# Patient Record
Sex: Female | Born: 1965 | Race: White | Hispanic: No | Marital: Married | Smoking: Never smoker
Health system: Southern US, Community
[De-identification: ages and names within clinical notes are randomized; demographics above are authoritative.]

## PROBLEM LIST (undated history)

## (undated) DIAGNOSIS — R5382 Chronic fatigue, unspecified: Secondary | ICD-10-CM

## (undated) DIAGNOSIS — E663 Overweight: Secondary | ICD-10-CM

## (undated) DIAGNOSIS — M19041 Primary osteoarthritis, right hand: Secondary | ICD-10-CM

## (undated) DIAGNOSIS — M5431 Sciatica, right side: Secondary | ICD-10-CM

## (undated) DIAGNOSIS — M19042 Primary osteoarthritis, left hand: Principal | ICD-10-CM

## (undated) DIAGNOSIS — C801 Malignant (primary) neoplasm, unspecified: Secondary | ICD-10-CM

## (undated) DIAGNOSIS — E785 Hyperlipidemia, unspecified: Secondary | ICD-10-CM

## (undated) DIAGNOSIS — J45909 Unspecified asthma, uncomplicated: Secondary | ICD-10-CM

## (undated) DIAGNOSIS — E039 Hypothyroidism, unspecified: Secondary | ICD-10-CM

## (undated) DIAGNOSIS — E079 Disorder of thyroid, unspecified: Secondary | ICD-10-CM

## (undated) HISTORY — DX: Sciatica, right side: M54.31

## (undated) HISTORY — PX: LEEP: SHX91

## (undated) HISTORY — DX: Primary osteoarthritis, right hand: M19.041

## (undated) HISTORY — PX: FRACTURE SURGERY: SHX138

## (undated) HISTORY — DX: Disorder of thyroid, unspecified: E07.9

## (undated) HISTORY — DX: Hyperlipidemia, unspecified: E78.5

## (undated) HISTORY — DX: Overweight: E66.3

## (undated) HISTORY — DX: Chronic fatigue, unspecified: R53.82

## (undated) HISTORY — PX: JOINT REPLACEMENT: SHX530

## (undated) HISTORY — DX: Primary osteoarthritis, left hand: M19.042

---

## 1994-12-26 HISTORY — PX: MANDIBLE FRACTURE SURGERY: SHX706

## 1999-03-05 ENCOUNTER — Other Ambulatory Visit: Admission: RE | Admit: 1999-03-05 | Discharge: 1999-03-05 | Payer: Self-pay | Admitting: Obstetrics and Gynecology

## 2005-04-15 ENCOUNTER — Ambulatory Visit: Payer: Self-pay | Admitting: Neurology

## 2005-04-18 ENCOUNTER — Ambulatory Visit: Payer: Self-pay | Admitting: Neurology

## 2007-04-12 ENCOUNTER — Ambulatory Visit: Payer: Self-pay | Admitting: Family Medicine

## 2007-05-08 DIAGNOSIS — G9332 Myalgic encephalomyelitis/chronic fatigue syndrome: Secondary | ICD-10-CM

## 2007-05-08 DIAGNOSIS — R5382 Chronic fatigue, unspecified: Secondary | ICD-10-CM

## 2007-05-08 DIAGNOSIS — D8989 Other specified disorders involving the immune mechanism, not elsewhere classified: Secondary | ICD-10-CM

## 2007-05-08 HISTORY — DX: Myalgic encephalomyelitis/chronic fatigue syndrome: G93.32

## 2008-11-26 ENCOUNTER — Ambulatory Visit: Payer: Self-pay | Admitting: Family Medicine

## 2008-12-05 ENCOUNTER — Ambulatory Visit: Payer: Self-pay | Admitting: Family Medicine

## 2010-01-14 ENCOUNTER — Ambulatory Visit: Payer: Self-pay | Admitting: Family Medicine

## 2010-01-19 ENCOUNTER — Ambulatory Visit: Payer: Self-pay | Admitting: Family Medicine

## 2010-07-19 ENCOUNTER — Ambulatory Visit: Payer: Self-pay | Admitting: Family Medicine

## 2011-07-06 ENCOUNTER — Ambulatory Visit: Payer: Self-pay | Admitting: Family Medicine

## 2012-08-30 ENCOUNTER — Ambulatory Visit: Payer: Self-pay | Admitting: Family Medicine

## 2013-02-19 LAB — HM PAP SMEAR: HM Pap smear: NORMAL

## 2014-03-11 LAB — LIPID PANEL
Cholesterol: 237 mg/dL — AB (ref 0–200)
HDL: 48 mg/dL (ref 35–70)
LDL Cholesterol: 132 mg/dL
Triglycerides: 284 mg/dL — AB (ref 40–160)

## 2014-03-11 LAB — HEMOGLOBIN A1C: Hgb A1c MFr Bld: 5.7 % (ref 4.0–6.0)

## 2014-05-27 ENCOUNTER — Ambulatory Visit: Payer: Self-pay | Admitting: Family Medicine

## 2014-05-28 LAB — HM MAMMOGRAPHY: HM Mammogram: NORMAL

## 2014-11-17 ENCOUNTER — Encounter: Payer: Self-pay | Admitting: General Surgery

## 2014-11-26 ENCOUNTER — Ambulatory Visit: Payer: Self-pay | Admitting: General Surgery

## 2014-12-03 ENCOUNTER — Encounter: Payer: Self-pay | Admitting: General Surgery

## 2014-12-03 ENCOUNTER — Ambulatory Visit (INDEPENDENT_AMBULATORY_CARE_PROVIDER_SITE_OTHER): Payer: Managed Care, Other (non HMO) | Admitting: General Surgery

## 2014-12-03 DIAGNOSIS — D1720 Benign lipomatous neoplasm of skin and subcutaneous tissue of unspecified limb: Secondary | ICD-10-CM | POA: Insufficient documentation

## 2014-12-03 HISTORY — DX: Benign lipomatous neoplasm of skin and subcutaneous tissue of unspecified limb: D17.20

## 2014-12-03 NOTE — Patient Instructions (Signed)
The patient is aware to call back for any questions or concerns.  
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
## 2015-07-04 ENCOUNTER — Encounter: Payer: Self-pay | Admitting: Family Medicine

## 2015-07-04 DIAGNOSIS — E663 Overweight: Secondary | ICD-10-CM | POA: Insufficient documentation

## 2015-07-04 DIAGNOSIS — R209 Unspecified disturbances of skin sensation: Secondary | ICD-10-CM | POA: Insufficient documentation

## 2015-07-04 DIAGNOSIS — R232 Flushing: Secondary | ICD-10-CM | POA: Insufficient documentation

## 2015-07-04 DIAGNOSIS — E785 Hyperlipidemia, unspecified: Secondary | ICD-10-CM | POA: Insufficient documentation

## 2015-07-04 DIAGNOSIS — E039 Hypothyroidism, unspecified: Secondary | ICD-10-CM | POA: Insufficient documentation

## 2015-07-04 HISTORY — DX: Unspecified disturbances of skin sensation: R20.9

## 2015-07-07 ENCOUNTER — Ambulatory Visit (INDEPENDENT_AMBULATORY_CARE_PROVIDER_SITE_OTHER): Payer: Managed Care, Other (non HMO) | Admitting: Family Medicine

## 2015-07-07 ENCOUNTER — Encounter: Payer: Self-pay | Admitting: Family Medicine

## 2015-07-07 VITALS — BP 118/72 | HR 70 | Temp 98.4°F | Resp 18 | Ht 66.0 in | Wt 173.5 lb

## 2015-07-07 DIAGNOSIS — E785 Hyperlipidemia, unspecified: Secondary | ICD-10-CM

## 2015-07-07 DIAGNOSIS — R142 Eructation: Secondary | ICD-10-CM | POA: Diagnosis not present

## 2015-07-07 DIAGNOSIS — Z0000 Encounter for general adult medical examination without abnormal findings: Secondary | ICD-10-CM | POA: Diagnosis not present

## 2015-07-07 DIAGNOSIS — Z1239 Encounter for other screening for malignant neoplasm of breast: Secondary | ICD-10-CM

## 2015-07-07 DIAGNOSIS — Z124 Encounter for screening for malignant neoplasm of cervix: Secondary | ICD-10-CM

## 2015-07-07 DIAGNOSIS — R5383 Other fatigue: Secondary | ICD-10-CM

## 2015-07-07 DIAGNOSIS — R141 Gas pain: Secondary | ICD-10-CM

## 2015-07-07 DIAGNOSIS — Z1211 Encounter for screening for malignant neoplasm of colon: Secondary | ICD-10-CM | POA: Diagnosis not present

## 2015-07-07 DIAGNOSIS — R140 Abdominal distension (gaseous): Secondary | ICD-10-CM | POA: Diagnosis not present

## 2015-07-07 DIAGNOSIS — R143 Flatulence: Secondary | ICD-10-CM | POA: Diagnosis not present

## 2015-07-07 DIAGNOSIS — R1031 Right lower quadrant pain: Secondary | ICD-10-CM | POA: Diagnosis not present

## 2015-07-07 DIAGNOSIS — E038 Other specified hypothyroidism: Secondary | ICD-10-CM | POA: Diagnosis not present

## 2015-07-07 DIAGNOSIS — Z01419 Encounter for gynecological examination (general) (routine) without abnormal findings: Secondary | ICD-10-CM

## 2015-07-07 LAB — HM PAP SMEAR: HM PAP: NEGATIVE

## 2015-07-07 MED ORDER — LEVOTHYROXINE SODIUM 100 MCG PO TABS: 100.0000 ug | ORAL_TABLET | Freq: Every day | ORAL | Status: DC

## 2015-07-07 NOTE — Progress Notes (Signed)
Name: Monica Hardin   MRN: [id number]    DOB: February 24, 1966   Date:07/07/2015       Progress Note  Subjective  Chief Complaint  Chief Complaint  Patient presents with  . Annual Exam    HPI  Well Woman: she is still having monthly cycles, but not as heavy and lasts about three days. She has chronic fatigue, but is very concerned about abdominal bloating, and RLQ pain that she has noticed over the past three months. At times it radiates to LLQ and her lower back.  She denies blood in stools and states occasionally gets constipated but not out of ordinary for her. She denies nausea or vomiting. She has been afebrile. She went to Urgent Care about 3 weeks ago and had neg urine culture .  No dysuria, no vaginal discharge.    Patient Active Problem List   Diagnosis Date Noted  . Dyslipidemia 07/04/2015  . Adult hypothyroidism 07/04/2015  . Excess weight 07/04/2015  . Female climacteric state 07/04/2015  . Disturbance of skin sensation 07/04/2015  . Lipoma of arm 12/03/2014  . CFIDS (chronic fatigue and immune dysfunction syndrome) 05/08/2007    History reviewed. No pertinent past surgical history.  Family History  Problem Relation Age of Onset  . Cancer Mother   . Cancer Father   . Kidney Stones Daughter   . Asthma Son     History   Social History  . Marital Status: Married    Spouse Name: Monica Hardin  . Number of Children: Monica Hardin  . Years of Education: Monica Hardin   Occupational History  . Not on file.   Social History Main Topics  . Smoking status: Never Smoker   . Smokeless tobacco: Never Used  . Alcohol Use: No  . Drug Use: No  . Sexual Activity: Yes   Other Topics Concern  . Not on file   Social History Narrative     Current outpatient prescriptions:  .  levothyroxine (SYNTHROID, LEVOTHROID) 100 MCG tablet, Take 100 mcg by mouth daily before breakfast. , Disp: , Rfl:  .  MULTIPLE VITAMINS PO, Take 1 tablet by mouth daily., Disp: , Rfl:   No Known  Allergies   ROS  Constitutional: Negative for fever or weight change. Fatigue Respiratory: Negative for cough and shortness of breath.   Cardiovascular: Negative for chest pain or palpitations.  Gastrointestinal: Positive  for abdominal pain, no bowel changes.  Musculoskeletal: Negative for gait problem or joint swelling.  Skin: Negative for rash.  Neurological: Negative for dizziness or headache.  No other specific complaints in a complete review of systems (except as listed in HPI above).  Objective  Filed Vitals:   07/07/15 1204  BP: 118/72  Pulse: 70  Temp: 98.4 F (36.9 C)  TempSrc: Oral  Resp: 18  Height: 5\' 6"  (1.676 m)  Weight: 173 lb 8 oz (78.699 kg)  SpO2: 93%    Body mass index is 28.02 kg/(m^2).  Physical Exam  Constitutional: Patient appears well-developed and well-nourished. No distress.  HENT: Head: Normocephalic and atraumatic. Ears: B TMs ok, no erythema or effusion; Nose: Nose normal. Mouth/Throat: Oropharynx is clear and moist. No oropharyngeal exudate.  Eyes: Conjunctivae and EOM are normal. Pupils are equal, round, and reactive to light. No scleral icterus.  Neck: Normal range of motion. Neck supple. No JVD present. No thyromegaly present.  Cardiovascular: Normal rate, regular rhythm and normal heart sounds.  No murmur heard. No BLE edema. Pulmonary/Chest: Effort normal and breath sounds normal.  No respiratory distress. Abdominal: Soft. Bowel sounds are normal, no distension. There is no tenderness. no masses Breast: no lumps or masses, no nipple discharge or rashes FEMALE GENITALIA:  External genitalia normal External urethra normal Vaginal vault normal without discharge or lesions Cervix normal without discharge or lesions Bimanual exam pain during palpation of RLQ but no  masses RECTAL: not done Musculoskeletal: Normal range of motion, no joint effusions. No gross deformities Neurological: he is alert and oriented to person, place, and time. No  cranial nerve deficit. Coordination, balance, strength, speech and gait are normal.  Skin: Skin is warm and dry. No rash noted. No erythema.  Psychiatric: Patient has a normal mood and affect. behavior is normal. Judgment and thought content normal.    PHQ2/9: Depression screen PHQ 2/9 07/07/2015  Decreased Interest 0  Down, Depressed, Hopeless 0  PHQ - 2 Score 0   *  Fall Risk: Fall Risk  07/07/2015  Falls in the past year? No      Assessment & Plan   1. Well woman exam Discussed importance of 150 minutes of physical activity weekly, eat two servings of fish weekly, eat one serving of tree nuts ( cashews, pistachios, pecans, almonds.Marland Kitchen) every other day, eat 6 servings of fruit/vegetables daily and drink plenty of water and avoid sweet beverages.   2. Abdominal bloating Discussed possible diagnosis with patient if pelvic US negative we will order CT abdomen and pelvis, she will also need a colonoscopy  - US Pelvis Complete; Future  3. Right lower quadrant pain  - US Transvaginal Non-OB; Future  4. Cervical cancer screening  - Cytology - PAP  5. Colon cancer screening  - Ambulatory referral to Gastroenterology  6. Breast cancer screening -mammogram  7. Dyslipidemia  - Lipid Profile  8. Other specified hypothyroidism  - TSH  9. Other fatigue  - Comprehensive Metabolic Panel (CMET) - HgB A1c - CBC with Differential - Vitamin D (25 hydroxy)

## 2015-07-07 NOTE — Addendum Note (Signed)
Addended by: Steele Sizer F on: 07/07/2015 03:30 PM   Modules accepted: Miquel Dunn

## 2015-07-08 ENCOUNTER — Other Ambulatory Visit: Payer: Self-pay

## 2015-07-08 DIAGNOSIS — R102 Pelvic and perineal pain unspecified side: Secondary | ICD-10-CM

## 2015-07-10 ENCOUNTER — Ambulatory Visit
Admission: RE | Admit: 2015-07-10 | Discharge: 2015-07-10 | Disposition: A | Discharge status: Home / Self Care | Payer: Managed Care, Other (non HMO) | Source: Ambulatory Visit | Attending: Family Medicine | Admitting: Family Medicine

## 2015-07-10 DIAGNOSIS — D259 Leiomyoma of uterus, unspecified: Secondary | ICD-10-CM | POA: Diagnosis not present

## 2015-07-10 DIAGNOSIS — R102 Pelvic and perineal pain unspecified side: Secondary | ICD-10-CM

## 2015-07-10 DIAGNOSIS — N852 Hypertrophy of uterus: Secondary | ICD-10-CM | POA: Insufficient documentation

## 2015-07-10 DIAGNOSIS — R140 Abdominal distension (gaseous): Secondary | ICD-10-CM

## 2015-07-11 LAB — CBC WITH DIFFERENTIAL/PLATELET
Basophils Absolute: 0 10*3/uL (ref 0.0–0.2)
Basos: 0 %
EOS (ABSOLUTE): 0.2 10*3/uL (ref 0.0–0.4)
Eos: 3 %
Hematocrit: 41.7 % (ref 34.0–46.6)
Hemoglobin: 13.8 g/dL (ref 11.1–15.9)
IMMATURE GRANS (ABS): 0 10*3/uL (ref 0.0–0.1)
IMMATURE GRANULOCYTES: 0 %
Lymphocytes Absolute: 2.5 10*3/uL (ref 0.7–3.1)
Lymphs: 41 %
MCH: 30.1 pg (ref 26.6–33.0)
MCHC: 33.1 g/dL (ref 31.5–35.7)
MCV: 91 fL (ref 79–97)
MONOCYTES: 5 %
MONOS ABS: 0.3 10*3/uL (ref 0.1–0.9)
Neutrophils Absolute: 3.1 10*3/uL (ref 1.4–7.0)
Neutrophils: 51 %
PLATELETS: 370 10*3/uL (ref 150–379)
RBC: 4.58 x10E6/uL (ref 3.77–5.28)
RDW: 13.3 % (ref 12.3–15.4)
WBC: 6.1 10*3/uL (ref 3.4–10.8)

## 2015-07-11 LAB — COMPREHENSIVE METABOLIC PANEL
ALT: 22 IU/L (ref 0–32)
AST: 22 IU/L (ref 0–40)
Albumin/Globulin Ratio: 2 (ref 1.1–2.5)
Albumin: 4.7 g/dL (ref 3.5–5.5)
Alkaline Phosphatase: 64 IU/L (ref 39–117)
BILIRUBIN TOTAL: 0.3 mg/dL (ref 0.0–1.2)
BUN / CREAT RATIO: 12 (ref 9–23)
BUN: 10 mg/dL (ref 6–24)
CHLORIDE: 102 mmol/L (ref 97–108)
CO2: 23 mmol/L (ref 18–29)
Calcium: 9.8 mg/dL (ref 8.7–10.2)
Creatinine, Ser: 0.86 mg/dL (ref 0.57–1.00)
GFR calc Af Amer: 92 mL/min/{1.73_m2} (ref >59)
GFR calc non Af Amer: 80 mL/min/{1.73_m2} (ref >59)
Globulin, Total: 2.4 g/dL (ref 1.5–4.5)
Glucose: 84 mg/dL (ref 65–99)
POTASSIUM: 4.6 mmol/L (ref 3.5–5.2)
SODIUM: 142 mmol/L (ref 134–144)
Total Protein: 7.1 g/dL (ref 6.0–8.5)

## 2015-07-11 LAB — LIPID PANEL
Chol/HDL Ratio: 4.7 ratio units — ABNORMAL HIGH (ref 0.0–4.4)
Cholesterol, Total: 226 mg/dL — ABNORMAL HIGH (ref 100–199)
HDL: 48 mg/dL (ref >39)
LDL Calculated: 139 mg/dL — ABNORMAL HIGH (ref 0–99)
Triglycerides: 195 mg/dL — ABNORMAL HIGH (ref 0–149)
VLDL Cholesterol Cal: 39 mg/dL (ref 5–40)

## 2015-07-11 LAB — VITAMIN D 25 HYDROXY (VIT D DEFICIENCY, FRACTURES): VIT D 25 HYDROXY: 23.9 ng/mL — AB (ref 30.0–100.0)

## 2015-07-11 LAB — TSH: TSH: 3.37 u[IU]/mL (ref 0.450–4.500)

## 2015-07-11 LAB — HEMOGLOBIN A1C
Est. average glucose Bld gHb Est-mCnc: 111 mg/dL
Hgb A1c MFr Bld: 5.5 % (ref 4.8–5.6)

## 2015-07-12 ENCOUNTER — Other Ambulatory Visit: Payer: Self-pay | Admitting: Family Medicine

## 2015-07-12 DIAGNOSIS — D251 Intramural leiomyoma of uterus: Secondary | ICD-10-CM

## 2015-07-13 ENCOUNTER — Telehealth: Payer: Self-pay

## 2015-07-13 NOTE — Progress Notes (Signed)
Patient notified of labs and Pelvis US. Would like to see [facility] soon as possible due to the pain with the Uterine Fibroids.

## 2015-07-13 NOTE — Telephone Encounter (Signed)
-----   Message from Steele Sizer, MD sent at 07/12/2015  2:20 PM EDT ----- She has uterine fibroids but no ovarian masses, left side normal and on the left obscured - not seen - I will refer her to gyn for further evaluation

## 2015-07-13 NOTE — Telephone Encounter (Signed)
Left vm for patient to return call for labs and ultrasound.

## 2015-07-14 ENCOUNTER — Telehealth: Payer: Self-pay | Admitting: Family Medicine

## 2015-07-14 NOTE — Telephone Encounter (Signed)
Needing clarification. Would like to know if you are going to call [facility] to schedule her appointment or are they going to call you/her to make the appointment.

## 2015-07-14 NOTE — Telephone Encounter (Signed)
Patient notified that her referral was sent to [facility] and they usually give patients a call. But gave her their phone number to call and schedule a appointment.

## 2015-07-21 ENCOUNTER — Encounter (INDEPENDENT_AMBULATORY_CARE_PROVIDER_SITE_OTHER): Payer: Managed Care, Other (non HMO) | Admitting: Obstetrics and Gynecology

## 2015-07-22 ENCOUNTER — Ambulatory Visit (INDEPENDENT_AMBULATORY_CARE_PROVIDER_SITE_OTHER): Payer: Managed Care, Other (non HMO) | Admitting: Obstetrics and Gynecology

## 2015-07-22 ENCOUNTER — Encounter: Payer: Self-pay | Admitting: Obstetrics and Gynecology

## 2015-07-22 VITALS — BP 119/82 | HR 76 | Ht 66.0 in | Wt 170.1 lb

## 2015-07-22 DIAGNOSIS — R102 Pelvic and perineal pain: Secondary | ICD-10-CM

## 2015-07-22 DIAGNOSIS — D259 Leiomyoma of uterus, unspecified: Secondary | ICD-10-CM

## 2015-07-22 DIAGNOSIS — N939 Abnormal uterine and vaginal bleeding, unspecified: Secondary | ICD-10-CM | POA: Diagnosis not present

## 2015-07-22 MED ORDER — DOXYCYCLINE HYCLATE 50 MG PO CAPS
50.0000 mg | ORAL_CAPSULE | Freq: Two times a day (BID) | ORAL | Status: DC
Start: 2015-07-22 — End: 2015-07-22

## 2015-07-22 MED ORDER — OXYCODONE-ACETAMINOPHEN 5-325 MG PO TABS: 1.0000 | ORAL_TABLET | Freq: Four times a day (QID) | ORAL | Status: DC | PRN

## 2015-07-22 MED ORDER — NYSTATIN-TRIAMCINOLONE 100000-0.1 UNIT/GM-% EX OINT
1.0000 | TOPICAL_OINTMENT | Freq: Two times a day (BID) | CUTANEOUS | Status: DC
Start: 2015-07-22 — End: 2015-10-14

## 2015-07-22 MED ORDER — DOXYCYCLINE HYCLATE 100 MG PO TABS: 100.0000 mg | ORAL_TABLET | Freq: Two times a day (BID) | ORAL | Status: DC

## 2015-07-22 NOTE — Patient Instructions (Addendum)
1.  Tylenol and Motrin for pain. 2.  Percocet prescription, 30 tablets given for severe pain. 3.  Endometrial biopsy is completed and we will review results.  On follow-up appointment. 4.  Doxycycline 100 mg twice a day-antibiotic for possible infectious source/cause of pelvic pain. 5.  Apply Mycolog-II cream to vulva twice a day for 14 days for yeast infection 5.  Return in 3 weeks for follow-up

## 2015-07-22 NOTE — Progress Notes (Signed)
Patient ID: Monica Hardin, female   DOB: [birth year], 49 y.o.   MRN: [id number] Having pelvic and has multiple fibroids. Had ultrasound on 07/10/15. Had AUB 2-3x's past 6 months.  GYN ENCOUNTER NOTE - NEW PATIENT EVALUATION  Subjective:       Monica Hardin is a 49 y.o. [id number] female is here for gynecologic evaluation of the following issues:  1. Pelvic pain. 2.  Uterine fibroids. 3.  Anxiety.   Patient is having monthly cycles, but not as heavy and lasts about three days. She has chronic fatigue, but is very concerned about abdominal bloating, and RLQ pain that she has noticed over the past three months. At times it radiates to LLQ and her lower back. She denies blood in stools and states occasionally gets constipated but not out of ordinary for her. She denies nausea or vomiting. She has been afebrile. She went to Urgent Care about 3 weeks ago and had neg urine culture . No dysuria, no vaginal discharge.   Ultrasound on 07/10/2015 was notable for multiple fibroids measuring 2.5, 3.1, and 1.4 cm, respectively.The endometrium was measuring 9 mm and appeared vascular.  The adnexa were normal in appearance.  Gynecologic History Patient's last menstrual period was 07/07/2015.   Obstetric History OB History  Gravida Para Term Preterm AB SAB TAB Ectopic Multiple Living  3 2 2  1 1    2     # Outcome Date GA Lbr Len/2nd Weight Sex Delivery Anes PTL Lv  3 Term 12/1996    M Vag-Spont   Y  2 Term 08/1995    F Vag-Spont   Y  1 SAB             Obstetric Comments  1st Menstrual Cycle:   ?  1st Pregnancy:  55    Past Medical History  Diagnosis Date  . Thyroid disease   . Hyperthyroidism   . Overweight   . Hyperlipidemia   . Chronic fatigue     Past Surgical History  Procedure Laterality Date  . Leep  [id number]    [facility]    Current Outpatient Prescriptions on File Prior to Visit  Medication Sig Dispense Refill  . levothyroxine (SYNTHROID, LEVOTHROID) 100 MCG tablet Take 1 tablet  (100 mcg total) by mouth daily before breakfast. 90 tablet 1  . MULTIPLE VITAMINS PO Take 1 tablet by mouth daily.     No current facility-administered medications on file prior to visit.    No Known Allergies  History   Social History  . Marital Status: Married    Spouse Name: N/A  . Number of Children: N/A  . 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   Social History Main Topics  . Smoking status: Never Smoker   . Smokeless tobacco: Never Used  . Alcohol Use: No  . Drug Use: No  . Sexual Activity:    Partners: Male   Other Topics Concern  . Not on file   Social History Narrative    Family History  Problem Relation Age of Onset  . Lung cancer Mother   . Lung cancer Father   . Kidney Stones Daughter   . Asthma Son     The following portions of the patient's history were reviewed and updated as appropriate: allergies, current medications, past family history, past medical history, past social history, past surgical history and problem list.  Review of Systems Per HPI  Objective:   BP 119/82 mmHg  Pulse 76  Ht 5\' 6"  (1.676 m)  Wt 170 lb 1 oz (77.14 kg)  BMI 27.46 kg/m2  LMP 07/07/2015 CONSTITUTIONAL: Well-developed, well-nourished female in no acute distress.  HENT:  Normocephalic, atraumatic.  NECK: Normal range of motion, supple, no masses.  Normal thyroid.  SKIN: Skin is warm and dry. No rash noted. Not diaphoretic. No erythema. No pallor. [place]: Alert and oriented to person, place, and time. PSYCHIATRIC: Normal mood and affect. Normal behavior. Normal judgment and thought content. CARDIOVASCULAR:Not Examined RESPIRATORY: Not Examined BREASTS: Not Examined ABDOMEN: Soft, non distended; Non tender.  No Organomegaly. PELVIC:  External Genitalia: Symmetric vulvar and groin.  Monilial rash present  BUS: Normal  Vagina: Normal  Cervix: Normal; mild cervical motion tenderness  Uterus: enlarged working weeks size colon, mobile;  slightly tender.  Adnexa: Normal  RV: Normal External exam  Bladder: Nontender MUSCULOSKELETAL: Normal range of motion. No tenderness.  No cyanosis, clubbing, or edema.  Endometrial Biopsy Procedure Note  Pre-operative Diagnosis: Abnormal uterine bleeding  Post-operative Diagnosis: same  Indications: abnormal uterine bleeding  Procedure Details   Urine pregnancy test was not done.  The risks (including infection, bleeding, pain, and uterine perforation) and benefits of the procedure were explained to the patient and Verbal informed consent was obtained.  Antibiotic prophylaxis against endocarditis was not indicated.   The patient was placed in the dorsal lithotomy position.  Bimanual exam showed the uterus to be in the neutral position.  A Graves' speculum inserted in the vagina, and the cervix prepped with povidone iodine.  Endocervical curettage with a Kevorkian curette was not performed.   A single-toothed tenaculum was applied to the anterior lip of the cervix for stabilization.  A sterile pipette biopsy was performed with sounding of the uterine cavity to 9 cm.  Endometrial  Sample was sent for pathologic examination.  Condition: Stable  Complications: None  Plan:  The patient was advised to call for any fever or for prolonged or severe pain or bleeding. She was advised to use OTC analgesics as needed for mild to moderate pain. She was advised to avoid vaginal intercourse for 48 hours or until the bleeding has completely stopped.  Attending Physician Documentation: [name], MD      Assessment:   1. Pelvic pain in female  2. Uterine leiomyoma, unspecified location  3.  Abnormal uterine bleeding  4.  Monilial vulvitis     Plan:   1.Endometrial biopsy completed 2.  Doxycycline 100 mg twice a day for 14 days to rule out possible infectious etiology.  2.  Pain. 3.  Percocet and NSAID for pain relief. 4.  Return in 3 weeks for follow-up and further  management planning. 5.  Patient understands that I resolution because the amount of discomfort.  Patient is experiencing.  There is a possibility she could have adenomyosis/endometriosis contributing to the problem as well.  We will discuss further evaluation including possible laparoscopy, hysteroscopy/D&C, and hysterectomy for management of this condition. 6.  Mycolog-II cream twice a day for 14 days  [name], MD

## 2015-07-23 LAB — PATHOLOGY

## 2015-07-24 DIAGNOSIS — N939 Abnormal uterine and vaginal bleeding, unspecified: Secondary | ICD-10-CM | POA: Insufficient documentation

## 2015-08-13 ENCOUNTER — Encounter: Payer: Self-pay | Admitting: Obstetrics and Gynecology

## 2015-08-13 ENCOUNTER — Ambulatory Visit (INDEPENDENT_AMBULATORY_CARE_PROVIDER_SITE_OTHER): Payer: Managed Care, Other (non HMO) | Admitting: Obstetrics and Gynecology

## 2015-08-13 VITALS — BP 125/73 | HR 73 | Ht 66.0 in | Wt 172.1 lb

## 2015-08-13 DIAGNOSIS — N939 Abnormal uterine and vaginal bleeding, unspecified: Secondary | ICD-10-CM | POA: Diagnosis not present

## 2015-08-13 DIAGNOSIS — D259 Leiomyoma of uterus, unspecified: Secondary | ICD-10-CM

## 2015-08-13 DIAGNOSIS — R102 Pelvic and perineal pain unspecified side: Secondary | ICD-10-CM

## 2015-08-13 DIAGNOSIS — N809 Endometriosis, unspecified: Secondary | ICD-10-CM | POA: Diagnosis not present

## 2015-08-13 DIAGNOSIS — B3731 Acute candidiasis of vulva and vagina: Secondary | ICD-10-CM

## 2015-08-13 DIAGNOSIS — B373 Candidiasis of vulva and vagina: Secondary | ICD-10-CM | POA: Diagnosis not present

## 2015-08-13 NOTE — Patient Instructions (Addendum)
1.  Scheduled laparoscopy with biopsies. 2.  Use Mycolog twice a day 14 days for Vulvar rash

## 2015-08-13 NOTE — Progress Notes (Signed)
Patient ID: Monica Hardin, female   DOB: 11-Jun-1966, 49 y.o.   MRN: [id number] 3 week f/u on pelvic pain and aub Completed doxycyline Did not get rx for mycolog Taking percocet as needed emb neg  Chief complaint: 1.  Pelvic pain. 2.  Abnormal uterine bleeding/menorrhagia. 3.  Monilial vulvitis.  Previous note summary: Patient is having monthly cycles, but not as heavy and lasts about three days. She has chronic fatigue, but is very concerned about abdominal bloating, and RLQ pain that she has noticed over the past three months. At times it radiates to LLQ and her lower back. She denies blood in stools and states occasionally gets constipated but not out of ordinary for her. She denies nausea or vomiting. She has been afebrile. She went to Urgent Care about 3 weeks ago and had neg urine culture . No dysuria, no vaginal discharge.   Ultrasound on 07/10/2015 was notable for multiple fibroids measuring 2.5, 3.1, and 1.4 cm, respectively.The endometrium was measuring 9 mm and appeared vascular. The adnexa were normal in appearance.  UPDATE: Patient underwent endometrial biopsy; pathology: Benign. Patient completed a 14 day course of doxycycline without significant change in pelvic pain. Patient desires surgical workup at this time.  OBJECTIVE: BP 125/73 mmHg  Pulse 73  Ht 5\' 6"  (1.676 m)  Wt 172 lb 1.6 oz (78.064 kg)  BMI 27.79 kg/m2  LMP 08/03/2015 Patient is a pleasant, well-appearing white female in no acute distress. Back: No CVA tenderness. Abdomen: Soft, mild bilateral lower quadrant tenderness without peritoneal signs; fibroid uterus palpable above symphysis pubis, 2/4 tender. Pelvic exam:  External genitalia: Bilateral inguinal rash consistent with monilia persists (patient did not use Mycolog).  BUS: Normal.  Vagina: Normal  Cervix: No lesions; 2/4.  Cervical motion tenderness.  Uterus: Enlarged 12-14 weeks, mobile, 2/4 tender   Adnexa: Nonpalpable, mild bilateral  tenderness.  Rectovaginal: Normal.  External exam.    IMPRESSION: 1.  Chronic pelvic pain, refractory to medical therapy.  (Antibiotics). 2.  Ultrasound demonstrates multiple fibroids. 3.  Benign endometrial biopsy. 4.  Etiology likely related to endometriosis/adenomyosis. 5.  Patient desires surgical evaluation. 6.  Options of medical management were explained.  PLAN: 1.  Medical, surgical workup reviewed. 2.  Schedule laparoscopy with peritoneal biopsies to rule out endometriosis  A total of 25 minutes were spent face-to-face with the patient during this encounter and over half of that time involved counseling and coordination of care.  [name], MD]

## 2015-08-19 ENCOUNTER — Encounter: Payer: Self-pay | Admitting: Obstetrics and Gynecology

## 2015-08-19 ENCOUNTER — Ambulatory Visit (INDEPENDENT_AMBULATORY_CARE_PROVIDER_SITE_OTHER): Payer: Managed Care, Other (non HMO) | Admitting: Obstetrics and Gynecology

## 2015-08-19 ENCOUNTER — Encounter
Admission: RE | Admit: 2015-08-19 | Discharge: 2015-08-19 | Disposition: A | Discharge status: Home / Self Care | Payer: Managed Care, Other (non HMO) | Source: Ambulatory Visit | Attending: Obstetrics and Gynecology | Admitting: Obstetrics and Gynecology

## 2015-08-19 VITALS — BP 109/72 | HR 66 | Ht 66.0 in | Wt 174.7 lb

## 2015-08-19 DIAGNOSIS — G8929 Other chronic pain: Secondary | ICD-10-CM

## 2015-08-19 DIAGNOSIS — D259 Leiomyoma of uterus, unspecified: Secondary | ICD-10-CM

## 2015-08-19 DIAGNOSIS — Z01818 Encounter for other preprocedural examination: Secondary | ICD-10-CM

## 2015-08-19 DIAGNOSIS — Z01812 Encounter for preprocedural laboratory examination: Secondary | ICD-10-CM | POA: Insufficient documentation

## 2015-08-19 DIAGNOSIS — N949 Unspecified condition associated with female genital organs and menstrual cycle: Secondary | ICD-10-CM

## 2015-08-19 DIAGNOSIS — R102 Pelvic and perineal pain: Secondary | ICD-10-CM

## 2015-08-19 HISTORY — DX: Hypothyroidism, unspecified: E03.9

## 2015-08-19 LAB — CBC WITH DIFFERENTIAL/PLATELET
BASOS ABS: 0 10*3/uL (ref 0–0.1)
Basophils Relative: 0 %
Eosinophils Absolute: 0.1 10*3/uL (ref 0–0.7)
Eosinophils Relative: 1 %
HEMATOCRIT: 39.3 % (ref 35.0–47.0)
Hemoglobin: 13.1 g/dL (ref 12.0–16.0)
LYMPHS PCT: 29 %
Lymphs Abs: 2.4 10*3/uL (ref 1.0–3.6)
MCH: 30.5 pg (ref 26.0–34.0)
MCHC: 33.2 g/dL (ref 32.0–36.0)
MCV: 91.9 fL (ref 80.0–100.0)
MONOS PCT: 4 %
Monocytes Absolute: 0.3 10*3/uL (ref 0.2–0.9)
NEUTROS ABS: 5.2 10*3/uL (ref 1.4–6.5)
Neutrophils Relative %: 66 %
Platelets: 329 10*3/uL (ref 150–440)
RBC: 4.28 MIL/uL (ref 3.80–5.20)
RDW: 12.9 % (ref 11.5–14.5)
WBC: 8 10*3/uL (ref 3.6–11.0)

## 2015-08-19 LAB — SURGICAL PCR SCREEN
MRSA, PCR: NEGATIVE
Staphylococcus aureus: NEGATIVE

## 2015-08-19 LAB — RAPID HIV SCREEN (HIV 1/2 AB+AG)
HIV 1/2 Antibodies: NONREACTIVE
HIV-1 P24 Antigen - HIV24: NONREACTIVE

## 2015-08-19 NOTE — Progress Notes (Signed)
Patient ID: Monica Hardin, female   DOB: 09/02/1966, 49 y.o.   MRN: [id number]    Subjective:  PREOPERATIVE HISTORY AND PHYSICAL  Date of surgery: 08/24/2015 Chief complaint: 1. Pelvic pain. 2. Abnormal uterine bleeding/menorrhagia.  The patient is a 49 year old female who presents for surgical evaluation of chronic pelvic pain. Summary: Patient is having monthly cycles, but not as heavy and lasts about three days. She has chronic fatigue, but is very concerned about abdominal bloating, and RLQ pain that she has noticed over the past three months. At times it radiates to LLQ and her lower back. She denies blood in stools and states occasionally gets constipated but not out of ordinary for her. She denies nausea or vomiting. She has been afebrile. She went to Urgent Care about 3 weeks ago and had neg urine culture . No dysuria, no vaginal discharge.   Ultrasound on 07/10/2015 was notable for multiple fibroids measuring 2.5, 3.1, and 1.4 cm, respectively.The endometrium was measuring 9 mm and appeared vascular. The adnexa were normal in appearance.  UPDATE: Patient underwent endometrial biopsy; pathology: Benign. Patient completed a 14 day course of doxycycline without significant change in pelvic pain.  Patient desires surgical workup at this time.  OB History    Gravida Para Term Preterm AB TAB SAB Ectopic Multiple Living   3 2 2  1  1   2       Obstetric Comments   1st Menstrual Cycle: ? 1st Pregnancy: 5      Menarche age: NA Patient's last menstrual period was 08/03/2015.    Past Medical History  Diagnosis Date  . Thyroid disease   . Hyperthyroidism   . Overweight   . Hyperlipidemia   . Chronic fatigue     Past Surgical History  Procedure Laterality Date  . Leep  [id number]    [facility]    OB History  Gravida Para Term Preterm AB SAB TAB Ectopic Multiple Living  3 2 2  1 1     2     # Outcome Date GA Lbr Len/2nd Weight Sex Delivery Anes PTL Lv  3 Term 12/1996    M Vag-Spont   Y  2 Term 08/1995    F Vag-Spont   Y  1 SAB             Obstetric Comments  1st Menstrual Cycle: ?  1st Pregnancy: 73    Social History   Social History  . Marital Status: Married    Spouse Name: N/A  . Number of Children: N/A  . 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   Social History Main Topics  . Smoking status: Never Smoker   . Smokeless tobacco: Never Used  . Alcohol Use: No  . Drug Use: No  . Sexual Activity:    Partners: Male   Other Topics Concern  . None   Social History Narrative    Family History  Problem Relation Age of Onset  . Lung cancer Mother   . Lung cancer Father   . Kidney Stones Daughter   . Asthma Son      (Not in a hospital admission)  No Known Allergies  Review of Systems Constitutional: No recent fever/chills/sweats Respiratory: No recent cough/bronchitis Cardiovascular: No chest pain Gastrointestinal: No recent nausea/vomiting/diarrhea Genitourinary: No UTI symptoms Hematologic/lymphatic:No history of coagulopathy or recent blood thinner use   Objective:    BP 109/72 mmHg  Pulse 66  Ht 5\' 6"  (1.676  m)  Wt 174 lb 11.2 oz (79.243 kg)  BMI 28.21 kg/m2  LMP 08/03/2015  General:  Normal  Skin:  normal  HEENT: Normal  Neck: Supple without Adenopathy or Thyromegaly  Lungs:   Heart:    Breasts:   Abdomen:  Pelvis:  M/S   Extremeties:  Neuro:   clear to auscultation bilaterally  Normal without murmur  Not Examined  soft, non-tender; bowel sounds normal; no masses, no organomegaly  Exam deferred to OR  No CVAT  Warm/Dry   Normal          Assessment:     1. Chronic pelvic pain. 2. History of  uterine fibroids. 3. Cannot rule out endometriosis  Plan:  Laparoscopy with peritoneal biopsies    Counseling: Procedure, risks, reasons, benefits and complications (including injury to bowel, bladder, major blood vessel, ureter, bleeding, possibility of transfusion, infection, or fistula formation) reviewed in detail. Consent signed. Preop testing ordered. Instructions reviewed, including NPO after midnight.

## 2015-08-19 NOTE — H&P (Signed)
Subjective:  PREOPERATIVE HISTORY AND PHYSICAL  Date of surgery: 08/24/2015 Chief complaint: 1. Pelvic pain. 2. Abnormal uterine bleeding/menorrhagia.  The patient is a 49 year old female who presents for surgical evaluation of chronic pelvic pain. Summary: Patient is having monthly cycles, but not as heavy and lasts about three days. She has chronic fatigue, but is very concerned about abdominal bloating, and RLQ pain that she has noticed over the past three months. At times it radiates to LLQ and her lower back. She denies blood in stools and states occasionally gets constipated but not out of ordinary for her. She denies nausea or vomiting. She has been afebrile. She went to Urgent Care about 3 weeks ago and had neg urine culture . No dysuria, no vaginal discharge.   Ultrasound on 07/10/2015 was notable for multiple fibroids measuring 2.5, 3.1, and 1.4 cm, respectively.The endometrium was measuring 9 mm and appeared vascular. The adnexa were normal in appearance.  UPDATE: Patient underwent endometrial biopsy; pathology: Benign. Patient completed a 14 day course of doxycycline without significant change in pelvic pain.  Patient desires surgical workup at this time.  OB History    Gravida Para Term Preterm AB TAB SAB Ectopic Multiple Living   3 2 2  1  1   2       Obstetric Comments   1st Menstrual Cycle:   ? 1st Pregnancy:  40      Menarche age: NA Patient's last menstrual period was 08/03/2015.    Past Medical History  Diagnosis Date  . Thyroid disease   . Hyperthyroidism   . Overweight   . Hyperlipidemia   . Chronic fatigue     Past Surgical History  Procedure Laterality Date  . Leep  [id number]    [facility]    OB History  Gravida Para Term Preterm AB SAB TAB Ectopic Multiple Living  3 2 2  1 1    2     # Outcome Date GA Lbr Len/2nd Weight Sex Delivery Anes PTL Lv  3 Term 12/1996    M Vag-Spont   Y  2 Term 08/1995    F Vag-Spont   Y  1 SAB              Obstetric Comments  1st Menstrual Cycle:   ?  1st Pregnancy:  2    Social History   Social History  . Marital Status: Married    Spouse Name: N/A  . Number of Children: N/A  . 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   Social History Main Topics  . Smoking status: Never Smoker   . Smokeless tobacco: Never Used  . Alcohol Use: No  . Drug Use: No  . Sexual Activity:    Partners: Male   Other Topics Concern  . None   Social History Narrative    Family History  Problem Relation Age of Onset  . Lung cancer Mother   . Lung cancer Father   . Kidney Stones Daughter   . Asthma Son      (Not in a hospital admission)  No Known Allergies  Review of Systems Constitutional: No recent fever/chills/sweats Respiratory: No recent cough/bronchitis Cardiovascular: No chest pain Gastrointestinal: No recent nausea/vomiting/diarrhea Genitourinary: No UTI symptoms Hematologic/lymphatic:No history of coagulopathy or recent blood thinner use    Objective:    BP 109/72 mmHg  Pulse 66  Ht 5\' 6"  (1.676 m)  Wt 174 lb 11.2 oz (79.243 kg)  BMI  28.21 kg/m2  LMP 08/03/2015  General:   Normal  Skin:   normal  HEENT:  Normal  Neck:  Supple without Adenopathy or Thyromegaly  Lungs:   Heart:              Breasts:   Abdomen:  Pelvis:  M/S   Extremeties:  Neuro:    clear to auscultation bilaterally   Normal without murmur   Not Examined   soft, non-tender; bowel sounds normal; no masses,  no organomegaly   Exam deferred to OR  No CVAT  Warm/Dry   Normal          Assessment:      1.  Chronic pelvic pain. 2.  History of uterine fibroids. 3.  Cannot rule out endometriosis  Plan:  Laparoscopy with peritoneal biopsies    Counseling: Procedure, risks, reasons, benefits and complications (including injury to bowel, bladder, major blood vessel, ureter, bleeding, possibility of transfusion, infection, or fistula formation) reviewed in detail.  Consent signed. Preop testing ordered. Instructions reviewed, including NPO after midnight.

## 2015-08-19 NOTE — Patient Instructions (Signed)
  Your procedure is scheduled on: 08/24/15 Report to Day Surgery. [facility] To find out your arrival time please call [phone] between 1PM - 3PM on 08/21/15.  Remember: Instructions that are not followed completely may result in serious medical risk, up to and including death, or upon the discretion of your surgeon and anesthesiologist your surgery may need to be rescheduled.    _X___ 1. Do not eat food or drink liquids after midnight. No gum chewing or hard candies.     _X___ 2. No Alcohol for 24 hours before or after surgery.   ____ 3. Bring all medications with you on the day of surgery if instructed.    __X__ 4. Notify your doctor if there is any change in your medical condition     (cold, fever, infections).     Do not wear jewelry, make-up, hairpins, clips or nail polish.  Do not wear lotions, powders, or perfumes. You may wear deodorant.  Do not shave 48 hours prior to surgery. Men may shave face and neck.  Do not bring valuables to the hospital.    [facility] is not responsible for any belongings or valuables.               Contacts, dentures or bridgework may not be worn into surgery.  Leave your suitcase in the car. After surgery it may be brought to your room.  For patients admitted to the hospital, discharge time is determined by your                treatment team.   Patients discharged the day of surgery will not be allowed to drive home.   Please read over the following fact sheets that you were given:   Surgical Site Infection Prevention   ____ Take these medicines the morning of surgery with A SIP OF WATER:    1. LEVOTHYROXINE  2.   3.   4.  5.  6.  ____ Fleet Enema (as directed)   ____ Use CHG Soap as directed  ____ Use inhalers on the day of surgery  ____ Stop metformin 2 days prior to surgery    ____ Take 1/2 of usual insulin dose the night before surgery and none on the morning of surgery.   ____ Stop Coumadin/Plavix/aspirin  on  ____ Stop Anti-inflammatories on   ____ Stop supplements until after surgery.    ____ Bring C-Pap to the hospital.

## 2015-08-19 NOTE — Progress Notes (Signed)
Patient ID: Monica Hardin, female   DOB: [birth year], 49 y.o.   MRN: [id number] PRE-OP  LAP WITH BX -8/29 cpp

## 2015-08-20 LAB — RPR: RPR: NONREACTIVE

## 2015-08-24 ENCOUNTER — Encounter: Payer: Self-pay | Admitting: *Deleted

## 2015-08-24 ENCOUNTER — Encounter
Admission: RE | Disposition: A | Discharge status: Home / Self Care | Payer: Self-pay | Source: Ambulatory Visit | Attending: Obstetrics and Gynecology

## 2015-08-24 ENCOUNTER — Ambulatory Visit
Admission: RE | Admit: 2015-08-24 | Discharge: 2015-08-24 | Disposition: A | Discharge status: Home / Self Care | Payer: Managed Care, Other (non HMO) | Source: Ambulatory Visit | Attending: Obstetrics and Gynecology | Admitting: Obstetrics and Gynecology

## 2015-08-24 ENCOUNTER — Ambulatory Visit: Payer: Managed Care, Other (non HMO) | Admitting: Certified Registered"

## 2015-08-24 DIAGNOSIS — N736 Female pelvic peritoneal adhesions (postinfective): Secondary | ICD-10-CM | POA: Diagnosis not present

## 2015-08-24 DIAGNOSIS — N939 Abnormal uterine and vaginal bleeding, unspecified: Secondary | ICD-10-CM | POA: Insufficient documentation

## 2015-08-24 DIAGNOSIS — D270 Benign neoplasm of right ovary: Secondary | ICD-10-CM | POA: Diagnosis not present

## 2015-08-24 DIAGNOSIS — Z8489 Family history of other specified conditions: Secondary | ICD-10-CM | POA: Insufficient documentation

## 2015-08-24 DIAGNOSIS — N8320 Unspecified ovarian cysts: Secondary | ICD-10-CM | POA: Diagnosis not present

## 2015-08-24 DIAGNOSIS — N949 Unspecified condition associated with female genital organs and menstrual cycle: Secondary | ICD-10-CM | POA: Diagnosis not present

## 2015-08-24 DIAGNOSIS — Z79899 Other long term (current) drug therapy: Secondary | ICD-10-CM | POA: Insufficient documentation

## 2015-08-24 DIAGNOSIS — Z801 Family history of malignant neoplasm of trachea, bronchus and lung: Secondary | ICD-10-CM | POA: Diagnosis not present

## 2015-08-24 DIAGNOSIS — G8929 Other chronic pain: Secondary | ICD-10-CM | POA: Diagnosis present

## 2015-08-24 DIAGNOSIS — E079 Disorder of thyroid, unspecified: Secondary | ICD-10-CM | POA: Insufficient documentation

## 2015-08-24 DIAGNOSIS — D279 Benign neoplasm of unspecified ovary: Secondary | ICD-10-CM

## 2015-08-24 DIAGNOSIS — D259 Leiomyoma of uterus, unspecified: Secondary | ICD-10-CM | POA: Insufficient documentation

## 2015-08-24 DIAGNOSIS — E785 Hyperlipidemia, unspecified: Secondary | ICD-10-CM | POA: Diagnosis not present

## 2015-08-24 DIAGNOSIS — N839 Noninflammatory disorder of ovary, fallopian tube and broad ligament, unspecified: Secondary | ICD-10-CM | POA: Diagnosis not present

## 2015-08-24 DIAGNOSIS — E663 Overweight: Secondary | ICD-10-CM | POA: Diagnosis not present

## 2015-08-24 DIAGNOSIS — N920 Excessive and frequent menstruation with regular cycle: Secondary | ICD-10-CM | POA: Diagnosis not present

## 2015-08-24 DIAGNOSIS — Z825 Family history of asthma and other chronic lower respiratory diseases: Secondary | ICD-10-CM | POA: Insufficient documentation

## 2015-08-24 DIAGNOSIS — Z79891 Long term (current) use of opiate analgesic: Secondary | ICD-10-CM | POA: Insufficient documentation

## 2015-08-24 DIAGNOSIS — R102 Pelvic and perineal pain: Secondary | ICD-10-CM | POA: Insufficient documentation

## 2015-08-24 HISTORY — PX: LAPAROSCOPY: SHX197

## 2015-08-24 LAB — POCT PREGNANCY, URINE: PREG TEST UR: NEGATIVE

## 2015-08-24 SURGERY — LAPAROSCOPY, DIAGNOSTIC
Anesthesia: General

## 2015-08-24 MED ORDER — LACTATED RINGERS IV SOLN
INTRAVENOUS | Status: DC
  Administered 2015-08-24: 125 mL/h via INTRAVENOUS
  Administered 2015-08-24: 10:00:00 via INTRAVENOUS

## 2015-08-24 MED ORDER — SUGAMMADEX SODIUM 200 MG/2ML IV SOLN
INTRAVENOUS | Status: DC | PRN
  Administered 2015-08-24: 200 mg via INTRAVENOUS

## 2015-08-24 MED ORDER — ONDANSETRON HCL 4 MG/2ML IJ SOLN: 4.0000 mg | Freq: Once | INTRAMUSCULAR | Status: DC | PRN

## 2015-08-24 MED ORDER — ROCURONIUM BROMIDE 100 MG/10ML IV SOLN
INTRAVENOUS | Status: DC | PRN
  Administered 2015-08-24: 40 mg via INTRAVENOUS

## 2015-08-24 MED ORDER — FENTANYL CITRATE (PF) 100 MCG/2ML IJ SOLN
INTRAMUSCULAR | Status: DC | PRN
  Administered 2015-08-24 (×2): 50 ug via INTRAVENOUS

## 2015-08-24 MED ORDER — HYDROMORPHONE HCL 1 MG/ML IJ SOLN: 0.2500 mg | INTRAMUSCULAR | Status: DC | PRN

## 2015-08-24 MED ORDER — MIDAZOLAM HCL 2 MG/2ML IJ SOLN
INTRAMUSCULAR | Status: DC | PRN
  Administered 2015-08-24: 2 mg via INTRAVENOUS

## 2015-08-24 MED ORDER — PROPOFOL 10 MG/ML IV BOLUS
INTRAVENOUS | Status: DC | PRN
  Administered 2015-08-24: 170 mg via INTRAVENOUS

## 2015-08-24 MED ORDER — ONDANSETRON HCL 4 MG/2ML IJ SOLN
INTRAMUSCULAR | Status: DC | PRN
Start: 2015-08-24 — End: 2015-08-24
  Administered 2015-08-24: 4 mg via INTRAVENOUS

## 2015-08-24 MED ORDER — LIDOCAINE HCL (CARDIAC) 20 MG/ML IV SOLN
INTRAVENOUS | Status: DC | PRN
  Administered 2015-08-24: 100 mg via INTRAVENOUS

## 2015-08-24 MED ORDER — IBUPROFEN 800 MG PO TABS: 800.0000 mg | ORAL_TABLET | Freq: Three times a day (TID) | ORAL | Status: DC

## 2015-08-24 MED ORDER — OXYCODONE-ACETAMINOPHEN 5-325 MG PO TABS: 1.0000 | ORAL_TABLET | ORAL | Status: DC | PRN

## 2015-08-24 MED ORDER — DEXAMETHASONE SODIUM PHOSPHATE 4 MG/ML IJ SOLN
INTRAMUSCULAR | Status: DC | PRN
  Administered 2015-08-24: 10 mg via INTRAVENOUS

## 2015-08-24 SURGICAL SUPPLY — 30 items
BLADE SURG SZ11 CARB STEEL (BLADE) ×3 IMPLANT
BNDG ADH 2 X3.75 FABRIC TAN LF (GAUZE/BANDAGES/DRESSINGS) ×9 IMPLANT
BNDG ADH XL 3.75X2 STRCH LF (GAUZE/BANDAGES/DRESSINGS) ×3
CANISTER SUCT 1200ML W/VALVE (MISCELLANEOUS) ×3 IMPLANT
CATH ROBINSON RED A/P 16FR (CATHETERS) ×3 IMPLANT
CHLORAPREP W/TINT 26ML (MISCELLANEOUS) ×3 IMPLANT
GLOVE BIO SURGEON STRL SZ8 (GLOVE) ×3 IMPLANT
GLOVE INDICATOR 8.0 STRL GRN (GLOVE) ×3 IMPLANT
GOWN STRL REUS W/ TWL LRG LVL3 (GOWN DISPOSABLE) ×1 IMPLANT
GOWN STRL REUS W/ TWL XL LVL3 (GOWN DISPOSABLE) ×1 IMPLANT
GOWN STRL REUS W/TWL LRG LVL3 (GOWN DISPOSABLE) ×3
GOWN STRL REUS W/TWL XL LVL3 (GOWN DISPOSABLE) ×3
IRRIGATION STRYKERFLOW (MISCELLANEOUS) ×1 IMPLANT
IRRIGATOR STRYKERFLOW (MISCELLANEOUS) ×3
IV LACTATED RINGERS 1000ML (IV SOLUTION) ×3 IMPLANT
KIT RM TURNOVER CYSTO AR (KITS) ×3 IMPLANT
LABEL OR SOLS (LABEL) IMPLANT
NS IRRIG 1000ML POUR BTL (IV SOLUTION) ×3 IMPLANT
NS IRRIG 500ML POUR BTL (IV SOLUTION) ×3 IMPLANT
PACK GYN LAPAROSCOPIC (MISCELLANEOUS) ×3 IMPLANT
PAD OB MATERNITY 4.3X12.25 (PERSONAL CARE ITEMS) ×3 IMPLANT
PAD PREP 24X41 OB/GYN DISP (PERSONAL CARE ITEMS) ×3 IMPLANT
POUCH ENDO CATCH 10MM SPEC (MISCELLANEOUS) IMPLANT
SCISSORS METZENBAUM CVD 33 (INSTRUMENTS) IMPLANT
SLEEVE ENDOPATH XCEL 5M (ENDOMECHANICALS) ×3 IMPLANT
SUT PLAIN 4 0 FS 2 27 (SUTURE) ×5 IMPLANT
SUT VIC AB 0 CT2 27 (SUTURE) ×1 IMPLANT
SUT VIC AB 0 UR5 27 (SUTURE) ×1 IMPLANT
TROCAR XCEL NON-BLD 5MMX100MML (ENDOMECHANICALS) ×3 IMPLANT
TUBING INSUFFLATOR HI FLOW (MISCELLANEOUS) ×3 IMPLANT

## 2015-08-24 NOTE — Transfer of Care (Signed)
Immediate Anesthesia Transfer of Care Note  Patient: Monica Hardin  Procedure(s) Performed: Procedure(s): LAPAROSCOPY DIAGNOSTIC (N/A)  Patient Location: PACU  Anesthesia Type:General  Level of Consciousness: awake, alert , oriented and patient cooperative  Airway & Oxygen Therapy: Patient Spontanous Breathing and Patient connected to face mask oxygen  Post-op Assessment: Report given to RN, Post -op Vital signs reviewed and stable and Patient moving all extremities X 4  Post vital signs: Reviewed and stable  Last Vitals:  Filed Vitals:   08/24/15 1014  BP: 129/92  Pulse: 75  Temp: 36.3 C  Resp: 13    Complications: No apparent anesthesia complications

## 2015-08-24 NOTE — Anesthesia Preprocedure Evaluation (Signed)
Anesthesia Evaluation  Patient identified by MRN, date of birth, ID band Patient awake    Reviewed: Allergy & Precautions, NPO status , Patient's Chart, lab work & pertinent test results  Airway Mallampati: II  TM Distance: >3 FB Neck ROM: Full    Dental  (+) Teeth Intact   Pulmonary    Pulmonary exam normal       Cardiovascular Exercise Tolerance: Good Normal cardiovascular exam    Neuro/Psych    GI/Hepatic   Endo/Other  Hypothyroidism   Renal/GU      Musculoskeletal   Abdominal (+)  Abdomen: soft.    Peds  Hematology   Anesthesia Other Findings   Reproductive/Obstetrics                             Anesthesia Physical Anesthesia Plan  ASA: II  Anesthesia Plan: General   Post-op Pain Management:    Induction: Intravenous  Airway Management Planned: Oral ETT  Additional Equipment:   Intra-op Plan:   Post-operative Plan: Extubation in OR  Informed Consent: I have reviewed the patients History and Physical, chart, labs and discussed the procedure including the risks, benefits and alternatives for the proposed anesthesia with the patient or authorized representative who has indicated his/her understanding and acceptance.     Plan Discussed with: CRNA  Anesthesia Plan Comments:         Anesthesia Quick Evaluation

## 2015-08-24 NOTE — OR Nursing (Signed)
patient c/o having something in her throat taking sips  And drinks of water tolerated well

## 2015-08-24 NOTE — Discharge Instructions (Signed)

## 2015-08-24 NOTE — H&P (Addendum)
H&P   Monica Hardin (MR# [id number])      H&P Info    Author Note Status Last Update User Last Update Date/Time   [name], MD Signed [name], MD 08/19/2015 11:55 AM    H&P    Expand All Collapse All     Subjective:  PREOPERATIVE HISTORY AND PHYSICAL  Date of surgery: 08/24/2015 Chief complaint: 1. Pelvic pain. 2. Abnormal uterine bleeding/menorrhagia.  The patient is a 49 year old female who presents for surgical evaluation of chronic pelvic pain. Summary: Patient is having monthly cycles, but not as heavy and lasts about three days. She has chronic fatigue, but is very concerned about abdominal bloating, and RLQ pain that she has noticed over the past three months. At times it radiates to LLQ and her lower back. She denies blood in stools and states occasionally gets constipated but not out of ordinary for her. She denies nausea or vomiting. She has been afebrile. She went to Urgent Care about 3 weeks ago and had neg urine culture . No dysuria, no vaginal discharge.   Ultrasound on 07/10/2015 was notable for multiple fibroids measuring 2.5, 3.1, and 1.4 cm, respectively.The endometrium was measuring 9 mm and appeared vascular. The adnexa were normal in appearance.  UPDATE: Patient underwent endometrial biopsy; pathology: Benign. Patient completed a 14 day course of doxycycline without significant change in pelvic pain.  Patient desires surgical workup at this time.  OB History    Gravida Para Term Preterm AB TAB SAB Ectopic Multiple Living   3 2 2  1  1   2       Obstetric Comments   1st Menstrual Cycle: ? 1st Pregnancy: 35      Menarche age: NA Patient's last menstrual period was 08/03/2015.    Past Medical History  Diagnosis Date  . Thyroid disease   . Hyperthyroidism   . Overweight   . Hyperlipidemia   . Chronic fatigue     Past Surgical History  Procedure  Laterality Date  . Leep  [id number]    [facility]    OB History  Gravida Para Term Preterm AB SAB TAB Ectopic Multiple Living  3 2 2  1 1    2     # Outcome Date GA Lbr Len/2nd Weight Sex Delivery Anes PTL Lv  3 Term 12/1996    M Vag-Spont   Y  2 Term 08/1995    F Vag-Spont   Y  1 SAB             Obstetric Comments  1st Menstrual Cycle: ?  1st Pregnancy: 22    Social History   Social History  . Marital Status: Married    Spouse Name: N/A  . Number of Children: N/A  . 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   Social History Main Topics  . Smoking status: Never Smoker   . Smokeless tobacco: Never Used  . Alcohol Use: No  . Drug Use: No  . Sexual Activity:    Partners: Male   Other Topics Concern  . None   Social History Narrative    Family History  Problem Relation Age of Onset  . Lung cancer Mother   . Lung cancer Father   . Kidney Stones Daughter   . Asthma Son      (Not in a hospital admission)  No Known Allergies  Review of Systems Constitutional: No recent fever/chills/sweats Respiratory: No recent cough/bronchitis Cardiovascular:  No chest pain Gastrointestinal: No recent nausea/vomiting/diarrhea Genitourinary: No UTI symptoms Hematologic/lymphatic:No history of coagulopathy or recent blood thinner use   Objective:    BP 109/72 mmHg  Pulse 66  Ht 5\' 6"  (1.676 m)  Wt 174 lb 11.2 oz (79.243 kg)  BMI 28.21 kg/m2  LMP 08/03/2015  General:  Normal  Skin:  normal  HEENT: Normal  Neck: Supple without Adenopathy or Thyromegaly  Lungs:   Heart:    Breasts:   Abdomen:  Pelvis:  M/S   Extremeties:  Neuro:   clear to auscultation bilaterally  Normal without murmur  Not Examined  soft, non-tender; bowel sounds normal; no  masses, no organomegaly  Exam deferred to OR  No CVAT  Warm/Dry   Normal          Assessment:     1. Chronic pelvic pain. 2. History of uterine fibroids. 3. Cannot rule out endometriosis  Plan:  Laparoscopy with peritoneal biopsies    Counseling: Procedure, risks, reasons, benefits and complications (including injury to bowel, bladder, major blood vessel, ureter, bleeding, possibility of transfusion, infection, or fistula formation) reviewed in detail. Consent signed. Preop testing ordered. Instructions reviewed, including NPO after midnight.          Date of Initial H&P: 08/19/2015  History reviewed, patient examined, no change in status, stable for surgery.

## 2015-08-24 NOTE — Anesthesia Procedure Notes (Signed)
Procedure Name: Intubation Date/Time: 08/24/2015 9:22 AM Performed by: Silvana Newness Pre-anesthesia Checklist: Patient identified, Emergency Drugs available, Suction available, Patient being monitored and Timeout performed Patient Re-evaluated:Patient Re-evaluated prior to inductionOxygen Delivery Method: Circle system utilized Preoxygenation: Pre-oxygenation with 100% oxygen Intubation Type: IV induction Ventilation: Mask ventilation without difficulty Laryngoscope Size: Mac and 3 Grade View: Grade I Tube type: Oral Tube size: 7.0 mm Number of attempts: 1 Airway Equipment and Method: Rigid stylet Placement Confirmation: ETT inserted through vocal cords under direct vision,  positive ETCO2 and breath sounds checked- equal and bilateral Secured at: 19 cm Tube secured with: Tape Dental Injury: Teeth and Oropharynx as per pre-operative assessment

## 2015-08-24 NOTE — Anesthesia Postprocedure Evaluation (Signed)
  Anesthesia Post-op Note  Patient: Monica Hardin  Procedure(s) Performed: Procedure(s): LAPAROSCOPY DIAGNOSTIC (N/A)  Anesthesia type:General  Patient location: PACU  Post pain: Pain level controlled  Post assessment: Post-op Vital signs reviewed, Patient's Cardiovascular Status Stable, Respiratory Function Stable, Patent Airway and No signs of Nausea or vomiting  Post vital signs: Reviewed and stable  Last Vitals:  Filed Vitals:   08/24/15 1015  BP:   Pulse: 81  Temp:   Resp: 20    Level of consciousness: awake, alert  and patient cooperative  Complications: No apparent anesthesia complications

## 2015-08-24 NOTE — Op Note (Signed)
Monica Hardin PROCEDURE DATE: 08/24/2015 10:01 AM  PREOPERATIVE DIAGNOSIS: chronic pelvic pain, abnormal uterine bleeding  POSTOPERATIVE DIAGNOSIS: pelvic adhesive disease, fibroid uterus, right ovarian teratoma, left ovarian cyst PROCEDURE: Procedure(s): LAPAROSCOPY DIAGNOSTIC (N/A) SURGEON:  Dr. Hassell Done A Defrancesco ASSISTANT: none ANESTHESIA: General Endotracheal  INDICATIONS: 49 y.o. [id number] with history of chronic pelvic pain concerning for endometriosis desiring surgical evaluation.   Please see preoperative notes for further details.   FINDINGS:   1. 12 week size fibroid uterus 2. Right ovarian teratoma 5 cm 3. Bilobed 3 cm left ovarian cyst. 4. Omental/uterine/right adnexal adhesions, was not lysed 5. No obvious stigmata of endometriosis 6. Normal upper abdomen including liver and gallbladder   I/O's: Total I/O In: 600 [I.V.:600] Out: 100 [Urine:100] SPECIMENS: None COMPLICATIONS: None immediate COUNTS:  YES  PROCEDURE IN DETAIL: The patient was brought to the operating room where she was placed in the supine position.  General endotracheal anesthesia was induced without difficulty.  She was placed in the dorsal lithotomy position using bumblebee stirrups.  A ChloraPrep and Betadine, abdominal, perineal, intravaginal prep and drape was performed in standard fashion.  The timeout was completed.  The red Robison catheter was used to drain the bladder of clear urine.  A weighted speculum was placed into the vagina and a Hulka tenaculum was placed onto the cervix to facilitate uterine manipulation. The pelvis is gynecoid. Uterus is approximately 12 weeks' size and mobile. Laparoscopy was performed in standard fashion.  The Optiview 5 mm trocar and sleeve were placed through a 5 mm subumbilical incision directly into the abdominal pelvic cavity, without evidence of bowel or vascular injury. One Additional 5 mm port was placed in the right lower quadrant, under direct visualization.   The above noted findings were photo documented. No biopsies were taken. Upon completion of the procedures and inspection for hemostasis, the surgery was complete with all instrumentation being removed from the abdominal pelvic cavity.  The pneumoperitoneum was released.  The incisions were closed with 4 0 plain suture. Telfa and Tegaderm dressings were placed. The patient was awakened, extubated, and taken to the recovery room in satisfactory condition.  [name], MD [facility]

## 2015-09-02 ENCOUNTER — Encounter: Payer: Self-pay | Admitting: Obstetrics and Gynecology

## 2015-09-02 ENCOUNTER — Ambulatory Visit (INDEPENDENT_AMBULATORY_CARE_PROVIDER_SITE_OTHER): Payer: Managed Care, Other (non HMO) | Admitting: Obstetrics and Gynecology

## 2015-09-02 VITALS — BP 140/77 | HR 88 | Ht 66.0 in | Wt 173.3 lb

## 2015-09-02 DIAGNOSIS — D259 Leiomyoma of uterus, unspecified: Secondary | ICD-10-CM

## 2015-09-02 DIAGNOSIS — D369 Benign neoplasm, unspecified site: Secondary | ICD-10-CM

## 2015-09-02 DIAGNOSIS — Z09 Encounter for follow-up examination after completed treatment for conditions other than malignant neoplasm: Secondary | ICD-10-CM

## 2015-09-02 NOTE — Patient Instructions (Addendum)
1.  Resume activities as tolerated. 2.  TAH/BSO to be scheduled.

## 2015-09-02 NOTE — Progress Notes (Signed)
Patient ID: Monica Hardin, female   DOB: [birth year], 49 y.o.   MRN: [id number] 1 week post op- s/p dx lap Using only ibup as needed Period is heavier than normal No issues with incision  Chief complaint: 1.  One week postop check 2.  Status post diagnostic laparoscopy.  Patient presents for follow-up after surgery for evaluation of pelvic pain to rule out endometriosis.  Findings from surgery were reviewed, which include the following: 1.  12 week fibroid uterus. 2.  Pelvic adhesions. 3.  Right dermoid cyst. 4.  Left ovary bilobed cyst, unclear etiology, possible dermoid versus corpus luteum  IMPRESSION: 1.  Fibroid uterus. 2.  Dermoid cyst. 3.  Pelvic adhesive disease.  PLAN: 1.  Findings reviewed and discussed. 2.  Schedule TAH/BSO through a Pfannenstiel incision  [name], MD

## 2015-10-01 ENCOUNTER — Encounter
Admission: RE | Admit: 2015-10-01 | Discharge: 2015-10-01 | Disposition: A | Discharge status: Home / Self Care | Payer: Managed Care, Other (non HMO) | Source: Ambulatory Visit | Attending: Obstetrics and Gynecology | Admitting: Obstetrics and Gynecology

## 2015-10-01 ENCOUNTER — Ambulatory Visit (INDEPENDENT_AMBULATORY_CARE_PROVIDER_SITE_OTHER): Payer: Managed Care, Other (non HMO) | Admitting: Obstetrics and Gynecology

## 2015-10-01 ENCOUNTER — Encounter: Payer: Self-pay | Admitting: Obstetrics and Gynecology

## 2015-10-01 VITALS — BP 119/77 | HR 62 | Ht 66.0 in | Wt 174.4 lb

## 2015-10-01 DIAGNOSIS — R102 Pelvic and perineal pain: Secondary | ICD-10-CM

## 2015-10-01 DIAGNOSIS — D259 Leiomyoma of uterus, unspecified: Secondary | ICD-10-CM | POA: Insufficient documentation

## 2015-10-01 DIAGNOSIS — D369 Benign neoplasm, unspecified site: Secondary | ICD-10-CM

## 2015-10-01 DIAGNOSIS — Z01818 Encounter for other preprocedural examination: Secondary | ICD-10-CM | POA: Insufficient documentation

## 2015-10-01 LAB — CBC WITH DIFFERENTIAL/PLATELET
BASOS PCT: 1 %
Basophils Absolute: 0 10*3/uL (ref 0–0.1)
EOS ABS: 0.1 10*3/uL (ref 0–0.7)
Eosinophils Relative: 2 %
HEMATOCRIT: 40.5 % (ref 35.0–47.0)
Hemoglobin: 13.4 g/dL (ref 12.0–16.0)
LYMPHS ABS: 2.3 10*3/uL (ref 1.0–3.6)
Lymphocytes Relative: 32 %
MCH: 30.5 pg (ref 26.0–34.0)
MCHC: 33.2 g/dL (ref 32.0–36.0)
MCV: 91.9 fL (ref 80.0–100.0)
MONO ABS: 0.3 10*3/uL (ref 0.2–0.9)
MONOS PCT: 4 %
Neutro Abs: 4.5 10*3/uL (ref 1.4–6.5)
Neutrophils Relative %: 61 %
Platelets: 334 10*3/uL (ref 150–440)
RBC: 4.4 MIL/uL (ref 3.80–5.20)
RDW: 13.2 % (ref 11.5–14.5)
WBC: 7.3 10*3/uL (ref 3.6–11.0)

## 2015-10-01 LAB — RAPID HIV SCREEN (HIV 1/2 AB+AG)
HIV 1/2 ANTIBODIES: NONREACTIVE
HIV-1 P24 ANTIGEN - HIV24: NONREACTIVE

## 2015-10-01 LAB — TYPE AND SCREEN
ABO/RH(D): A POS
ANTIBODY SCREEN: NEGATIVE

## 2015-10-01 LAB — ABO/RH: ABO/RH(D): A POS

## 2015-10-01 NOTE — Patient Instructions (Signed)
  Your procedure is scheduled on: October 05, 2015(Monday) Report to Day Surgery.[facility]) To find out your arrival time please call [phone] between 1PM - 3PM on October 02, 2015(Friday).  Remember: Instructions that are not followed completely may result in serious medical risk, up to and including death, or upon the discretion of your surgeon and anesthesiologist your surgery may need to be rescheduled.    __x__ 1. Do not eat food or drink liquids after midnight. No gum chewing or hard candies.     __x__ 2. No Alcohol for 24 hours before or after surgery.   ____ 3. Bring all medications with you on the day of surgery if instructed.    __x__ 4. Notify your doctor if there is any change in your medical condition     (cold, fever, infections).     Do not wear jewelry, make-up, hairpins, clips or nail polish.  Do not wear lotions, powders, or perfumes. You may wear deodorant.  Do not shave 48 hours prior to surgery. Men may shave face and neck.  Do not bring valuables to the hospital.    [facility] is not responsible for any belongings or valuables.               Contacts, dentures or bridgework may not be worn into surgery.  Leave your suitcase in the car. After surgery it may be brought to your room.  For patients admitted to the hospital, discharge time is determined by your                treatment team.   Patients discharged the day of surgery will not be allowed to drive home.   Please read over the following fact sheets that you were given:   Surgical Site Infection Prevention   ____ Take these medicines the morning of surgery with A SIP OF WATER:    1. Levothyroxine  2.   3.   4.  5.  6.  ____ Fleet Enema (as directed)   _x___ Use CHG Soap as directed  ____ Use inhalers on the day of surgery  ____ Stop metformin 2 days prior to surgery    ____ Take 1/2 of usual insulin dose the night before surgery and none on the morning of surgery.   ____ Stop  Coumadin/Plavix/aspirin on   ____ Stop Anti-inflammatories on (Tylenol ok to take for pain if needed)   ____ Stop supplements until after surgery.    ____ Bring C-Pap to the hospital.

## 2015-10-01 NOTE — Progress Notes (Signed)
Preoperative H&P completed. Counseling is done.

## 2015-10-01 NOTE — H&P (Signed)
Subjective:  PREOPERATIVE HISTORY AND PHYSICAL   Date of surgery: 10/05/2015 Chief complaint: 1.  Chronic pelvic pain. 2.  Right ovarian dermoid cyst   Patient is a 49 y.o. G3P207female scheduled for Total abdominal hysterectomy, bilateral salpingo-oophorectomy. Indications for procedure are Chronic pelvic pain and right dermoid cyst. Summary: Patient is having monthly cycles, but not as heavy and lasts about three days. She has chronic fatigue, but is very concerned about abdominal bloating, and RLQ pain that she has noticed over the past three months. At times it radiates to LLQ and her lower back. She denies blood in stools and states occasionally gets constipated but not out of ordinary for her. She denies nausea or vomiting. She has been afebrile.  Patient underwent laparoscopy for evaluation of chronic pelvic pain  In August 2016.   Findings were notabe for pelvic adhesive disease in the upper abdomen, a 5 cm right dermoid cyst, and a bilobed left ovarian cyst of unclear etiology.  No stigmata of endometriosis was noted. Because of the patient's chronic pelvic pain syndrome and the pathology identified at the time of laparoscopy, she was counseled to undergo exploratory laparotomy with TAH/BSO.   Discussed Blood/Blood Products: yes   Menstrual History: OB History    Gravida Para Term Preterm AB TAB SAB Ectopic Multiple Living   3 2 2  1  1   2       Obstetric Comments   1st Menstrual Cycle:   ? 1st Pregnancy:  69      Menarche age: na Patient's last menstrual period was 10/01/2015.    Past Medical History  Diagnosis Date  . Thyroid disease   . Overweight   . Hyperlipidemia   . Chronic fatigue   . Hypothyroidism     Past Surgical History  Procedure Laterality Date  . Leep  [phone]    [facility]  . Mandible fracture surgery  1996  . Laparoscopy N/A 08/24/2015    Procedure: LAPAROSCOPY DIAGNOSTIC;  Surgeon: [name], MD;  Location: [facility] ORS;  Service:  Gynecology;  Laterality: N/A;    OB History  Gravida Para Term Preterm AB SAB TAB Ectopic Multiple Living  3 2 2  1 1    2     # Outcome Date GA Lbr Len/2nd Weight Sex Delivery Anes PTL Lv  3 Term 12/1996    M Vag-Spont   Y  2 Term 08/1995    F Vag-Spont   Y  1 SAB             Obstetric Comments  1st Menstrual Cycle:   ?  1st Pregnancy:  55    Social History   Social History  . Marital Status: Married    Spouse Name: N/A  . Number of Children: N/A  . 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   Social History Main Topics  . Smoking status: Never Smoker   . Smokeless tobacco: Never Used  . Alcohol Use: No  . Drug Use: No  . Sexual Activity:    Partners: Male   Other Topics Concern  . None   Social History Narrative    Family History  Problem Relation Age of Onset  . Lung cancer Mother   . Lung cancer Father   . Kidney Stones Daughter   . Asthma Son      (Not in a hospital admission)  No Known Allergies  Review of Systems Constitutional: No recent fever/chills/sweats Respiratory: No recent  cough/bronchitis Cardiovascular: No chest pain Gastrointestinal: No recent nausea/vomiting/diarrhea Genitourinary: No UTI symptoms Hematologic/lymphatic:No history of coagulopathy or recent blood thinner use    Objective:    BP 119/77 mmHg  Pulse 62  Ht 5\' 6"  (1.676 m)  Wt 174 lb 7 oz (79.124 kg)  BMI 28.17 kg/m2  LMP 10/01/2015  General:   Normal  Skin:   normal  HEENT:  Normal  Neck:  Supple without Adenopathy or Thyromegaly  Lungs:   Heart:              Breasts:   Abdomen:  Pelvis:  M/S   Extremeties:  Neuro:    clear to auscultation bilaterally   Normal without murmur   Not Examined   soft, non-tender; bowel sounds normal; no masses,  no organomegaly   Exam deferred to OR  No CVAT  Warm/Dry   Normal          Assessment:    1.  Chronic pelvic pain. 2.  Right dermoid cyst,, 5 cm 3.  Bilobed left ovarian cyst,  unclear etiology. 4.  Upper abdominal pevic adhesive disease  Plan:   1.  TAH/BSO through a Pfannenstiel incision.  Preoperative counseling: The patient is to undergo TAH/BSO through a Pfannenstiel incision for management of chronic pelvic pain and a recently identified right  ovarian dermoid cyst.  The patient is understanding of the planned procedure and is aware of and icepting of all surgical risks which include but are not limited to bleeding, infection, pelvic organ injury with need for repair, blood clot disorders, anesthesia risks, Etc.  All questions have been answered.  Informed consent is given.  Patient is ready and willing to proceed with surgery as scheduled.    [name], MD

## 2015-10-02 LAB — RPR: RPR: NONREACTIVE

## 2015-10-05 ENCOUNTER — Encounter: Payer: Self-pay | Admitting: *Deleted

## 2015-10-05 ENCOUNTER — Encounter
Admission: RE | Disposition: A | Discharge status: Home / Self Care | Payer: Self-pay | Source: Ambulatory Visit | Attending: Obstetrics and Gynecology

## 2015-10-05 ENCOUNTER — Inpatient Hospital Stay: Payer: Managed Care, Other (non HMO) | Admitting: Anesthesiology

## 2015-10-05 ENCOUNTER — Inpatient Hospital Stay
Admission: RE | Admit: 2015-10-05 | Discharge: 2015-10-07 | DRG: 743 | Disposition: A | Discharge status: Home / Self Care | Payer: Managed Care, Other (non HMO) | Source: Ambulatory Visit | Attending: Obstetrics and Gynecology | Admitting: Obstetrics and Gynecology

## 2015-10-05 DIAGNOSIS — Z801 Family history of malignant neoplasm of trachea, bronchus and lung: Secondary | ICD-10-CM | POA: Diagnosis not present

## 2015-10-05 DIAGNOSIS — G8929 Other chronic pain: Secondary | ICD-10-CM | POA: Diagnosis present

## 2015-10-05 DIAGNOSIS — Z6828 Body mass index (BMI) 28.0-28.9, adult: Secondary | ICD-10-CM | POA: Diagnosis not present

## 2015-10-05 DIAGNOSIS — E663 Overweight: Secondary | ICD-10-CM | POA: Diagnosis present

## 2015-10-05 DIAGNOSIS — D259 Leiomyoma of uterus, unspecified: Secondary | ICD-10-CM | POA: Diagnosis present

## 2015-10-05 DIAGNOSIS — Z9889 Other specified postprocedural states: Secondary | ICD-10-CM

## 2015-10-05 DIAGNOSIS — R102 Pelvic and perineal pain: Secondary | ICD-10-CM

## 2015-10-05 DIAGNOSIS — Z825 Family history of asthma and other chronic lower respiratory diseases: Secondary | ICD-10-CM

## 2015-10-05 DIAGNOSIS — N736 Female pelvic peritoneal adhesions (postinfective): Secondary | ICD-10-CM | POA: Diagnosis present

## 2015-10-05 DIAGNOSIS — E039 Hypothyroidism, unspecified: Secondary | ICD-10-CM | POA: Diagnosis present

## 2015-10-05 DIAGNOSIS — D270 Benign neoplasm of right ovary: Principal | ICD-10-CM | POA: Diagnosis present

## 2015-10-05 HISTORY — PX: SALPINGOOPHORECTOMY: SHX82

## 2015-10-05 HISTORY — PX: ABDOMINAL HYSTERECTOMY: SHX81

## 2015-10-05 SURGERY — HYSTERECTOMY, ABDOMINAL
Anesthesia: General

## 2015-10-05 MED ORDER — LIDOCAINE 5 % EX PTCH
1.0000 | MEDICATED_PATCH | CUTANEOUS | Status: DC
  Administered 2015-10-06: 1 via TRANSDERMAL
  Filled 2015-10-05: qty 1

## 2015-10-05 MED ORDER — CEFAZOLIN SODIUM-DEXTROSE 2-3 GM-% IV SOLR
INTRAVENOUS | Status: AC
  Administered 2015-10-05: 2 g via INTRAVENOUS
  Filled 2015-10-05: qty 50

## 2015-10-05 MED ORDER — ONDANSETRON HCL 4 MG/2ML IJ SOLN: 4.0000 mg | Freq: Four times a day (QID) | INTRAMUSCULAR | Status: DC | PRN

## 2015-10-05 MED ORDER — HYDROMORPHONE 0.3 MG/ML IV SOLN
INTRAVENOUS | Status: DC
  Administered 2015-10-05: 14:00:00 via INTRAVENOUS
  Filled 2015-10-05: qty 25

## 2015-10-05 MED ORDER — DEXAMETHASONE SODIUM PHOSPHATE 4 MG/ML IJ SOLN
INTRAMUSCULAR | Status: DC | PRN
  Administered 2015-10-05: 5 mg via INTRAVENOUS

## 2015-10-05 MED ORDER — KETOROLAC TROMETHAMINE 30 MG/ML IJ SOLN
30.0000 mg | Freq: Four times a day (QID) | INTRAMUSCULAR | Status: DC
  Administered 2015-10-05: 30 mg via INTRAVENOUS
  Filled 2015-10-05: qty 1

## 2015-10-05 MED ORDER — SODIUM CHLORIDE 0.9 % IJ SOLN: 9.0000 mL | INTRAMUSCULAR | Status: DC | PRN

## 2015-10-05 MED ORDER — MORPHINE SULFATE (PF) 2 MG/ML IV SOLN
1.0000 mg | INTRAVENOUS | Status: DC | PRN
  Administered 2015-10-05: 2 mg via INTRAVENOUS
  Filled 2015-10-05: qty 1

## 2015-10-05 MED ORDER — FENTANYL CITRATE (PF) 100 MCG/2ML IJ SOLN
25.0000 ug | INTRAMUSCULAR | Status: AC | PRN
  Administered 2015-10-05 (×6): 25 ug via INTRAVENOUS

## 2015-10-05 MED ORDER — PROPOFOL 10 MG/ML IV BOLUS
INTRAVENOUS | Status: DC | PRN
  Administered 2015-10-05: 150 mg via INTRAVENOUS

## 2015-10-05 MED ORDER — ONDANSETRON HCL 4 MG PO TABS: 4.0000 mg | ORAL_TABLET | Freq: Four times a day (QID) | ORAL | Status: DC | PRN

## 2015-10-05 MED ORDER — DOCUSATE SODIUM 100 MG PO CAPS
100.0000 mg | ORAL_CAPSULE | Freq: Two times a day (BID) | ORAL | Status: DC
  Administered 2015-10-05 – 2015-10-07 (×4): 100 mg via ORAL
  Filled 2015-10-05 (×4): qty 1

## 2015-10-05 MED ORDER — ONDANSETRON HCL 4 MG/2ML IJ SOLN
4.0000 mg | Freq: Four times a day (QID) | INTRAMUSCULAR | Status: DC | PRN
  Administered 2015-10-06: 4 mg via INTRAVENOUS
  Filled 2015-10-05: qty 2

## 2015-10-05 MED ORDER — NEOSTIGMINE METHYLSULFATE 10 MG/10ML IV SOLN
INTRAVENOUS | Status: DC | PRN
  Administered 2015-10-05: 4 mg via INTRAVENOUS

## 2015-10-05 MED ORDER — ROCURONIUM BROMIDE 100 MG/10ML IV SOLN
INTRAVENOUS | Status: DC | PRN
  Administered 2015-10-05: 10 mg via INTRAVENOUS
  Administered 2015-10-05: 40 mg via INTRAVENOUS
  Administered 2015-10-05: 10 mg via INTRAVENOUS

## 2015-10-05 MED ORDER — NALOXONE HCL 0.4 MG/ML IJ SOLN: 0.4000 mg | INTRAMUSCULAR | Status: DC | PRN

## 2015-10-05 MED ORDER — FENTANYL CITRATE (PF) 100 MCG/2ML IJ SOLN
INTRAMUSCULAR | Status: DC | PRN
  Administered 2015-10-05 (×3): 50 ug via INTRAVENOUS

## 2015-10-05 MED ORDER — CEFAZOLIN SODIUM-DEXTROSE 2-3 GM-% IV SOLR
2.0000 g | INTRAVENOUS | Status: AC
  Administered 2015-10-05: 2 g via INTRAVENOUS

## 2015-10-05 MED ORDER — LIDOCAINE 5 % EX PTCH
MEDICATED_PATCH | CUTANEOUS | Status: DC | PRN
  Administered 2015-10-05: 1 via TRANSDERMAL

## 2015-10-05 MED ORDER — FENTANYL CITRATE (PF) 100 MCG/2ML IJ SOLN
INTRAMUSCULAR | Status: AC
  Administered 2015-10-05: 25 ug via INTRAVENOUS
  Filled 2015-10-05: qty 2

## 2015-10-05 MED ORDER — PANTOPRAZOLE SODIUM 40 MG IV SOLR
40.0000 mg | Freq: Every day | INTRAVENOUS | Status: DC
  Administered 2015-10-05: 40 mg via INTRAVENOUS
  Filled 2015-10-05 (×3): qty 40

## 2015-10-05 MED ORDER — GLYCOPYRROLATE 0.2 MG/ML IJ SOLN
INTRAMUSCULAR | Status: DC | PRN
  Administered 2015-10-05: .8 mg via INTRAVENOUS

## 2015-10-05 MED ORDER — FAMOTIDINE 20 MG PO TABS
20.0000 mg | ORAL_TABLET | Freq: Once | ORAL | Status: AC
Start: 2015-10-05 — End: 2015-10-05
  Administered 2015-10-05: 20 mg via ORAL

## 2015-10-05 MED ORDER — DIPHENHYDRAMINE HCL 50 MG/ML IJ SOLN: 12.5000 mg | Freq: Four times a day (QID) | INTRAMUSCULAR | Status: DC | PRN

## 2015-10-05 MED ORDER — FAMOTIDINE 20 MG PO TABS
ORAL_TABLET | ORAL | Status: AC
  Filled 2015-10-05: qty 1

## 2015-10-05 MED ORDER — LIDOCAINE 5 % EX PTCH
MEDICATED_PATCH | CUTANEOUS | Status: AC
  Filled 2015-10-05: qty 1

## 2015-10-05 MED ORDER — LIDOCAINE HCL (CARDIAC) 20 MG/ML IV SOLN
INTRAVENOUS | Status: DC | PRN
  Administered 2015-10-05: 80 mg via INTRAVENOUS

## 2015-10-05 MED ORDER — FENTANYL CITRATE (PF) 100 MCG/2ML IJ SOLN
25.0000 ug | INTRAMUSCULAR | Status: DC | PRN
  Administered 2015-10-05 (×4): 25 ug via INTRAVENOUS

## 2015-10-05 MED ORDER — HYDROMORPHONE HCL 1 MG/ML IJ SOLN
INTRAMUSCULAR | Status: DC | PRN
  Administered 2015-10-05 (×2): .6 mg via INTRAVENOUS

## 2015-10-05 MED ORDER — LACTATED RINGERS IV SOLN
INTRAVENOUS | Status: DC
  Administered 2015-10-05 (×2): via INTRAVENOUS

## 2015-10-05 MED ORDER — ONDANSETRON HCL 4 MG/2ML IJ SOLN: 4.0000 mg | Freq: Once | INTRAMUSCULAR | Status: DC | PRN

## 2015-10-05 MED ORDER — KETOROLAC TROMETHAMINE 30 MG/ML IJ SOLN
30.0000 mg | Freq: Four times a day (QID) | INTRAMUSCULAR | Status: DC
  Filled 2015-10-05: qty 1

## 2015-10-05 MED ORDER — SIMETHICONE 80 MG PO CHEW: 80.0000 mg | CHEWABLE_TABLET | Freq: Four times a day (QID) | ORAL | Status: DC | PRN

## 2015-10-05 MED ORDER — KETOROLAC TROMETHAMINE 30 MG/ML IJ SOLN
30.0000 mg | Freq: Four times a day (QID) | INTRAMUSCULAR | Status: DC
  Administered 2015-10-05 – 2015-10-06 (×2): 30 mg via INTRAVENOUS
  Filled 2015-10-05: qty 9

## 2015-10-05 MED ORDER — OXYCODONE-ACETAMINOPHEN 5-325 MG PO TABS: 1.0000 | ORAL_TABLET | ORAL | Status: DC | PRN

## 2015-10-05 MED ORDER — ONDANSETRON HCL 4 MG/2ML IJ SOLN
INTRAMUSCULAR | Status: DC | PRN
  Administered 2015-10-05: 4 mg via INTRAVENOUS

## 2015-10-05 MED ORDER — MIDAZOLAM HCL 2 MG/2ML IJ SOLN
INTRAMUSCULAR | Status: DC | PRN
  Administered 2015-10-05: 2 mg via INTRAVENOUS

## 2015-10-05 MED ORDER — LACTATED RINGERS IV SOLN
INTRAVENOUS | Status: DC
  Administered 2015-10-05 – 2015-10-06 (×4): via INTRAVENOUS

## 2015-10-05 MED ORDER — KETOROLAC TROMETHAMINE 30 MG/ML IJ SOLN: 30.0000 mg | Freq: Four times a day (QID) | INTRAMUSCULAR | Status: DC

## 2015-10-05 MED ORDER — DIPHENHYDRAMINE HCL 12.5 MG/5ML PO ELIX: 12.5000 mg | ORAL_SOLUTION | Freq: Four times a day (QID) | ORAL | Status: DC | PRN

## 2015-10-05 SURGICAL SUPPLY — 37 items
BAG BILE T-TUBES STRL (MISCELLANEOUS) IMPLANT
BAG DRN 9.5 2 ADJ BELT ADPR (MISCELLANEOUS)
CANISTER SUCT 1200ML W/VALVE (MISCELLANEOUS) ×4 IMPLANT
CATH TRAY 16F METER LATEX (MISCELLANEOUS) ×4 IMPLANT
DRAPE LAPAROTOMY 100X77 ABD (DRAPES) IMPLANT
DRAPE LAPAROTOMY TRNSV 106X77 (MISCELLANEOUS) ×2 IMPLANT
DRSG OPSITE POSTOP 4X8 (GAUZE/BANDAGES/DRESSINGS) ×2 IMPLANT
DRSG TELFA 3X8 NADH (GAUZE/BANDAGES/DRESSINGS) ×4 IMPLANT
ELECT BLADE 6 FLAT ULTRCLN (ELECTRODE) ×4 IMPLANT
ELECT BLADE 6.5 EXT (BLADE) ×2 IMPLANT
ELECT CAUTERY BLADE 6.4 (BLADE) ×4 IMPLANT
GAUZE SPONGE 4X4 12PLY STRL (GAUZE/BANDAGES/DRESSINGS) ×4 IMPLANT
GLOVE BIO SURGEON STRL SZ 6 (GLOVE) ×6 IMPLANT
GLOVE INDICATOR 8.0 STRL GRN (GLOVE) ×6 IMPLANT
GOWN STRL REUS W/ TWL LRG LVL3 (GOWN DISPOSABLE) ×4 IMPLANT
GOWN STRL REUS W/ TWL XL LVL3 (GOWN DISPOSABLE) ×2 IMPLANT
GOWN STRL REUS W/TWL LRG LVL3 (GOWN DISPOSABLE) ×8
GOWN STRL REUS W/TWL XL LVL3 (GOWN DISPOSABLE) ×4
KIT RM TURNOVER STRD PROC AR (KITS) ×4 IMPLANT
LABEL OR SOLS (LABEL) ×4 IMPLANT
LIGASURE IMPACT 36 18CM CVD LR (INSTRUMENTS) ×2 IMPLANT
PACK BASIN MAJOR ARMC (MISCELLANEOUS) ×4 IMPLANT
PAD DRESSING TELFA 3X8 NADH (GAUZE/BANDAGES/DRESSINGS) ×2 IMPLANT
PAD GROUND ADULT SPLIT (MISCELLANEOUS) ×4 IMPLANT
STAPLER SKIN PROX 35W (STAPLE) ×4 IMPLANT
SUT CHROMIC 0 CT 1 (SUTURE) ×4 IMPLANT
SUT MAXON ABS #0 GS21 30IN (SUTURE) ×8 IMPLANT
SUT SILK 0 (SUTURE)
SUT SILK 0 30XBRD TIE 6 (SUTURE) ×4 IMPLANT
SUT VIC AB 0 CT1 27 (SUTURE) ×8
SUT VIC AB 0 CT1 27XCR 8 STRN (SUTURE) ×6 IMPLANT
SUT VIC AB 2-0 SH 27 (SUTURE) ×4
SUT VIC AB 2-0 SH 27XBRD (SUTURE) IMPLANT
SUT VIC AB 4-0 KS 27 (SUTURE) ×2 IMPLANT
TRAY PREP VAG/GEN (MISCELLANEOUS) ×4 IMPLANT
TUBING ART PRESS 48 MALE/FEM (TUBING) IMPLANT
WATER STERILE IRR 1000ML POUR (IV SOLUTION) ×4 IMPLANT

## 2015-10-05 NOTE — Anesthesia Procedure Notes (Signed)
Procedure Name: Intubation Date/Time: 10/05/2015 8:56 AM Performed by: [facility] Pre-anesthesia Checklist: Patient identified, Emergency Drugs available, Suction available, Patient being monitored and Timeout performed Patient Re-evaluated:Patient Re-evaluated prior to inductionOxygen Delivery Method: Circle system utilized Preoxygenation: Pre-oxygenation with 100% oxygen Intubation Type: IV induction Ventilation: Mask ventilation without difficulty Laryngoscope Size: Mac and 3 Grade View: Grade I Tube type: Oral Tube size: 7.0 mm Number of attempts: 1 Airway Equipment and Method: Stylet Placement Confirmation: ETT inserted through vocal cords under direct vision,  positive ETCO2,  CO2 detector and breath sounds checked- equal and bilateral Secured at: 21 cm Tube secured with: Tape Dental Injury: Teeth and Oropharynx as per pre-operative assessment

## 2015-10-05 NOTE — Interval H&P Note (Signed)
History and Physical Interval Note:  10/05/2015 8:23 AM  Monica Hardin  has presented today for surgery, with the diagnosis of uterine fibroids and right ovarian dermoid cyst  The various methods of treatment have been discussed with the patient and family. After consideration of risks, benefits and other options for treatment, the patient has consented to TAH/BSO   as a surgical intervention .  The patient's history has been reviewed, patient examined, no change in status, stable for surgery.  I have reviewed the patient's chart and labs.  Questions were answered to the patient's satisfaction.     Hassell Done A Rashawn Rolon

## 2015-10-05 NOTE — Op Note (Signed)
OPERATIVE NOTE:  Monica Hardin PROCEDURE DATE: 10/05/2015   PREOPERATIVE DIAGNOSIS:  1. Chronic pelvic pain 2. Rght ovarian dermoid cyst 3. Uterine fibroids POSTOPERATIVE DIAGNOSIS:  Same as above and pelvic adhesions PROCEDURE:  TAH/BSO  SURGEON:  [name], MD ASSISTANTS: Dr. Marcelline Mates ANESTHESIA: General INDICATIONS: 49 y.o. G3P20 with history of chronic pelvic pain. Recent laparoscopy demonstrated right ovarian dermoid as well as uterine fibroids and pelvic adhesions. Patient desires definitive surgery.  FINDINGS:   1. Fibroid uterus 2. 8 centimeters right ovarian dermoid cyst containing sebum and hair on gross examination   I/O's: Total I/O In: 1500 [I.V.:1500] Out: 675 [Urine:500; Blood:175] COUNTS:  YES SPECIMENS:  uterus with cervix, bilateral fallopian tubes and bilateral ovaries ANTIBIOTIC PROPHYLAXIS:Ancef 2 grams COMPLICATIONS: None immediate  PROCEDURE IN DETAIL:  The patient is brought into the operating room and was placed in supine position. General endotracheal anesthesia is induced without difficulty. Foley catheter is placed. A core prep abdominal perineal prep and drape was performed in standard fashion. Betadine cleansing of the vagina likewise was completed. After appropriate timeout procedure was then performed in standard fashion. Pfannenstiel incision was made in the abdomen. The fascia was incised transversely and extended bilaterally with Mayo scissors. The rectus fascia was dissected from the muscle through sharp and blunt. The midline raphae was identified and separated and peritoneum was entered. The abdomen and pelvis was explored manually with the above-noted findings being noted. Lysis of adhesions was performed between the bowel and the right dermoid cyst. This was performed using Bovie cautery. O'Connor-O'Sullivan retractor was then placed. The bowel was packed off with wet laps. The uterine cornua was grasped with Kelly clamps  bilaterally. The LigaSure instrument was then utilized to clamped, coagulate, and cut the round ligaments. Posterior leaf of the broad ligaments were opened. Ureters were identified bilaterally. The right infundibulopelvic ligament was crossclamped with curved Heaney clamps and cut. The infundibulopelvic ligament was doubly ligated using 0 Vicryl suture, with the first being a free tie and a second being a stick tie. The LigaSure instrument was then used on the left infundibular pelvic ligament as well as the cardinal broad ligament complexes. Clamping, coagulating, and cutting technique. A similar  Procedure was carried out on the right cardinal broad ligament complexes down to the cervicovaginal junction. At this point curved Heaney clamps were used to cross clamp the angles of the vagina. Specimen was then removed from the operative field. Angles of vagina were ligated using a 0 Vicryl suture in a Richardson technique. The intervening vaginal cuff mucosa was then reapproximated using figure-of-eight 0 Vicryl sutures. Good hemostasis was noted. Several bleeders in the adnexal pedicles were taken care of with Bovie cautery and one figure-of-eight 3-0 Vicryl suture. Once satisfied with hemostasis the pelvis was copiously irrigated and irrigant fluid was aspirated. The abdomen was then closed in layers using 0 Maxon suture on the fascia in a simple running manner. The skin was closed with subcuticular stitch of 4-0 Vicryl. A honeycomb dressing was placed over the incision Lidoderm patch was placed for analgesia. Patient was then awakened extubated and taken to recovery room in satisfactory condition  Karol Liendo A. Zipporah Plants, MD, ACOG [facility]

## 2015-10-05 NOTE — H&P (View-Only) (Signed)
Subjective:  PREOPERATIVE HISTORY AND PHYSICAL   Date of surgery: 10/05/2015 Chief complaint: 1.  Chronic pelvic pain. 2.  Right ovarian dermoid cyst   Patient is a 49 y.o. G3P2037female scheduled for Total abdominal hysterectomy, bilateral salpingo-oophorectomy. Indications for procedure are Chronic pelvic pain and right dermoid cyst. Summary: Patient is having monthly cycles, but not as heavy and lasts about three days. She has chronic fatigue, but is very concerned about abdominal bloating, and RLQ pain that she has noticed over the past three months. At times it radiates to LLQ and her lower back. She denies blood in stools and states occasionally gets constipated but not out of ordinary for her. She denies nausea or vomiting. She has been afebrile.  Patient underwent laparoscopy for evaluation of chronic pelvic pain  In August 2016.   Findings were notabe for pelvic adhesive disease in the upper abdomen, a 5 cm right dermoid cyst, and a bilobed left ovarian cyst of unclear etiology.  No stigmata of endometriosis was noted. Because of the patient's chronic pelvic pain syndrome and the pathology identified at the time of laparoscopy, she was counseled to undergo exploratory laparotomy with TAH/BSO.   Discussed Blood/Blood Products: yes   Menstrual History: OB History    Gravida Para Term Preterm AB TAB SAB Ectopic Multiple Living   3 2 2  1  1   2       Obstetric Comments   1st Menstrual Cycle:   ? 1st Pregnancy:  62      Menarche age: na Patient's last menstrual period was 10/01/2015.    Past Medical History  Diagnosis Date  . Thyroid disease   . Overweight   . Hyperlipidemia   . Chronic fatigue   . Hypothyroidism     Past Surgical History  Procedure Laterality Date  . Leep  [phone]    [facility]  . Mandible fracture surgery  1996  . Laparoscopy N/A 08/24/2015    Procedure: LAPAROSCOPY DIAGNOSTIC;  Surgeon: [name], MD;  Location: [facility] ORS;  Service:  Gynecology;  Laterality: N/A;    OB History  Gravida Para Term Preterm AB SAB TAB Ectopic Multiple Living  3 2 2  1 1    2     # Outcome Date GA Lbr Len/2nd Weight Sex Delivery Anes PTL Lv  3 Term 12/1996    M Vag-Spont   Y  2 Term 08/1995    F Vag-Spont   Y  1 SAB             Obstetric Comments  1st Menstrual Cycle:   ?  1st Pregnancy:  64    Social History   Social History  . Marital Status: Married    Spouse Name: N/A  . Number of Children: N/A  . 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   Social History Main Topics  . Smoking status: Never Smoker   . Smokeless tobacco: Never Used  . Alcohol Use: No  . Drug Use: No  . Sexual Activity:    Partners: Male   Other Topics Concern  . None   Social History Narrative    Family History  Problem Relation Age of Onset  . Lung cancer Mother   . Lung cancer Father   . Kidney Stones Daughter   . Asthma Son      (Not in a hospital admission)  No Known Allergies  Review of Systems Constitutional: No recent fever/chills/sweats Respiratory: No recent  cough/bronchitis Cardiovascular: No chest pain Gastrointestinal: No recent nausea/vomiting/diarrhea Genitourinary: No UTI symptoms Hematologic/lymphatic:No history of coagulopathy or recent blood thinner use    Objective:    BP 119/77 mmHg  Pulse 62  Ht 5\' 6"  (1.676 m)  Wt 174 lb 7 oz (79.124 kg)  BMI 28.17 kg/m2  LMP 10/01/2015  General:   Normal  Skin:   normal  HEENT:  Normal  Neck:  Supple without Adenopathy or Thyromegaly  Lungs:   Heart:              Breasts:   Abdomen:  Pelvis:  M/S   Extremeties:  Neuro:    clear to auscultation bilaterally   Normal without murmur   Not Examined   soft, non-tender; bowel sounds normal; no masses,  no organomegaly   Exam deferred to OR  No CVAT  Warm/Dry   Normal          Assessment:    1.  Chronic pelvic pain. 2.  Right dermoid cyst,, 5 cm 3.  Bilobed left ovarian cyst,  unclear etiology. 4.  Upper abdominal pevic adhesive disease  Plan:   1.  TAH/BSO through a Pfannenstiel incision.  Preoperative counseling: The patient is to undergo TAH/BSO through a Pfannenstiel incision for management of chronic pelvic pain and a recently identified right  ovarian dermoid cyst.  The patient is understanding of the planned procedure and is aware of and icepting of all surgical risks which include but are not limited to bleeding, infection, pelvic organ injury with need for repair, blood clot disorders, anesthesia risks, Etc.  All questions have been answered.  Informed consent is given.  Patient is ready and willing to proceed with surgery as scheduled.    [name], MD

## 2015-10-05 NOTE — Anesthesia Preprocedure Evaluation (Addendum)
Anesthesia Evaluation  Patient identified by MRN, date of birth, ID band Patient awake    Reviewed: Allergy & Precautions, NPO status , Patient's Chart, lab work & pertinent test results  Airway Mallampati: II  TM Distance: <3 FB Neck ROM: Full    Dental no notable dental hx. (+) Caps   Pulmonary neg pulmonary ROS,    Pulmonary exam normal breath sounds clear to auscultation       Cardiovascular negative cardio ROS Normal cardiovascular exam     Neuro/Psych negative neurological ROS     GI/Hepatic negative GI ROS, Neg liver ROS,   Endo/Other  Hypothyroidism   Renal/GU negative Renal ROS  negative genitourinary   Musculoskeletal negative musculoskeletal ROS (+)   Abdominal Normal abdominal exam  (+)   Peds negative pediatric ROS (+)  Hematology negative hematology ROS (+)   Anesthesia Other Findings Chronic fatigue syndrome  Reproductive/Obstetrics                            Anesthesia Physical Anesthesia Plan  ASA: II  Anesthesia Plan: General   Post-op Pain Management:    Induction: Intravenous  Airway Management Planned: Oral ETT  Additional Equipment:   Intra-op Plan:   Post-operative Plan: Extubation in OR  Informed Consent: I have reviewed the patients History and Physical, chart, labs and discussed the procedure including the risks, benefits and alternatives for the proposed anesthesia with the patient or authorized representative who has indicated his/her understanding and acceptance.   Dental advisory given  Plan Discussed with: CRNA and Surgeon  Anesthesia Plan Comments:         Anesthesia Quick Evaluation

## 2015-10-05 NOTE — Transfer of Care (Signed)
Immediate Anesthesia Transfer of Care Note  Patient: Monica Hardin  Procedure(s) Performed: Procedure(s): HYSTERECTOMY ABDOMINAL (N/A) SALPINGO OOPHORECTOMY (Bilateral)  Patient Location: PACU  Anesthesia Type:General  Level of Consciousness: awake, alert  and oriented  Airway & Oxygen Therapy: Patient Spontanous Breathing and Patient connected to nasal cannula oxygen  Post-op Assessment: Report given to RN and Post -op Vital signs reviewed and stable  Post vital signs: Reviewed and stable  Last Vitals:  Filed Vitals:   10/05/15 0832  BP:   Pulse:   Temp: 36.2 C  Resp:     Complications: No apparent anesthesia complications

## 2015-10-06 LAB — POCT PREGNANCY, URINE: Preg Test, Ur: NEGATIVE

## 2015-10-06 LAB — HEMOGLOBIN: HEMOGLOBIN: 11.9 g/dL — AB (ref 12.0–16.0)

## 2015-10-06 MED ORDER — HYDROMORPHONE HCL 2 MG PO TABS
2.0000 mg | ORAL_TABLET | ORAL | Status: DC | PRN
  Administered 2015-10-06 – 2015-10-07 (×5): 2 mg via ORAL
  Filled 2015-10-06 (×5): qty 1

## 2015-10-06 NOTE — Progress Notes (Signed)
1 Day Post-Op Procedure(s) (LRB): HYSTERECTOMY ABDOMINAL (N/A) SALPINGO OOPHORECTOMY (Bilateral)  Subjective: Patient reports nausea and + flatus.    Objective: I have reviewed patient's vital signs, intake and output, medications and labs.  General: alert and cooperative Resp: unlabored breathing Cardio: regular rate and rhythm GI: soft, non-tender; bowel sounds normal; no masses,  no organomegaly and incision: dry, intact and bloody drainage present Extremities: extremities normal, atraumatic, no cyanosis or edema and Homans sign is negative, no sign of DVT  Assessment: s/p Procedure(s): HYSTERECTOMY ABDOMINAL (N/A) SALPINGO OOPHORECTOMY (Bilateral): stable  Plan: Advance diet Encourage ambulation Advance to PO medication D/C foley  LOS: 1 day    Alanda Slim Defrancesco 10/06/2015, 9:41 AM

## 2015-10-06 NOTE — Anesthesia Postprocedure Evaluation (Signed)
  Anesthesia Post-op Note  Patient: Monica Hardin  Procedure(s) Performed: Procedure(s): HYSTERECTOMY ABDOMINAL (N/A) SALPINGO OOPHORECTOMY (Bilateral)  Anesthesia type:General  Patient location: PACU  Post pain: Pain level controlled  Post assessment: Post-op Vital signs reviewed, Patient's Cardiovascular Status Stable, Respiratory Function Stable, Patent Airway and No signs of Nausea or vomiting  Post vital signs: Reviewed and stable  Last Vitals:  Filed Vitals:   10/06/15 1608  BP: 106/52  Pulse: 77  Temp: 36.8 C  Resp: 18    Level of consciousness: awake, alert  and patient cooperative  Complications: No apparent anesthesia complications

## 2015-10-07 DIAGNOSIS — D259 Leiomyoma of uterus, unspecified: Secondary | ICD-10-CM

## 2015-10-07 LAB — SURGICAL PATHOLOGY

## 2015-10-07 MED ORDER — HYDROMORPHONE HCL 2 MG PO TABS: 2.0000 mg | ORAL_TABLET | ORAL | Status: DC | PRN

## 2015-10-07 MED ORDER — PANTOPRAZOLE SODIUM 40 MG PO TBEC: 40.0000 mg | DELAYED_RELEASE_TABLET | Freq: Every day | ORAL | Status: DC

## 2015-10-07 MED ORDER — ACETAMINOPHEN 500 MG PO TABS
1000.0000 mg | ORAL_TABLET | Freq: Four times a day (QID) | ORAL | Status: DC | PRN
  Administered 2015-10-07: 1000 mg via ORAL
  Filled 2015-10-07: qty 2

## 2015-10-07 MED ORDER — DOCUSATE SODIUM 100 MG PO CAPS: 100.0000 mg | ORAL_CAPSULE | Freq: Two times a day (BID) | ORAL | Status: DC

## 2015-10-07 MED ORDER — IBUPROFEN 800 MG PO TABS: 800.0000 mg | ORAL_TABLET | Freq: Three times a day (TID) | ORAL | Status: DC

## 2015-10-07 MED ORDER — IBUPROFEN 800 MG PO TABS
800.0000 mg | ORAL_TABLET | Freq: Three times a day (TID) | ORAL | Status: DC
  Administered 2015-10-07: 800 mg via ORAL
  Filled 2015-10-07: qty 1

## 2015-10-07 NOTE — Discharge Instructions (Signed)
Abdominal Hysterectomy, Care After Refer to this sheet in the next few weeks. These instructions provide you with information on caring for yourself after your procedure. Your health care provider may also give you more specific instructions. Your treatment has been planned according to current medical practices, but problems sometimes occur. Call your health care provider if you have any problems or questions after your procedure.  WHAT TO EXPECT AFTER THE PROCEDURE After your procedure, it is typical to have the following:  Pain.  Feeling tired.  Poor appetite.  Less interest in sex. It takes 4-6 weeks to recover from this surgery.  HOME CARE INSTRUCTIONS   Take pain medicines only as directed by your health care provider. Do not take over-the-counter pain medicines without checking with your health care provider first.  Change your bandage as directed by your health care provider.  Return to your health care provider in one week to have your incision checked.   Take showers instead of baths for 2-3 weeks. Ask your health care provider when it is safe to start showering.  Do not douche, use tampons, or have sexual intercourse for at least 6 weeks or until your health care provider says you can.   Follow your health care provider's advice about exercise, lifting, driving, and general activities.  Get plenty of rest and sleep.   Do not lift anything heavier than a gallon of milk (about 10 lb [4.5 kg]) for the first month after surgery.  You can resume your normal diet if your health care provider says it is okay.   Do not drink alcohol until your health care provider says you can.   If you are constipated, ask your health care provider if you can take a mild laxative.  Eating foods high in fiber may also help with constipation. Eat plenty of raw fruits and vegetables, whole grains, and beans.  Drink enough fluids to keep your urine clear or pale yellow.   Try to have  someone at home with you for the first 1-2 weeks to help around the house.  Keep all follow-up appointments. SEEK MEDICAL CARE IF:   You have chills or fever.  You have swelling, redness, or pain in the area of your incision that is getting worse.   You have pus coming from the incision.   You notice a bad smell coming from the incision or bandage.   Your incision breaks open.   You feel dizzy or light-headed.   You have pain or bleeding when you urinate.   You have persistent diarrhea.   You have persistent nausea and vomiting.   You have abnormal vaginal discharge.   You have a rash.   You have any type of abnormal reaction or develop an allergy to your medicine.   Your pain medicine is not helping.  SEEK IMMEDIATE MEDICAL CARE IF:   You have a fever and your symptoms suddenly get worse.  You have severe abdominal pain.  You have chest pain.  You have shortness of breath.  You faint.  You have pain, swelling, or redness of your leg.  You have heavy vaginal bleeding with blood clots. MAKE SURE YOU:  Understand these instructions.  Will watch your condition.  Will get help right away if you are not doing well or get worse.   This information is not intended to replace advice given to you by your health care provider. Make sure you discuss any questions you have with your health care provider.  Document Released: 07/01/2005 Document Revised: 01/02/2015 Document Reviewed: 10/04/2013 Elsevier Interactive Patient Education [employer].

## 2015-10-07 NOTE — Progress Notes (Signed)
Patient discharged home. Vital signs stable, inc WDL, pain well controlled on oral medications. Discharge instructions, prescriptions, and follow up appointment given to and reviewed with patient. Patient verbalized understanding, all questions answered. Escorted in wheelchair by auxiliary.

## 2015-10-07 NOTE — Discharge Summary (Signed)
Physician Discharge Summary  Patient ID: Monica Hardin MRN: [id number] DOB/AGE: January 04, 1966 49 y.o.  Admit date: 10/05/2015 Discharge date: 10/07/2015  Admission Diagnoses: Dermoiod Cyst, Chronic Pelvic Pain and Fibroids  Discharge Diagnoses:  SAA  Operative Procedures: Procedure(s): HYSTERECTOMY ABDOMINAL (N/A) SALPINGO OOPHORECTOMY (Bilateral)  Hospital Course: Uncomplicated.   Significant Diagnostic Studies:  Lab Results  Component Value Date   HGB 11.9* 10/06/2015   HGB 13.4 10/01/2015   HGB 13.1 08/19/2015   Lab Results  Component Value Date   HCT 40.5 10/01/2015   HCT 39.3 08/19/2015   HCT 41.7 07/10/2015   CBC Latest Ref Rng 10/06/2015 10/01/2015 08/19/2015  WBC 3.6 - 11.0 K/uL - 7.3 8.0  Hemoglobin 12.0 - 16.0 g/dL 11.9(L) 13.4 13.1  Hematocrit 35.0 - 47.0 % - 40.5 39.3  Platelets 150 - 440 K/uL - 334 329     Discharged Condition: good  Discharge Exam: Blood pressure 124/60, pulse 73, temperature 98.1 F (36.7 C), temperature source Oral, resp. rate 18, height 5\' 6"  (1.676 m), weight 174 lb (78.926 kg), last menstrual period 09/29/2015, SpO2 97 %. Incision/Wound: clean, dry and no drainage  Disposition: 01-Home or Self Care     Medication List    ASK your doctor about these medications        ibuprofen 800 MG tablet  Commonly known as:  ADVIL,MOTRIN  Take 1 tablet (800 mg total) by mouth 3 (three) times daily.     levothyroxine 100 MCG tablet  Commonly known as:  SYNTHROID, LEVOTHROID  Take 1 tablet (100 mcg total) by mouth daily before breakfast.     nystatin-triamcinolone ointment  Commonly known as:  MYCOLOG  Apply 1 application topically 2 (two) times daily.           Follow-up Information    Follow up with [name], MD In 1 week.   Specialties:  Obstetrics and Gynecology, Radiology   Why:  Post Op Check   Contact information:   [address] [place] [address] [phone]       Signed: Alanda Slim Dayn Barich 10/07/2015, 1:29 PM

## 2015-10-14 ENCOUNTER — Encounter: Payer: Self-pay | Admitting: Obstetrics and Gynecology

## 2015-10-14 ENCOUNTER — Ambulatory Visit (INDEPENDENT_AMBULATORY_CARE_PROVIDER_SITE_OTHER): Payer: Managed Care, Other (non HMO) | Admitting: Obstetrics and Gynecology

## 2015-10-14 VITALS — BP 134/79 | HR 80 | Ht 66.0 in | Wt 168.8 lb

## 2015-10-14 DIAGNOSIS — Z9079 Acquired absence of other genital organ(s): Secondary | ICD-10-CM

## 2015-10-14 DIAGNOSIS — Z90710 Acquired absence of both cervix and uterus: Secondary | ICD-10-CM

## 2015-10-14 DIAGNOSIS — D259 Leiomyoma of uterus, unspecified: Secondary | ICD-10-CM

## 2015-10-14 DIAGNOSIS — D369 Benign neoplasm, unspecified site: Secondary | ICD-10-CM

## 2015-10-14 DIAGNOSIS — Z90722 Acquired absence of ovaries, bilateral: Secondary | ICD-10-CM

## 2015-10-14 DIAGNOSIS — Z09 Encounter for follow-up examination after completed treatment for conditions other than malignant neoplasm: Secondary | ICD-10-CM

## 2015-10-14 NOTE — Progress Notes (Signed)
Patient ID: BENEDETTA SUNDSTROM, female   DOB: 05/13/1966, 49 y.o.   MRN: [id number]  Chief complaint: 1.  One week postop check.    1 week post op Tah/bso Bladder/bowels- good Pos eating/drinking Pain meds at nite only ibup during the day Incision- slight drainage- no redenss nofevers   Past medical history, past surgical history, medications, allergies, reviewed.  OBJECTIVE: BP 134/79 mmHg  Pulse 80  Ht 5\' 6"  (1.676 m)  Wt 168 lb 12.8 oz (76.567 kg)  BMI 27.26 kg/m2  LMP 10/01/2015 (Exact Date) Well-appearing white female in no acute distress. Abdomen-soft, nontender; Pfannenstiel incision is well approximated with minimal crusting; Steri-Strips are removed.  No hernia.  Pathology: Bilateral dermoid cysts and fibroids.  IMPRESSION: 1.  One week postop check status post TAH/BSO for dermoid cysts and fibroids. 2.  No current vasomotor symptoms following BSO.  PLAN: 1.  Local wound care is reviewed. 2.  Postoperative precautions are continued. 3.  Return in 5 weeks for final postop check.  [name], MD  Note: This dictation was prepared with Dragon dictation along with smaller phrase technology. Any transcriptional errors that result from this process are unintentional.

## 2015-10-28 ENCOUNTER — Telehealth: Payer: Self-pay | Admitting: Obstetrics and Gynecology

## 2015-10-28 MED ORDER — IBUPROFEN 800 MG PO TABS: 800.0000 mg | ORAL_TABLET | Freq: Three times a day (TID) | ORAL | Status: DC

## 2015-10-28 NOTE — Telephone Encounter (Signed)
Pt had hysterectomy 3 weeks ago. She still has some clear stuff coming out of her incision. Not red but really sore. And she wanted to get her ibuprophen refilled

## 2015-10-28 NOTE — Telephone Encounter (Signed)
Clear drainage from incision- more this week - pt is more active. Still sore. NO redness. No yellow or brown d/c or odor. No fevers. Want ibup refill. Pt aware will erx. If fever, pain, or drainage pt will need to be seen.

## 2015-11-12 ENCOUNTER — Encounter: Payer: Self-pay | Admitting: Obstetrics and Gynecology

## 2015-11-12 ENCOUNTER — Ambulatory Visit (INDEPENDENT_AMBULATORY_CARE_PROVIDER_SITE_OTHER): Payer: Managed Care, Other (non HMO) | Admitting: Obstetrics and Gynecology

## 2015-11-12 VITALS — BP 116/79 | HR 87 | Ht 66.0 in | Wt 169.5 lb

## 2015-11-12 DIAGNOSIS — Z9079 Acquired absence of other genital organ(s): Secondary | ICD-10-CM

## 2015-11-12 DIAGNOSIS — Z8742 Personal history of other diseases of the female genital tract: Secondary | ICD-10-CM | POA: Insufficient documentation

## 2015-11-12 DIAGNOSIS — Z90722 Acquired absence of ovaries, bilateral: Secondary | ICD-10-CM

## 2015-11-12 DIAGNOSIS — D270 Benign neoplasm of right ovary: Secondary | ICD-10-CM

## 2015-11-12 DIAGNOSIS — Z90710 Acquired absence of both cervix and uterus: Secondary | ICD-10-CM

## 2015-11-12 DIAGNOSIS — D271 Benign neoplasm of left ovary: Secondary | ICD-10-CM

## 2015-11-12 NOTE — Progress Notes (Signed)
Patient ID: Monica Hardin, female   DOB: 09/12/1966, 49 y.o.   MRN: [id number] 5 week post op- tah bso  Chief complaint: 1. Status post TAH/BSO 2. Final postop check  Patient is now 5 weeks status post TAH/BSO for bilateral dermoid cysts and pelvic pain. She is doing well with normal bowel and bladder function. She is experiencing minimal discomfort. Incision is well approximated except for a 5 mm defect with residual suture coming through the epithelial mucosa in the midline. She is not experiencing any vaginal spotting or discharge. She is not having any vasomotor symptoms. Currently she is not on any ERT therapy.  OBJECTIVE: BP 116/79 mmHg  Pulse 87  Ht 5\' 6"  (1.676 m)  Wt 169 lb 8 oz (76.885 kg)  BMI 27.37 kg/m2  LMP 10/01/2015 (Exact Date) Pleasant well-appearing white female in no acute distress. Abdomen: Soft, nontender, without organomegaly. No hernias. The Pfannenstiel incision is well approximated except for a 5 mm defect in the central aspect of the incision with residual suture present. Pelvic: External genitalia-normal BUS-normal Vagina-good estrogen effect; vaginal cuff with minimal granulation tissue present Bimanual-mild cuff induration consistent with post surgical stage; minimal tenderness. Good vault support. RV-normal external exam  IMPRESSION: 1. 5 weeks status post TAH/BSO for bilateral dermoid cysts and chronic pelvic pain 2. Pelvic pain is resolved. 3. No vasomotor symptoms; not on ERT. 4. Minimal vaginal cuff induration/granulation persists  PLAN: 1. Resume activities as tolerated 2. Monitor for vasomotor symptoms 3. Return in 2 months for follow-up on pelvic exam and surgical menopause.  [name], MD  Note: This dictation was prepared with Dragon dictation along with smaller phrase technology. Any transcriptional errors that result from this process are unintentional.

## 2015-11-12 NOTE — Patient Instructions (Signed)
1. Resume all activities without restriction 2. Watch for development of vasomotor symptoms 3. Vaginal cuff is still healing. May resume relations as tolerated 4. Return in 2 months for follow-up

## 2016-01-12 ENCOUNTER — Ambulatory Visit: Payer: Managed Care, Other (non HMO) | Admitting: Obstetrics and Gynecology

## 2016-03-24 ENCOUNTER — Other Ambulatory Visit: Payer: Self-pay | Admitting: Family Medicine

## 2016-03-24 ENCOUNTER — Encounter: Payer: Self-pay | Admitting: Family Medicine

## 2016-03-24 MED ORDER — LEVOTHYROXINE SODIUM 100 MCG PO TABS: 100.0000 ug | ORAL_TABLET | Freq: Every day | ORAL | Status: DC

## 2016-04-19 ENCOUNTER — Encounter: Payer: Self-pay | Admitting: Family Medicine

## 2016-04-19 ENCOUNTER — Ambulatory Visit (INDEPENDENT_AMBULATORY_CARE_PROVIDER_SITE_OTHER): Payer: Managed Care, Other (non HMO) | Admitting: Family Medicine

## 2016-04-19 VITALS — BP 116/74 | HR 89 | Temp 98.1°F | Resp 16 | Ht 66.0 in | Wt 170.0 lb

## 2016-04-19 DIAGNOSIS — M5431 Sciatica, right side: Secondary | ICD-10-CM | POA: Diagnosis not present

## 2016-04-19 DIAGNOSIS — Z90710 Acquired absence of both cervix and uterus: Secondary | ICD-10-CM

## 2016-04-19 DIAGNOSIS — Z90722 Acquired absence of ovaries, bilateral: Secondary | ICD-10-CM

## 2016-04-19 DIAGNOSIS — N958 Other specified menopausal and perimenopausal disorders: Secondary | ICD-10-CM | POA: Diagnosis not present

## 2016-04-19 DIAGNOSIS — Z8742 Personal history of other diseases of the female genital tract: Secondary | ICD-10-CM | POA: Diagnosis not present

## 2016-04-19 DIAGNOSIS — D8989 Other specified disorders involving the immune mechanism, not elsewhere classified: Secondary | ICD-10-CM

## 2016-04-19 DIAGNOSIS — Z86018 Personal history of other benign neoplasm: Secondary | ICD-10-CM | POA: Insufficient documentation

## 2016-04-19 DIAGNOSIS — R5382 Chronic fatigue, unspecified: Secondary | ICD-10-CM | POA: Diagnosis not present

## 2016-04-19 DIAGNOSIS — E038 Other specified hypothyroidism: Secondary | ICD-10-CM

## 2016-04-19 DIAGNOSIS — E8940 Asymptomatic postprocedural ovarian failure: Secondary | ICD-10-CM

## 2016-04-19 DIAGNOSIS — G9332 Myalgic encephalomyelitis/chronic fatigue syndrome: Secondary | ICD-10-CM

## 2016-04-19 DIAGNOSIS — Z9079 Acquired absence of other genital organ(s): Secondary | ICD-10-CM | POA: Diagnosis not present

## 2016-04-19 MED ORDER — CYCLOBENZAPRINE HCL 10 MG PO TABS: 5.0000 mg | ORAL_TABLET | Freq: Every day | ORAL | Status: DC

## 2016-04-19 NOTE — Progress Notes (Signed)
Name: Monica Hardin   MRN: [id number]    DOB: 04-20-1966   Date:04/19/2016       Progress Note  Subjective  Chief Complaint  Chief Complaint  Patient presents with  . Medication Refill    6 month F/U  . Hypothyroidism    Patient states she has been extremely fatigue, since having her hysterectomy. Would like blood work checked.   . Chronic Fatigue    Stable  . Back Pain    Onset-Since January, intermittently pain in her lower right back region and radiating down her right leg. States it is getting better.     HPI  Hypothyroidism: she has been feeling more tired over the past few months, had a hysterectomy back in October with bilateral oophorectomy, but is concerned about worsening of fatigue. No constipation, no weight gain  Chronic Fatigue: continues to feel really tired at the end of the day, but since hysterectomy feeling worse, tired all day and night. Not on medication  Menopausal symptoms: had hysterectomy and initially did not want to start estrogen therapy. She has noticed bloating, fatigue, night sweats, occasional hot flashes, mood changes, no energy, more fatigued than usual. She will discuss medication with Dr. Enzo Bi  Back Pain: symptoms started in January, no injury, she developed right low back pain that occasionally radiates to right outer thigh, no bowel or bladder incontinence, or saddle anesthesia. Thinking about seeing chiropractor.    Patient Active Problem List   Diagnosis Date Noted  . History of uterine leiomyoma 04/19/2016  . Sciatica, right side 04/19/2016  . Status post TAH-BSO 11/12/2015  . History of ovarian cyst 11/12/2015  . Dyslipidemia 07/04/2015  . Adult hypothyroidism 07/04/2015  . Excess weight 07/04/2015  . Hot flashes 07/04/2015  . Disturbance of skin sensation 07/04/2015  . Lipoma of arm 12/03/2014  . CFIDS (chronic fatigue and immune dysfunction syndrome) 05/08/2007    Past Surgical History  Procedure Laterality Date  .  [place]  [phone]    [facility]  . Mandible fracture surgery  1996  . Laparoscopy N/A 08/24/2015    Procedure: LAPAROSCOPY DIAGNOSTIC;  Surgeon: [name], MD;  Location: [facility];  Service: Gynecology;  Laterality: N/A;  . Fracture surgery    . Abdominal hysterectomy N/A 10/05/2015    Procedure: HYSTERECTOMY ABDOMINAL;  Surgeon: [name], MD;  Location: [facility] OR[facility];  Service: Gynecology;  Laterality: N/A;  . Salpingoophorectomy Bilateral 10/05/2015    Procedure: SALPINGO OOPHORECTOMY;  Surgeon: [name], MD;  Location: [facility];  Service: Gynecology;  Laterality: Bilateral;    Family History  Problem Relation Age of Onset  . Lung cancer Mother   . Lung cancer Father   . Kidney Stones Daughter   . Asthma Son     Social History   Social History  . Marital Status: Married    Spouse Name: N/A  . Number of Children: N/A  . 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   Social History Main Topics  . Smoking status: Never Smoker   . Smokeless tobacco: Never Used  . Alcohol Use: No  . Drug Use: No  . Sexual Activity:    Partners: Male    Birth Control/ Protection: None     Comment: Hysterectomy   Other Topics Concern  . Not on file   Social History Narrative     Current outpatient prescriptions:  .  levothyroxine (SYNTHROID, LEVOTHROID) 100 MCG tablet, Take 1 tablet (100  mcg total) by mouth daily before breakfast., Disp: 90 tablet, Rfl: 0  No Known Allergies   ROS  Constitutional: Negative for fever or weight change.  Respiratory: Negative for cough and shortness of breath.   Cardiovascular: Negative for chest pain or palpitations.  Gastrointestinal: Negative for abdominal pain, no bowel changes.  Musculoskeletal: Negative for gait problem or joint swelling.  Skin: Negative for rash.  Neurological: Negative for dizziness or headache.  No other specific complaints in a complete review of systems (except as listed in HPI  above).  Objective  Filed Vitals:   04/19/16 1017  BP: 116/74  Pulse: 89  Temp: 98.1 F (36.7 C)  TempSrc: Oral  Resp: 16  Height: 5\' 6"  (1.676 m)  Weight: 170 lb (77.111 kg)  SpO2: 95%    Body mass index is 27.45 kg/(m^2).  Physical Exam  Constitutional: Patient appears well-developed and well-nourished. Obese  No distress.  HEENT: head atraumatic, normocephalic, pupils equal and reactive to light, , neck supple, throat within normal limits Cardiovascular: Normal rate, regular rhythm and normal heart sounds.  No murmur heard. No BLE edema. Pulmonary/Chest: Effort normal and breath sounds normal. No respiratory distress. Abdominal: Soft.  There is no tenderness. Psychiatric: Patient has a normal mood and affect. behavior is normal. Judgment and thought content normal. Muscular Skeletal: pain during palpation of right lumbar spine, negative straight leg raise, normal rom of lumbar spine Neurological : normal pattellar reflexes   PHQ2/9: Depression screen [facility] 2/9 04/19/2016 07/07/2015  Decreased Interest 0 0  Down, Depressed, Hopeless 0 0  PHQ - 2 Score 0 0     Fall Risk: Fall Risk  04/19/2016 07/07/2015  Falls in the past year? No No     Functional Status Survey: Is the patient deaf or have difficulty hearing?: No Does the patient have difficulty seeing, even when wearing glasses/contacts?: No Does the patient have difficulty concentrating, remembering, or making decisions?: No Does the patient have difficulty walking or climbing stairs?: No Does the patient have difficulty dressing or bathing?: No Does the patient have difficulty doing errands alone such as visiting a doctor's office or shopping?: No    Assessment & Plan  1. Other specified hypothyroidism  - TSH  2. History of uterine leiomyoma  removed  3. Sciatica, right side  She is willing to try  Muscle relaxer  4. CFIDS (chronic fatigue and immune dysfunction syndrome)  - Vitamin B12 - VITAMIN  D 25 Hydroxy (Vit-D Deficiency, Fractures) - Hemoglobin A1c - Comprehensive metabolic panel - CBC with Differential/Platelet  5. Status post TAH-BSO  Fatigue likely from menopausal symptoms  6. Postsurgical menopause

## 2016-04-19 NOTE — Patient Instructions (Signed)

## 2016-04-20 ENCOUNTER — Other Ambulatory Visit: Payer: Self-pay | Admitting: Family Medicine

## 2016-04-20 DIAGNOSIS — E559 Vitamin D deficiency, unspecified: Secondary | ICD-10-CM | POA: Insufficient documentation

## 2016-04-20 DIAGNOSIS — R7989 Other specified abnormal findings of blood chemistry: Secondary | ICD-10-CM

## 2016-04-20 DIAGNOSIS — E538 Deficiency of other specified B group vitamins: Secondary | ICD-10-CM | POA: Insufficient documentation

## 2016-04-20 HISTORY — DX: Other specified abnormal findings of blood chemistry: R79.89

## 2016-04-20 HISTORY — DX: Vitamin D deficiency, unspecified: E55.9

## 2016-04-20 LAB — CBC WITH DIFFERENTIAL/PLATELET
BASOS ABS: 0 10*3/uL (ref 0.0–0.2)
Basos: 0 %
EOS (ABSOLUTE): 0.2 10*3/uL (ref 0.0–0.4)
Eos: 2 %
HEMATOCRIT: 43.7 % (ref 34.0–46.6)
Hemoglobin: 14.5 g/dL (ref 11.1–15.9)
Immature Grans (Abs): 0 10*3/uL (ref 0.0–0.1)
Immature Granulocytes: 0 %
LYMPHS ABS: 2.8 10*3/uL (ref 0.7–3.1)
Lymphs: 43 %
MCH: 30.3 pg (ref 26.6–33.0)
MCHC: 33.2 g/dL (ref 31.5–35.7)
MCV: 91 fL (ref 79–97)
MONOS ABS: 0.3 10*3/uL (ref 0.1–0.9)
Monocytes: 5 %
Neutrophils Absolute: 3.3 10*3/uL (ref 1.4–7.0)
Neutrophils: 50 %
Platelets: 404 10*3/uL — ABNORMAL HIGH (ref 150–379)
RBC: 4.78 x10E6/uL (ref 3.77–5.28)
RDW: 13.8 % (ref 12.3–15.4)
WBC: 6.5 10*3/uL (ref 3.4–10.8)

## 2016-04-20 LAB — COMPREHENSIVE METABOLIC PANEL
ALBUMIN: 5.2 g/dL (ref 3.5–5.5)
ALK PHOS: 96 IU/L (ref 39–117)
ALT: 29 IU/L (ref 0–32)
AST: 21 IU/L (ref 0–40)
Albumin/Globulin Ratio: 1.7 (ref 1.2–2.2)
BILIRUBIN TOTAL: 0.3 mg/dL (ref 0.0–1.2)
BUN / CREAT RATIO: 15 (ref 9–23)
BUN: 13 mg/dL (ref 6–24)
CHLORIDE: 99 mmol/L (ref 96–106)
CO2: 22 mmol/L (ref 18–29)
Calcium: 9.9 mg/dL (ref 8.7–10.2)
Creatinine, Ser: 0.85 mg/dL (ref 0.57–1.00)
GFR calc Af Amer: 92 mL/min/{1.73_m2} (ref >59)
GFR calc non Af Amer: 80 mL/min/{1.73_m2} (ref >59)
GLOBULIN, TOTAL: 3 g/dL (ref 1.5–4.5)
Glucose: 85 mg/dL (ref 65–99)
Potassium: 4.3 mmol/L (ref 3.5–5.2)
SODIUM: 142 mmol/L (ref 134–144)
Total Protein: 8.2 g/dL (ref 6.0–8.5)

## 2016-04-20 LAB — TSH: TSH: 4.54 u[IU]/mL — ABNORMAL HIGH (ref 0.450–4.500)

## 2016-04-20 LAB — HEMOGLOBIN A1C
Est. average glucose Bld gHb Est-mCnc: 117 mg/dL
Hgb A1c MFr Bld: 5.7 % — ABNORMAL HIGH (ref 4.8–5.6)

## 2016-04-20 LAB — VITAMIN D 25 HYDROXY (VIT D DEFICIENCY, FRACTURES): VIT D 25 HYDROXY: 18.6 ng/mL — AB (ref 30.0–100.0)

## 2016-04-20 LAB — VITAMIN B12: VITAMIN B 12: 393 pg/mL (ref 211–946)

## 2016-04-20 MED ORDER — VITAMIN D (ERGOCALCIFEROL) 1.25 MG (50000 UNIT) PO CAPS: 50000.0000 [IU] | ORAL_CAPSULE | ORAL | Status: DC

## 2016-04-20 MED ORDER — VITAMIN B-12 1000 MCG SL SUBL: 1.0000 | SUBLINGUAL_TABLET | SUBLINGUAL | Status: DC

## 2016-04-21 ENCOUNTER — Encounter: Payer: Self-pay | Admitting: Family Medicine

## 2016-04-21 MED ORDER — LEVOTHYROXINE SODIUM 100 MCG PO TABS: 100.0000 ug | ORAL_TABLET | Freq: Every day | ORAL | Status: DC

## 2016-05-05 ENCOUNTER — Encounter: Payer: Self-pay | Admitting: Obstetrics and Gynecology

## 2016-05-05 ENCOUNTER — Ambulatory Visit: Payer: Managed Care, Other (non HMO) | Admitting: Obstetrics and Gynecology

## 2016-05-05 ENCOUNTER — Telehealth: Payer: Self-pay

## 2016-05-05 MED ORDER — ESTRADIOL 1 MG PO TABS
1.0000 mg | ORAL_TABLET | Freq: Every day | ORAL | Status: DC
Start: 2016-05-05 — End: 2016-06-06

## 2016-05-05 NOTE — Telephone Encounter (Signed)
Pt aware per my chart message. Ok per mad to erx estradiol 1mg  and f/u in 6 weeks.

## 2016-05-21 ENCOUNTER — Encounter: Payer: Self-pay | Admitting: Obstetrics and Gynecology

## 2016-06-06 ENCOUNTER — Other Ambulatory Visit: Payer: Self-pay

## 2016-06-06 ENCOUNTER — Other Ambulatory Visit: Payer: Self-pay | Admitting: Family Medicine

## 2016-06-06 MED ORDER — ESTRADIOL 1 MG PO TABS: 1.0000 mg | ORAL_TABLET | Freq: Every day | ORAL | Status: DC

## 2016-06-14 ENCOUNTER — Ambulatory Visit: Payer: Managed Care, Other (non HMO) | Admitting: Obstetrics and Gynecology

## 2016-06-16 ENCOUNTER — Ambulatory Visit: Payer: Managed Care, Other (non HMO) | Admitting: Obstetrics and Gynecology

## 2016-06-16 ENCOUNTER — Telehealth: Payer: Self-pay | Admitting: Family Medicine

## 2016-06-16 DIAGNOSIS — E038 Other specified hypothyroidism: Secondary | ICD-10-CM

## 2016-06-16 DIAGNOSIS — D473 Essential (hemorrhagic) thrombocythemia: Secondary | ICD-10-CM

## 2016-06-16 DIAGNOSIS — D75839 Thrombocytosis, unspecified: Secondary | ICD-10-CM | POA: Insufficient documentation

## 2016-06-16 DIAGNOSIS — E559 Vitamin D deficiency, unspecified: Secondary | ICD-10-CM

## 2016-06-16 DIAGNOSIS — E538 Deficiency of other specified B group vitamins: Secondary | ICD-10-CM

## 2016-06-16 NOTE — Telephone Encounter (Signed)
Please order labs.

## 2016-06-16 NOTE — Telephone Encounter (Signed)
Pt said that the dr had told her that she is to have labs rechecked again. Said that she received this message in her paper work and it is in my chart. Michela Pitcher it is a bunch of labs. Could you please get these orders ready for her to pick up. 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

## 2016-06-23 ENCOUNTER — Other Ambulatory Visit: Payer: Self-pay | Admitting: Family Medicine

## 2016-06-23 NOTE — Telephone Encounter (Signed)
Patient requesting refill. 

## 2016-07-06 ENCOUNTER — Other Ambulatory Visit: Payer: Self-pay

## 2016-07-06 MED ORDER — ESTRADIOL 1 MG PO TABS: 1.0000 mg | ORAL_TABLET | Freq: Every day | ORAL | Status: DC

## 2016-07-10 ENCOUNTER — Encounter: Payer: Self-pay | Admitting: Obstetrics and Gynecology

## 2016-07-11 ENCOUNTER — Other Ambulatory Visit: Payer: Self-pay

## 2016-07-11 DIAGNOSIS — R232 Flushing: Secondary | ICD-10-CM

## 2016-07-11 MED ORDER — ESTRADIOL 1 MG PO TABS: 1.0000 mg | ORAL_TABLET | Freq: Every day | ORAL | Status: DC

## 2016-07-12 ENCOUNTER — Ambulatory Visit: Payer: Managed Care, Other (non HMO) | Admitting: Obstetrics and Gynecology

## 2016-07-13 ENCOUNTER — Other Ambulatory Visit: Payer: Self-pay | Admitting: Family Medicine

## 2016-07-14 ENCOUNTER — Encounter: Payer: Self-pay | Admitting: Family Medicine

## 2016-07-14 LAB — VITAMIN B12: VITAMIN B 12: 563 pg/mL (ref 211–946)

## 2016-07-14 LAB — CBC
HEMATOCRIT: 41.1 % (ref 34.0–46.6)
HEMOGLOBIN: 13.7 g/dL (ref 11.1–15.9)
MCH: 30.7 pg (ref 26.6–33.0)
MCHC: 33.3 g/dL (ref 31.5–35.7)
MCV: 92 fL (ref 79–97)
Platelets: 345 10*3/uL (ref 150–379)
RBC: 4.46 x10E6/uL (ref 3.77–5.28)
RDW: 13.3 % (ref 12.3–15.4)
WBC: 6.1 10*3/uL (ref 3.4–10.8)

## 2016-07-14 LAB — TSH: TSH: 2.86 u[IU]/mL (ref 0.450–4.500)

## 2016-07-14 LAB — VITAMIN D 25 HYDROXY (VIT D DEFICIENCY, FRACTURES): VIT D 25 HYDROXY: 50.2 ng/mL (ref 30.0–100.0)

## 2016-08-05 ENCOUNTER — Other Ambulatory Visit: Payer: Self-pay | Admitting: Family Medicine

## 2016-08-05 NOTE — Telephone Encounter (Signed)
Patient requesting refill of her Levothyroxine.

## 2016-08-15 ENCOUNTER — Encounter: Payer: Self-pay | Admitting: Obstetrics and Gynecology

## 2016-08-16 ENCOUNTER — Ambulatory Visit: Payer: Managed Care, Other (non HMO) | Admitting: Obstetrics and Gynecology

## 2016-09-01 ENCOUNTER — Ambulatory Visit (INDEPENDENT_AMBULATORY_CARE_PROVIDER_SITE_OTHER): Payer: Managed Care, Other (non HMO) | Admitting: Obstetrics and Gynecology

## 2016-09-01 ENCOUNTER — Encounter: Payer: Self-pay | Admitting: Obstetrics and Gynecology

## 2016-09-01 VITALS — BP 107/62 | HR 67 | Ht 66.0 in | Wt 167.2 lb

## 2016-09-01 DIAGNOSIS — Z9079 Acquired absence of other genital organ(s): Secondary | ICD-10-CM | POA: Diagnosis not present

## 2016-09-01 DIAGNOSIS — E8940 Asymptomatic postprocedural ovarian failure: Secondary | ICD-10-CM | POA: Diagnosis not present

## 2016-09-01 DIAGNOSIS — Z90722 Acquired absence of ovaries, bilateral: Secondary | ICD-10-CM

## 2016-09-01 DIAGNOSIS — Z90710 Acquired absence of both cervix and uterus: Secondary | ICD-10-CM | POA: Diagnosis not present

## 2016-09-01 DIAGNOSIS — Z79890 Hormone replacement therapy (postmenopausal): Principal | ICD-10-CM

## 2016-09-01 NOTE — Progress Notes (Signed)
Chief complaint: 1. Follow-up on ERT  Status post TAH/BSO in October 2016. Currently on estradiol 1 mg a day for vasomotor symptoms. Patient has had resolution of vasomotor symptoms. She is not experiencing any side effects.  OBJECTIVE: BP 107/62   Pulse 67   Ht 5\' 6"  (1.676 m)   Wt 167 lb 3.2 oz (75.8 kg)   LMP 09/29/2015 Comment: upreg neg  BMI 26.99 kg/m  Physical exam-deferred  ASSESSMENT: 1. Surgical menopause, with excellent response to ERT  PLAN: 1. Continue with estradiol 1 mg daily 2. Recommend calcium 600 mg twice a day and vitamin D 400 international units twice a day 3. Recommend weightbearing exercise 4. Return as needed for any gynecologic issues 5. Continue with routine wellness exams with Dr. Ancil Boozer  A total of 15 minutes were spent face-to-face with the patient during this encounter and over half of that time dealt with counseling and coordination of care.  [name], MD  Note: This dictation was prepared with Dragon dictation along with smaller phrase technology. Any transcriptional errors that result from this process are unintentional.

## 2016-09-01 NOTE — Progress Notes (Signed)
   Subjective:    Patient ID: DEDRA Hardin, female    DOB: [birth year], 50 y.o.   MRN: [id number]  CC: 6 week follow up ERT   HPI Mrs. Monica Hardin is a 50 y.o. female s/p TVH-BSO presenting for follow up for ERT. Patient states that all vasomotor symptoms have resolve while on Estrace. Denies any adverse reactions such as headaches, nausea, or breast tenderness. Patient is satisfied with medication.    Review of Systems See HPI     Objective:   Physical Exam BP 107/62   Pulse 67   Ht 5\' 6"  (1.676 m)   Wt 167 lb 3.2 oz (75.8 kg)   LMP 09/29/2015 Comment: upreg neg  BMI 26.99 kg/m   Constitutional: Well developed, well appearing female in no acute distress.  Physical exam deferred.     Assessment & Plan:   ERT seem to have resolved patient's symptoms and is tolerating the medication well.  Educated her on maintaining yearly physicals. Educated her on Vitamin D and Calcium intake. Educated her on weight bearing exercise. May follow up as needed for any concerns  Grayland Ormond PA-S Alanda Slim Madelyne Millikan, MD  A total of 15 minutes were spent face-to-face with the patient during this encounter and over half of that time dealt with counseling and coordination of care.   I have seen, interviewed, and examined the patient in conjunction with the [facility].A. student and affirm the diagnosis and management plan. Tanaja Ganger A. Adley Mazurowski, MD, FACOG   Note: This dictation was prepared with Dragon dictation along with smaller phrase technology. Any transcriptional errors that result from this process are unintentional.

## 2016-09-01 NOTE — Patient Instructions (Signed)
1. Continue estradiol 1 mg a day 2. Maintain routine physicals with Dr. Ancil Boozer yearly 3. Return for any GYN issues as needed

## 2016-09-19 ENCOUNTER — Other Ambulatory Visit: Payer: Self-pay

## 2016-09-19 DIAGNOSIS — R232 Flushing: Secondary | ICD-10-CM

## 2016-09-19 MED ORDER — ESTRADIOL 1 MG PO TABS
1.0000 mg | ORAL_TABLET | Freq: Every day | ORAL | 1 refills | Status: DC
Start: 2016-09-19 — End: 2017-05-01

## 2016-10-19 ENCOUNTER — Ambulatory Visit (INDEPENDENT_AMBULATORY_CARE_PROVIDER_SITE_OTHER): Payer: Managed Care, Other (non HMO) | Admitting: Family Medicine

## 2016-10-19 ENCOUNTER — Encounter: Payer: Self-pay | Admitting: Family Medicine

## 2016-10-19 VITALS — BP 118/78 | HR 106 | Temp 98.8°F | Resp 16 | Ht 66.0 in | Wt 166.2 lb

## 2016-10-19 DIAGNOSIS — R7303 Prediabetes: Secondary | ICD-10-CM | POA: Diagnosis not present

## 2016-10-19 DIAGNOSIS — Z1211 Encounter for screening for malignant neoplasm of colon: Secondary | ICD-10-CM | POA: Diagnosis not present

## 2016-10-19 DIAGNOSIS — E785 Hyperlipidemia, unspecified: Secondary | ICD-10-CM | POA: Diagnosis not present

## 2016-10-19 DIAGNOSIS — E038 Other specified hypothyroidism: Secondary | ICD-10-CM | POA: Diagnosis not present

## 2016-10-19 DIAGNOSIS — Z01419 Encounter for gynecological examination (general) (routine) without abnormal findings: Secondary | ICD-10-CM

## 2016-10-19 DIAGNOSIS — Z1231 Encounter for screening mammogram for malignant neoplasm of breast: Secondary | ICD-10-CM | POA: Diagnosis not present

## 2016-10-19 DIAGNOSIS — Z1239 Encounter for other screening for malignant neoplasm of breast: Secondary | ICD-10-CM

## 2016-10-19 MED ORDER — LEVOTHYROXINE SODIUM 100 MCG PO TABS: ORAL_TABLET | ORAL | 1 refills | Status: DC

## 2016-10-19 NOTE — Progress Notes (Signed)
Name: Monica Hardin   MRN: [id number]    DOB: 1966/06/21   Date:10/19/2016       Progress Note  Subjective  Chief Complaint  Chief Complaint  Patient presents with  . Annual Exam  . Medication Refill    levothyroxine refill needed    HPI  Well Woman: she had a hysterectomy in 2016 for treatment of abdominal pain and fibroids. She denies any abdominal pain, bladder problems or change in bowel movements. No breast problems. She needs labs done for insurance purposes. She needs refill of estrogen  Hypothyroidism: she has been taking synthroid daily , no worsening of fatigue, dry skin or constipation  Pre-diabetes: last hgbA1C was 5.7%, no polyphagia, polydipsia or polyuria   Dyslipidemia; not on medication, discussed eating fish twice a week and tree nuts every other day   Patient Active Problem List   Diagnosis Date Noted  . Surgical menopause on hormone replacement therapy 09/01/2016  . Vitamin D deficiency 04/20/2016  . Low vitamin B12 level 04/20/2016  . History of uterine leiomyoma 04/19/2016  . Sciatica, right side 04/19/2016  . Status post TAH-BSO 11/12/2015  . History of ovarian cyst 11/12/2015  . Dyslipidemia 07/04/2015  . Adult hypothyroidism 07/04/2015  . Excess weight 07/04/2015  . Disturbance of skin sensation 07/04/2015  . Lipoma of arm 12/03/2014  . CFIDS (chronic fatigue and immune dysfunction syndrome) ([place]) 05/08/2007    Past Surgical History:  Procedure Laterality Date  . ABDOMINAL HYSTERECTOMY N/A 10/05/2015   Procedure: HYSTERECTOMY ABDOMINAL;  Surgeon: [name], MD;  Location: [facility];  Service: Gynecology;  Laterality: N/A;  . FRACTURE SURGERY    . LAPAROSCOPY N/A 08/24/2015   Procedure: LAPAROSCOPY DIAGNOSTIC;  Surgeon: [name], MD;  Location: [facility];  Service: Gynecology;  Laterality: N/A;  . LEEP  [id number]   [facility]  . [place]  . SALPINGOOPHORECTOMY Bilateral 10/05/2015   Procedure: SALPINGO  OOPHORECTOMY;  Surgeon: [name], MD;  Location: [facility];  Service: Gynecology;  Laterality: Bilateral;    Family History  Problem Relation Age of Onset  . Lung cancer Mother   . Lung cancer Father   . Kidney Stones Daughter   . Asthma Son     Social History   Social History  . Marital status: Married    Spouse name: N/A  . Number of children: N/A  . 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   Social History Main Topics  . Smoking status: Never Smoker  . Smokeless tobacco: Never Used  . Alcohol use No  . Drug use: No  . Sexual activity: Yes    Partners: Male    Birth control/ protection: None     Comment: Hysterectomy   Other Topics Concern  . Not on file   Social History Narrative  . No narrative on file     Current Outpatient Prescriptions:  .  Cyanocobalamin (VITAMIN B-12) 1000 MCG SUBL, Place 1 tablet (1,000 mcg total) under the tongue 3 (three) times a week., Disp: 30 tablet, Rfl: 0 .  cyclobenzaprine (FLEXERIL) 10 MG tablet, TAKE 1/2 TO 1 TABLET BY MOUTH AT BEDTIME, Disp: 30 tablet, Rfl: 2 .  estradiol (ESTRACE) 1 MG tablet, Take 1 tablet (1 mg total) by mouth daily., Disp: 90 tablet, Rfl: 1 .  levothyroxine (SYNTHROID, LEVOTHROID) 100 MCG tablet, TAKE 1 TABLET BY MOUTH DAILY BEFORE BREAKFAST. TAKE 1 AND 1/2 TABLETSON SUNDAYS., Disp: 100 tablet,  Rfl: 0 .  Vitamin D, Ergocalciferol, (DRISDOL) 50000 units CAPS capsule, TAKE 1 CAPSULE BY MOUTH EVERY 7 DAYS., Disp: 12 capsule, Rfl: 1  No Known Allergies   ROS  Constitutional: Negative for fever or weight change.  Respiratory: Negative for cough and shortness of breath.   Cardiovascular: Negative for chest pain or palpitations.  Gastrointestinal: Negative for abdominal pain, no bowel changes.  Musculoskeletal: Negative for gait problem or joint swelling.  Skin: Negative for rash.  Neurological: Negative for dizziness or headache.  No other specific complaints in a  complete review of systems (except as listed in HPI above).  Objective  Vitals:   10/19/16 0824  BP: 118/78  Pulse: (!) 106  Resp: 16  Temp: 98.8 F (37.1 C)  TempSrc: Oral  SpO2: 97%  Weight: 166 lb 3 oz (75.4 kg)  Height: 5\' 6"  (1.676 m)    Body mass index is 26.82 kg/m.  Physical Exam  Constitutional: Patient appears well-developed and obese No distress.  HENT: Head: Normocephalic and atraumatic. Ears: B TMs ok, no erythema or effusion; Nose: Nose normal. Mouth/Throat: Oropharynx is clear and moist. No oropharyngeal exudate.  Eyes: Conjunctivae and EOM are normal. Pupils are equal, round, and reactive to light. No scleral icterus.  Neck: Normal range of motion. Neck supple. No JVD present. No thyromegaly present.  Cardiovascular: Normal rate, regular rhythm and normal heart sounds.  No murmur heard. No BLE edema. Pulmonary/Chest: Effort normal and breath sounds normal. No respiratory distress. Abdominal: Soft. Bowel sounds are normal, no distension. There is no tenderness. no masses Breast: no lumps or masses, no nipple discharge or rashes FEMALE GENITALIA:  Not done RECTAL: not done Musculoskeletal: Normal range of motion, no joint effusions. No gross deformities Neurological: he is alert and oriented to person, place, and time. No cranial nerve deficit. Coordination, balance, strength, speech and gait are normal.  Skin: Skin is warm and dry. No rash noted. No erythema.  Psychiatric: Patient has a normal mood and affect. behavior is normal. Judgment and thought content normal.  PHQ2/9: Depression screen [facility] 2/9 10/19/2016 04/19/2016 07/07/2015  Decreased Interest 0 0 0  Down, Depressed, Hopeless 0 0 0  PHQ - 2 Score 0 0 0    Fall Risk: Fall Risk  10/19/2016 04/19/2016 07/07/2015  Falls in the past year? No No No    Functional Status Survey: Is the patient deaf or have difficulty hearing?: No Does the patient have difficulty seeing, even when wearing  glasses/contacts?: No Does the patient have difficulty concentrating, remembering, or making decisions?: No Does the patient have difficulty walking or climbing stairs?: No Does the patient have difficulty dressing or bathing?: No Does the patient have difficulty doing errands alone such as visiting a doctor's office or shopping?: No    Assessment & Plan  1. Well woman exam  Discussed importance of 150 minutes of physical activity weekly, eat two servings of fish weekly, eat one serving of tree nuts ( cashews, pistachios, pecans, almonds.Marland Kitchen) every other day, eat 6 servings of fruit/vegetables daily and drink plenty of water and avoid sweet beverages.  - Hemoglobin A1c - Comprehensive metabolic panel  2. Other specified hypothyroidism  - TSH  3. Colon cancer screening  - Ambulatory referral to Gastroenterology  4. Breast cancer screening  - MM Digital Screening; Future  5. Dyslipidemia  - Lipid panel

## 2016-10-20 LAB — COMPREHENSIVE METABOLIC PANEL WITH GFR
ALT: 15 IU/L (ref 0–32)
AST: 15 IU/L (ref 0–40)
Albumin/Globulin Ratio: 1.6 (ref 1.2–2.2)
Albumin: 4.5 g/dL (ref 3.5–5.5)
Alkaline Phosphatase: 90 IU/L (ref 39–117)
BUN/Creatinine Ratio: 15 (ref 9–23)
BUN: 14 mg/dL (ref 6–24)
Bilirubin Total: 0.3 mg/dL (ref 0.0–1.2)
CO2: 25 mmol/L (ref 18–29)
Calcium: 9.8 mg/dL (ref 8.7–10.2)
Chloride: 100 mmol/L (ref 96–106)
Creatinine, Ser: 0.92 mg/dL (ref 0.57–1.00)
GFR calc Af Amer: 83 mL/min/1.73
GFR calc non Af Amer: 72 mL/min/1.73
Globulin, Total: 2.9 g/dL (ref 1.5–4.5)
Glucose: 85 mg/dL (ref 65–99)
Potassium: 4.8 mmol/L (ref 3.5–5.2)
Sodium: 141 mmol/L (ref 134–144)
Total Protein: 7.4 g/dL (ref 6.0–8.5)

## 2016-10-20 LAB — TSH: TSH: 1.5 u[IU]/mL (ref 0.450–4.500)

## 2016-10-20 LAB — LIPID PANEL
CHOL/HDL RATIO: 4.2 ratio (ref 0.0–4.4)
Cholesterol, Total: 245 mg/dL — ABNORMAL HIGH (ref 100–199)
HDL: 58 mg/dL (ref >39)
LDL CALC: 151 mg/dL — AB (ref 0–99)
Triglycerides: 182 mg/dL — ABNORMAL HIGH (ref 0–149)
VLDL CHOLESTEROL CAL: 36 mg/dL (ref 5–40)

## 2016-10-20 LAB — HEMOGLOBIN A1C
Est. average glucose Bld gHb Est-mCnc: 105 mg/dL
Hgb A1c MFr Bld: 5.3 % (ref 4.8–5.6)

## 2016-10-21 ENCOUNTER — Encounter: Payer: Self-pay | Admitting: Family Medicine

## 2016-10-24 ENCOUNTER — Encounter: Payer: Self-pay | Admitting: Family Medicine

## 2016-11-02 ENCOUNTER — Ambulatory Visit: Admission: RE | Admit: 2016-11-02 | Payer: Managed Care, Other (non HMO) | Source: Ambulatory Visit

## 2016-11-07 ENCOUNTER — Encounter: Payer: Self-pay | Admitting: General Surgery

## 2016-11-07 ENCOUNTER — Ambulatory Visit (INDEPENDENT_AMBULATORY_CARE_PROVIDER_SITE_OTHER): Payer: Managed Care, Other (non HMO) | Admitting: General Surgery

## 2016-11-07 VITALS — BP 130/78 | HR 67 | Resp 13 | Ht 66.0 in | Wt 168.0 lb

## 2016-11-07 DIAGNOSIS — Z1211 Encounter for screening for malignant neoplasm of colon: Secondary | ICD-10-CM | POA: Insufficient documentation

## 2016-11-07 MED ORDER — POLYETHYLENE GLYCOL 3350 17 GM/SCOOP PO POWD
ORAL | 0 refills | Status: DC
Start: 2016-11-07 — End: 2016-11-22

## 2016-11-07 NOTE — Progress Notes (Signed)
Patient ID: Monica Hardin, female   DOB: Aug 08, 1966, 50 y.o.   MRN: [id number]  Chief Complaint  Patient presents with  . Colonoscopy    HPI Monica Hardin is a 50 y.o. female Here today for a evaluation of a screening colonoscopy. She has not had one previously. She has no GI complaints.   I person her reviewed the patient's history. HPI  Past Medical History:  Diagnosis Date  . Chronic fatigue   . Hyperlipidemia   . Hypothyroidism   . Overweight   . Thyroid disease     Past Surgical History:  Procedure Laterality Date  . ABDOMINAL HYSTERECTOMY N/A 10/05/2015   Procedure: HYSTERECTOMY ABDOMINAL;  Surgeon: [name], MD;  Location: [facility] ORS;  Service: Gynecology;  Laterality: N/A;  . FRACTURE SURGERY    . LAPAROSCOPY N/A 08/24/2015   Procedure: LAPAROSCOPY DIAGNOSTIC;  Surgeon: [name], MD;  Location: [facility] ORS;  Service: Gynecology;  Laterality: N/A;  . LEEP  [id number]   [facility]  . [place]  . SALPINGOOPHORECTOMY Bilateral 10/05/2015   Procedure: SALPINGO OOPHORECTOMY;  Surgeon: [name], MD;  Location: [facility] ORS;  Service: Gynecology;  Laterality: Bilateral;    Family History  Problem Relation Age of Onset  . Lung cancer Mother   . Lung cancer Father   . Kidney Stones Daughter   . Asthma Son     Social History Social History  Substance Use Topics  . Smoking status: Never Smoker  . Smokeless tobacco: Never Used  . Alcohol use No    No Known Allergies  Current Outpatient Prescriptions  Medication Sig Dispense Refill  . Cyanocobalamin (VITAMIN B-12) 1000 MCG SUBL Place 1 tablet (1,000 mcg total) under the tongue 3 (three) times a week. 30 tablet 0  . cyclobenzaprine (FLEXERIL) 10 MG tablet TAKE 1/2 TO 1 TABLET BY MOUTH AT BEDTIME 30 tablet 2  . estradiol (ESTRACE) 1 MG tablet Take 1 tablet (1 mg total) by mouth daily. 90 tablet 1  . levothyroxine (SYNTHROID, LEVOTHROID) 100 MCG tablet TAKE 1 TABLET BY  MOUTH DAILY BEFORE BREAKFAST. TAKE 1 AND 1/2 TABLETSON SUNDAYS. 100 tablet 1  . Vitamin D, Ergocalciferol, (DRISDOL) 50000 units CAPS capsule TAKE 1 CAPSULE BY MOUTH EVERY 7 DAYS. 12 capsule 1  . polyethylene glycol powder (GLYCOLAX/MIRALAX) powder 255 grams one bottle for colonoscopy prep 255 g 0   No current facility-administered medications for this visit.     Review of Systems Review of Systems  Constitutional: Negative.   Respiratory: Negative.   Cardiovascular: Negative.   Gastrointestinal: Negative.     Blood pressure 130/78, pulse 67, resp. rate 13, height 5\' 6"  (1.676 m), weight 168 lb (76.2 kg), last menstrual period 09/29/2015.  Physical Exam Physical Exam  Constitutional: She is oriented to person, place, and time. She appears well-developed and well-nourished.  Cardiovascular: Normal rate, regular rhythm and normal heart sounds.   Pulmonary/Chest: Effort normal and breath sounds normal.  Neurological: She is alert and oriented to person, place, and time.  Skin: Skin is warm.    Data Reviewed Laboratory studies dated 07/13/2016 showed a CBC with a hemoglobin of 13.7 with an MCV of 92. Platelet count of 345,000, white blood cell count of 6100.  Comprehensive metabolic panel dated 0000000 showed normal electrolytes. Creatinine of 0.9 with an estimated GFR of 72. Normal liver function studies.  Assessment    Candidate for screening colonoscopy.     Plan  Colonoscopy with possible biopsy/polypectomy prn: Information regarding the procedure, including its potential risks and complications (including but not limited to perforation of the bowel, which may require emergency surgery to repair, and bleeding) was verbally given to the patient. Educational information regarding lower intestinal endoscopy was given to the patient. Written instructions for how to complete the bowel prep using Miralax were provided. The importance of drinking ample fluids to avoid  dehydration as a result of the prep emphasized.  Patient has been scheduled for a colonoscopy on 11-22-16 at [facility].   Robert Bellow 11/07/2016, 9:38 PM

## 2016-11-07 NOTE — Patient Instructions (Signed)
Colonoscopy A colonoscopy is an exam to look at the entire large intestine (colon). This exam can help find problems such as tumors, polyps, inflammation, and areas of bleeding. The exam takes about 1 hour.  LET YOUR HEALTH CARE PROVIDER KNOW ABOUT:   Any allergies you have.  All medicines you are taking, including vitamins, herbs, eye drops, creams, and over-the-counter medicines.  Previous problems you or members of your family have had with the use of anesthetics.  Any blood disorders you have.  Previous surgeries you have had.  Medical conditions you have. RISKS AND COMPLICATIONS  Generally, this is a safe procedure. However, as with any procedure, complications can occur. Possible complications include:  Bleeding.  Tearing or rupture of the colon wall.  Reaction to medicines given during the exam.  Infection (rare). BEFORE THE PROCEDURE   Ask your health care provider about changing or stopping your regular medicines.  You may be prescribed an oral bowel prep. This involves drinking a large amount of medicated liquid, starting the day before your procedure. The liquid will cause you to have multiple loose stools until your stool is almost clear or light green. This cleans out your colon in preparation for the procedure.  Do not eat or drink anything else once you have started the bowel prep, unless your health care provider tells you it is safe to do so.  Arrange for someone to drive you home after the procedure. PROCEDURE   You will be given medicine to help you relax (sedative).  You will lie on your side with your knees bent.  A long, flexible tube with a light and camera on the end (colonoscope) will be inserted through the rectum and into the colon. The camera sends video back to a computer screen as it moves through the colon. The colonoscope also releases carbon dioxide gas to inflate the colon. This helps your health care provider see the area better.  During  the exam, your health care provider may take a small tissue sample (biopsy) to be examined under a microscope if any abnormalities are found.  The exam is finished when the entire colon has been viewed. AFTER THE PROCEDURE   Do not drive for 24 hours after the exam.  You may have a small amount of blood in your stool.  You may pass moderate amounts of gas and have mild abdominal cramping or bloating. This is caused by the gas used to inflate your colon during the exam.  Ask when your test results will be ready and how you will get your results. Make sure you get your test results.   This information is not intended to replace advice given to you by your health care provider. Make sure you discuss any questions you have with your health care provider.   Document Released: 12/09/2000 Document Revised: 10/02/2013 Document Reviewed: 08/19/2013 Elsevier Interactive Patient Education 2016 Elsevier Inc.  

## 2016-11-21 ENCOUNTER — Ambulatory Visit: Payer: Managed Care, Other (non HMO)

## 2016-11-22 ENCOUNTER — Ambulatory Visit: Payer: Managed Care, Other (non HMO) | Admitting: Anesthesiology

## 2016-11-22 ENCOUNTER — Ambulatory Visit
Admission: RE | Admit: 2016-11-22 | Discharge: 2016-11-22 | Disposition: A | Discharge status: Home / Self Care | Payer: Managed Care, Other (non HMO) | Source: Ambulatory Visit | Attending: General Surgery | Admitting: General Surgery

## 2016-11-22 ENCOUNTER — Encounter
Admission: RE | Disposition: A | Discharge status: Home / Self Care | Payer: Self-pay | Source: Ambulatory Visit | Attending: General Surgery

## 2016-11-22 ENCOUNTER — Encounter: Payer: Self-pay | Admitting: *Deleted

## 2016-11-22 DIAGNOSIS — K621 Rectal polyp: Secondary | ICD-10-CM | POA: Diagnosis not present

## 2016-11-22 DIAGNOSIS — E785 Hyperlipidemia, unspecified: Secondary | ICD-10-CM | POA: Diagnosis not present

## 2016-11-22 DIAGNOSIS — E039 Hypothyroidism, unspecified: Secondary | ICD-10-CM | POA: Diagnosis not present

## 2016-11-22 DIAGNOSIS — K624 Stenosis of anus and rectum: Secondary | ICD-10-CM | POA: Diagnosis not present

## 2016-11-22 DIAGNOSIS — Z90710 Acquired absence of both cervix and uterus: Secondary | ICD-10-CM | POA: Insufficient documentation

## 2016-11-22 DIAGNOSIS — Z1211 Encounter for screening for malignant neoplasm of colon: Secondary | ICD-10-CM

## 2016-11-22 HISTORY — PX: COLONOSCOPY WITH PROPOFOL: SHX5780

## 2016-11-22 SURGERY — COLONOSCOPY WITH PROPOFOL
Anesthesia: General

## 2016-11-22 MED ORDER — MIDAZOLAM HCL 5 MG/5ML IJ SOLN
INTRAMUSCULAR | Status: DC | PRN
  Administered 2016-11-22: 1 mg via INTRAVENOUS

## 2016-11-22 MED ORDER — PROPOFOL 500 MG/50ML IV EMUL
INTRAVENOUS | Status: DC | PRN
  Administered 2016-11-22: 120 ug/kg/min via INTRAVENOUS

## 2016-11-22 MED ORDER — SODIUM CHLORIDE 0.9 % IV SOLN
INTRAVENOUS | Status: DC
  Administered 2016-11-22: 1000 mL via INTRAVENOUS

## 2016-11-22 MED ORDER — LIDOCAINE 2% (20 MG/ML) 5 ML SYRINGE
INTRAMUSCULAR | Status: DC | PRN
  Administered 2016-11-22: 25 mg via INTRAVENOUS

## 2016-11-22 MED ORDER — PROPOFOL 10 MG/ML IV BOLUS
INTRAVENOUS | Status: DC | PRN
  Administered 2016-11-22: 70 mg via INTRAVENOUS

## 2016-11-22 NOTE — Op Note (Signed)
[facility] Patient Name: Monica Hardin Procedure Date: 11/22/2016 1:50 PM MRN: [id number] Account #: [id number] Date of Birth: 25-Jun-1966 Admit Type: Outpatient Age: 50 Room: [facility] ENDO ROOM 4 Gender: Female Note Status: Finalized Procedure:            Colonoscopy Indications:          Screening for colorectal malignant neoplasm Providers:            Robert Bellow, MD Referring MD:         Bethena Roys. Sowles, MD (Referring MD) Medicines:            Monitored Anesthesia Care Complications:        No immediate complications. Procedure:            Pre-Anesthesia Assessment:                       - Prior to the procedure, a History and Physical was                        performed, and patient medications, allergies and                        sensitivities were reviewed. The patient's tolerance of                        previous anesthesia was reviewed.                       - The risks and benefits of the procedure and the                        sedation options and risks were discussed with the                        patient. All questions were answered and informed                        consent was obtained.                       After obtaining informed consent, the colonoscope was                        passed under direct vision. Throughout the procedure,                        the patient's blood pressure, pulse, and oxygen                        saturations were monitored continuously. The                        Colonoscope was introduced through the anus and                        advanced to the the cecum, identified by appendiceal                        orifice and ileocecal valve. The colonoscopy was  performed without difficulty. The patient tolerated the                        procedure well. The quality of the bowel preparation                        was excellent. Findings:      A 5 mm polyp was found in  the rectum. The polyp was sessile. Biopsies       were taken with a cold forceps for histology.      The retroflexed view of the distal rectum and anal verge was normal and       showed no anal or rectal abnormalities. Impression:           - One 5 mm polyp in the rectum. Biopsied.                       - The distal rectum and anal verge are normal on                        retroflexion view. Recommendation:       - Telephone endoscopist for pathology results in 1 week. Procedure Code(s):    --- Professional ---                       [phone], Colonoscopy, flexible; with biopsy, single or                        multiple Diagnosis Code(s):    --- Professional ---                       Z12.11, Encounter for screening for malignant neoplasm                        of colon                       K62.1, Rectal polyp CPT copyright 2016 American Medical Association. All rights reserved. The codes documented in this report are preliminary and upon coder review may  be revised to meet current compliance requirements. Robert Bellow, MD 11/22/2016 2:14:03 PM This report has been signed electronically. Number of Addenda: 0 Note Initiated On: 11/22/2016 1:50 PM Scope Withdrawal Time: 0 hours 10 minutes 23 seconds  Total Procedure Duration: 0 hours 17 minutes 3 seconds       [facility]

## 2016-11-22 NOTE — Transfer of Care (Signed)
Immediate Anesthesia Transfer of Care Note  Patient: Monica Hardin  Procedure(s) Performed: Procedure(s): COLONOSCOPY WITH PROPOFOL (N/A)  Patient Location: Endoscopy Unit  Anesthesia Type:General  Level of Consciousness: awake  Airway & Oxygen Therapy: Patient Spontanous Breathing and Patient connected to nasal cannula oxygen  Post-op Assessment: Report given to RN and Post -op Vital signs reviewed and stable  Post vital signs: Reviewed  Last Vitals:  Vitals:   11/22/16 1316 11/22/16 1416  BP: 127/71 (!) 93/55  Pulse: 78 76  Resp: 18 18  Temp: 37 C (!) 35.8 C    Last Pain:  Vitals:   11/22/16 1316  TempSrc: Tympanic      Patients Stated Pain Goal: 0 (0000000 0000000)  Complications: No apparent anesthesia complications

## 2016-11-22 NOTE — Anesthesia Postprocedure Evaluation (Signed)
Anesthesia Post Note  Patient: Monica Hardin  Procedure(s) Performed: Procedure(s) (LRB): COLONOSCOPY WITH PROPOFOL (N/A)  Patient location during evaluation: PACU Anesthesia Type: General Level of consciousness: awake and alert and oriented Pain management: pain level controlled Vital Signs Assessment: post-procedure vital signs reviewed and stable Respiratory status: spontaneous breathing, nonlabored ventilation and respiratory function stable Cardiovascular status: blood pressure returned to baseline and stable Postop Assessment: no signs of nausea or vomiting Anesthetic complications: no    Last Vitals:  Vitals:   11/22/16 1436 11/22/16 1446  BP: 106/61 (!) 109/57  Pulse: 63 65  Resp: 14 14  Temp:      Last Pain:  Vitals:   11/22/16 1414  TempSrc: Tympanic                 [name]

## 2016-11-22 NOTE — Anesthesia Preprocedure Evaluation (Signed)
Anesthesia Evaluation  Patient identified by MRN, date of birth, ID band Patient awake    Reviewed: Allergy & Precautions, NPO status , Patient's Chart, lab work & pertinent test results  History of Anesthesia Complications Negative for: history of anesthetic complications  Airway Mallampati: II  TM Distance: >3 FB Neck ROM: Full    Dental no notable dental hx.    Pulmonary neg pulmonary ROS, neg sleep apnea, neg COPD,    breath sounds clear to auscultation- rhonchi (-) wheezing      Cardiovascular Exercise Tolerance: Good (-) hypertension(-) CAD and (-) Past MI  Rhythm:Regular Rate:Normal - Systolic murmurs and - Diastolic murmurs    Neuro/Psych negative psych ROS   GI/Hepatic negative GI ROS, Neg liver ROS,   Endo/Other  neg diabetesHypothyroidism   Renal/GU negative Renal ROS     Musculoskeletal negative musculoskeletal ROS (+)   Abdominal (+) - obese,   Peds  Hematology negative hematology ROS (+)   Anesthesia Other Findings Past Medical History: No date: Chronic fatigue No date: Hyperlipidemia No date: Hypothyroidism No date: Overweight No date: Thyroid disease   Reproductive/Obstetrics                             Anesthesia Physical Anesthesia Plan  ASA: II  Anesthesia Plan: General   Post-op Pain Management:    Induction: Intravenous  Airway Management Planned: Natural Airway  Additional Equipment:   Intra-op Plan:   Post-operative Plan:   Informed Consent: I have reviewed the patients History and Physical, chart, labs and discussed the procedure including the risks, benefits and alternatives for the proposed anesthesia with the patient or authorized representative who has indicated his/her understanding and acceptance.   Dental advisory given  Plan Discussed with: CRNA and Anesthesiologist  Anesthesia Plan Comments:         Anesthesia Quick  Evaluation

## 2016-11-22 NOTE — H&P (Signed)
No change in clinical exam or history. For screening colonoscopy. Tolerated prep well.

## 2016-11-23 ENCOUNTER — Encounter: Payer: Self-pay | Admitting: General Surgery

## 2016-11-24 LAB — SURGICAL PATHOLOGY

## 2016-11-25 ENCOUNTER — Encounter: Payer: Self-pay | Admitting: General Surgery

## 2016-11-25 ENCOUNTER — Telehealth: Payer: Self-pay

## 2016-11-25 NOTE — Telephone Encounter (Signed)
Notified patient as instructed, patient pleased. Discussed follow-up appointments, patient agrees  

## 2016-11-25 NOTE — Telephone Encounter (Signed)
-----   Message from Robert Bellow, MD sent at 11/25/2016 10:28 AM EST -----  Please notify the patient the polyp was fine. No need for repeat exam until 10 years.  ----- Message ----- From: Interface, Lab In Three Zero One Sent: 11/24/2016  11:44 AM To: Robert Bellow, MD

## 2016-12-13 ENCOUNTER — Ambulatory Visit
Admission: RE | Admit: 2016-12-13 | Discharge: 2016-12-13 | Disposition: A | Discharge status: Home / Self Care | Payer: Managed Care, Other (non HMO) | Source: Ambulatory Visit | Attending: Family Medicine | Admitting: Family Medicine

## 2016-12-13 DIAGNOSIS — Z1231 Encounter for screening mammogram for malignant neoplasm of breast: Secondary | ICD-10-CM | POA: Diagnosis not present

## 2016-12-13 DIAGNOSIS — Z1239 Encounter for other screening for malignant neoplasm of breast: Secondary | ICD-10-CM

## 2017-01-20 ENCOUNTER — Encounter: Payer: Self-pay | Admitting: Family Medicine

## 2017-03-21 ENCOUNTER — Encounter: Payer: Self-pay | Admitting: Family Medicine

## 2017-03-21 ENCOUNTER — Ambulatory Visit (INDEPENDENT_AMBULATORY_CARE_PROVIDER_SITE_OTHER): Payer: Commercial Managed Care - PPO | Admitting: Family Medicine

## 2017-03-21 VITALS — BP 106/60 | HR 83 | Temp 97.8°F | Resp 16 | Ht 66.0 in | Wt 176.6 lb

## 2017-03-21 DIAGNOSIS — E785 Hyperlipidemia, unspecified: Secondary | ICD-10-CM | POA: Diagnosis not present

## 2017-03-21 DIAGNOSIS — R7303 Prediabetes: Secondary | ICD-10-CM

## 2017-03-21 DIAGNOSIS — R140 Abdominal distension (gaseous): Secondary | ICD-10-CM

## 2017-03-21 DIAGNOSIS — R110 Nausea: Secondary | ICD-10-CM

## 2017-03-21 DIAGNOSIS — R1031 Right lower quadrant pain: Secondary | ICD-10-CM | POA: Diagnosis not present

## 2017-03-21 DIAGNOSIS — M545 Low back pain, unspecified: Secondary | ICD-10-CM

## 2017-03-21 DIAGNOSIS — E038 Other specified hypothyroidism: Secondary | ICD-10-CM

## 2017-03-21 MED ORDER — VITAMIN D 50 MCG (2000 UT) PO CAPS: 1.0000 | ORAL_CAPSULE | Freq: Every day | ORAL | 0 refills | Status: DC

## 2017-03-21 NOTE — Addendum Note (Signed)
Addended by: Lolita Rieger D on: 03/21/2017 11:39 AM   Modules accepted: Orders

## 2017-03-21 NOTE — Addendum Note (Signed)
Addended by: Lolita Rieger D on: 03/21/2017 11:33 AM   Modules accepted: Orders

## 2017-03-21 NOTE — Progress Notes (Signed)
Name: Monica Hardin   MRN: [id number]    DOB: 1966/08/10   Date:03/21/2017       Progress Note  Subjective  Chief Complaint  Chief Complaint  Patient presents with  . Abdominal Pain    Onset- Since Christmas, attacks are off and on and will last for about a hour. Lower Right Abdominal pain. Patient experiences bloating, nausea and radiates lower right back pain. Denies any urinary symptoms. 800 mg Ibuprofen for symptom relief.     HPI  RLQ pain: she has noticed intermittent right lower quadrant pain intermittently, pain is described as sharp, associated with nausea, bloating and pain radiates from right to left abdomen in waves and also to her right lower back and is very uncomfortable when it happens. She states episodes more frequent and intense over the past month. She had 4 episodes in the past month, and it can last several hours. She states Ibuprofen seems to improve symptoms. No urinary symptoms such as hematuria, frequency, nocturia or urgency. Bowel movements seems to slow down when the pain is present. Colonoscopy was done in Nov 2017 only one rectal polyp / hyperplastic.   Hypothyroidism: she has been taking synthroid daily , no worsening of fatigue, dry skin, she has noticed some change in bowel movement with episodes of abdominal pain ( feels constipated - has to strain with the pain)   Pre-diabetes: last hgbA1C was 5.3% with change of life style, but she has gained weight since,  no polyphagia, polydipsia or polyuria   Dyslipidemia; not on medication, discussed eating fish twice a week and tree nuts every other day, last LDL was 151 discussed statin therapy, but she wants to hold off for now. We will recheck labs   Patient Active Problem List   Diagnosis Date Noted  . Encounter for screening colonoscopy 11/07/2016  . Surgical menopause on hormone replacement therapy 09/01/2016  . Vitamin D deficiency 04/20/2016  . Low vitamin B12 level 04/20/2016  . History of uterine  leiomyoma 04/19/2016  . Sciatica, right side 04/19/2016  . Status post TAH-BSO 11/12/2015  . History of ovarian cyst 11/12/2015  . Dyslipidemia 07/04/2015  . Adult hypothyroidism 07/04/2015  . Excess weight 07/04/2015  . Disturbance of skin sensation 07/04/2015  . Lipoma of arm 12/03/2014  . CFIDS (chronic fatigue and immune dysfunction syndrome) ([place]) 05/08/2007    Past Surgical History:  Procedure Laterality Date  . ABDOMINAL HYSTERECTOMY N/A 10/05/2015   Procedure: HYSTERECTOMY ABDOMINAL;  Surgeon: [name], MD;  Location: [facility] ORS;  Service: Gynecology;  Laterality: N/A;  . COLONOSCOPY WITH PROPOFOL N/A 11/22/2016   Procedure: COLONOSCOPY WITH PROPOFOL;  Surgeon: Robert Bellow, MD;  Location: [facility] ENDOSCOPY;  Service: Endoscopy;  Laterality: N/A;  . FRACTURE SURGERY    . LAPAROSCOPY N/A 08/24/2015   Procedure: LAPAROSCOPY DIAGNOSTIC;  Surgeon: [name], MD;  Location: [facility] ORS;  Service: Gynecology;  Laterality: N/A;  . LEEP  [id number]   [facility]  . [place]  . SALPINGOOPHORECTOMY Bilateral 10/05/2015   Procedure: SALPINGO OOPHORECTOMY;  Surgeon: [name], MD;  Location: [facility] ORS;  Service: Gynecology;  Laterality: Bilateral;    Family History  Problem Relation Age of Onset  . Lung cancer Mother   . Lung cancer Father   . Kidney Stones Daughter   . Asthma Son   . Breast cancer Paternal Aunt 47    Social History   Social History  . Marital status: Married    Spouse  name: N/A  . Number of children: N/A  . 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   Social History Main Topics  . Smoking status: Never Smoker  . Smokeless tobacco: Never Used  . Alcohol use No  . Drug use: No  . Sexual activity: Yes    Partners: Male    Birth control/ protection: None     Comment: Hysterectomy   Other Topics Concern  . Not on file   Social History Narrative  . No narrative on file      Current Outpatient Prescriptions:  .  estradiol (ESTRACE) 1 MG tablet, Take 1 tablet (1 mg total) by mouth daily., Disp: 90 tablet, Rfl: 1 .  levothyroxine (SYNTHROID, LEVOTHROID) 100 MCG tablet, TAKE 1 TABLET BY MOUTH DAILY BEFORE BREAKFAST. TAKE 1 AND 1/2 TABLETSON SUNDAYS., Disp: 100 tablet, Rfl: 1 .  Cholecalciferol (VITAMIN D) 2000 units CAPS, Take 1 capsule (2,000 Units total) by mouth daily., Disp: 30 capsule, Rfl: 0 .  Cyanocobalamin (VITAMIN B-12) 1000 MCG SUBL, Place 1 tablet (1,000 mcg total) under the tongue 3 (three) times a week. (Patient not taking: Reported on 03/21/2017), Disp: 30 tablet, Rfl: 0 .  cyclobenzaprine (FLEXERIL) 10 MG tablet, TAKE 1/2 TO 1 TABLET BY MOUTH AT BEDTIME (Patient not taking: Reported on 03/21/2017), Disp: 30 tablet, Rfl: 2  No Known Allergies   ROS  Constitutional: Negative for fever, positive for  weight change.  Respiratory: Negative for cough and shortness of breath.   Cardiovascular: Negative for chest pain or palpitations.  Gastrointestinal: Negative for abdominal pain, no bowel changes.  Musculoskeletal: Negative for gait problem or joint swelling.  Skin: Negative for rash.  Neurological: Negative for dizziness or headache.  No other specific complaints in a complete review of systems (except as listed in HPI above).  Objective  Vitals:   03/21/17 1041  BP: 106/60  Pulse: 83  Resp: 16  Temp: 97.8 F (36.6 C)  TempSrc: Oral  SpO2: 94%  Weight: 176 lb 9.6 oz (80.1 kg)  Height: 5\' 6"  (1.676 m)    Body mass index is 28.5 kg/m.  Physical Exam  Constitutional: Patient appears well-developed and well-nourished. Obese  No distress.  HEENT: head atraumatic, normocephalic, pupils equal and reactive to light,  neck supple, throat within normal limits Cardiovascular: Normal rate, regular rhythm and normal heart sounds.  No murmur heard. No BLE edema. Pulmonary/Chest: Effort normal and breath sounds normal. No respiratory  distress. Abdominal: Soft.  There is tenderness right lower quadrant, voluntary guarding but no rebound tenderness. Normal bowel sounds Psychiatric: Patient has a normal mood and affect. behavior is normal. Judgment and thought content normal. Muscular Skeletal: pain during palpation of right lower back, normal rom of hip  PHQ2/9: Depression screen [facility] 2/9 03/21/2017 10/19/2016 04/19/2016 07/07/2015  Decreased Interest 0 0 0 0  Down, Depressed, Hopeless 0 0 0 0  PHQ - 2 Score 0 0 0 0    Fall Risk: Fall Risk  03/21/2017 10/19/2016 04/19/2016 07/07/2015  Falls in the past year? No No No No    Functional Status Survey: Is the patient deaf or have difficulty hearing?: No Does the patient have difficulty seeing, even when wearing glasses/contacts?: No Does the patient have difficulty concentrating, remembering, or making decisions?: No Does the patient have difficulty walking or climbing stairs?: No Does the patient have difficulty dressing or bathing?: No Does the patient have difficulty doing errands alone such as visiting a doctor's office  or shopping?: No   Assessment & Plan  1. Right lower quadrant abdominal pain  - CT Abdomen Pelvis Wo Contrast; Future - CBC with Differential/Platelet - COMPLETE METABOLIC PANEL WITH GFR  2. Nausea  - CT Abdomen Pelvis Wo Contrast; Future - CBC with Differential/Platelet - COMPLETE METABOLIC PANEL WITH GFR  3. Bloating  - CT Abdomen Pelvis Wo Contrast; Future - CBC with Differential/Platelet - COMPLETE METABOLIC PANEL WITH GFR  4. Prediabetes  - Hemoglobin A1c  5. Dyslipidemia  - Lipid panel  6. Other specified hypothyroidism  - TSH  7. Acute right-sided low back pain without sciatica  She may be having radiculitis radiating to RLQ, however not sure if it would be the cause of bloating and change of bowel movement. If normal CT and labs we will try a round of prednisone to see if symptoms will resolve and consider MRI lumbar  spine

## 2017-03-22 NOTE — Addendum Note (Signed)
Addended by: Saunders Glance A on: 03/22/2017 10:37 AM   Modules accepted: Orders

## 2017-03-25 LAB — CBC WITH DIFFERENTIAL/PLATELET
BASOS: 0 %
Basophils Absolute: 0 10*3/uL (ref 0.0–0.2)
EOS (ABSOLUTE): 0.1 10*3/uL (ref 0.0–0.4)
EOS: 2 %
HEMATOCRIT: 40.3 % (ref 34.0–46.6)
Hemoglobin: 14.1 g/dL (ref 11.1–15.9)
IMMATURE GRANULOCYTES: 0 %
Immature Grans (Abs): 0 10*3/uL (ref 0.0–0.1)
LYMPHS ABS: 2.6 10*3/uL (ref 0.7–3.1)
Lymphs: 36 %
MCH: 31.5 pg (ref 26.6–33.0)
MCHC: 35 g/dL (ref 31.5–35.7)
MCV: 90 fL (ref 79–97)
MONOS ABS: 0.4 10*3/uL (ref 0.1–0.9)
Monocytes: 5 %
NEUTROS PCT: 57 %
Neutrophils Absolute: 4.1 10*3/uL (ref 1.4–7.0)
PLATELETS: 337 10*3/uL (ref 150–379)
RBC: 4.47 x10E6/uL (ref 3.77–5.28)
RDW: 13.3 % (ref 12.3–15.4)
WBC: 7.3 10*3/uL (ref 3.4–10.8)

## 2017-03-25 LAB — COMPREHENSIVE METABOLIC PANEL
A/G RATIO: 1.8 (ref 1.2–2.2)
ALBUMIN: 4.4 g/dL (ref 3.5–5.5)
ALT: 15 IU/L (ref 0–32)
AST: 17 IU/L (ref 0–40)
Alkaline Phosphatase: 72 IU/L (ref 39–117)
BILIRUBIN TOTAL: 0.4 mg/dL (ref 0.0–1.2)
BUN / CREAT RATIO: 16 (ref 9–23)
BUN: 14 mg/dL (ref 6–24)
CHLORIDE: 100 mmol/L (ref 96–106)
CO2: 24 mmol/L (ref 18–29)
Calcium: 9.3 mg/dL (ref 8.7–10.2)
Creatinine, Ser: 0.87 mg/dL (ref 0.57–1.00)
GFR calc non Af Amer: 77 mL/min/{1.73_m2} (ref >59)
GFR, EST AFRICAN AMERICAN: 89 mL/min/{1.73_m2} (ref >59)
GLOBULIN, TOTAL: 2.5 g/dL (ref 1.5–4.5)
Glucose: 83 mg/dL (ref 65–99)
Potassium: 3.8 mmol/L (ref 3.5–5.2)
SODIUM: 140 mmol/L (ref 134–144)
TOTAL PROTEIN: 6.9 g/dL (ref 6.0–8.5)

## 2017-03-25 LAB — LIPID PANEL
CHOL/HDL RATIO: 3.7 ratio (ref 0.0–4.4)
CHOLESTEROL TOTAL: 224 mg/dL — AB (ref 100–199)
HDL: 61 mg/dL (ref >39)
LDL CALC: 140 mg/dL — AB (ref 0–99)
Triglycerides: 114 mg/dL (ref 0–149)
VLDL CHOLESTEROL CAL: 23 mg/dL (ref 5–40)

## 2017-03-25 LAB — TSH: TSH: 2.78 u[IU]/mL (ref 0.450–4.500)

## 2017-03-25 LAB — HEMOGLOBIN A1C
Est. average glucose Bld gHb Est-mCnc: 105 mg/dL
Hgb A1c MFr Bld: 5.3 % (ref 4.8–5.6)

## 2017-03-26 ENCOUNTER — Inpatient Hospital Stay
Admission: EM | Admit: 2017-03-26 | Discharge: 2017-03-28 | DRG: 419 | Disposition: A | Discharge status: Home / Self Care | Payer: Commercial Managed Care - PPO | Attending: Surgery | Admitting: Surgery

## 2017-03-26 ENCOUNTER — Encounter: Payer: Self-pay | Admitting: Emergency Medicine

## 2017-03-26 ENCOUNTER — Emergency Department: Payer: Commercial Managed Care - PPO

## 2017-03-26 DIAGNOSIS — Z79899 Other long term (current) drug therapy: Secondary | ICD-10-CM | POA: Diagnosis not present

## 2017-03-26 DIAGNOSIS — G709 Myoneural disorder, unspecified: Secondary | ICD-10-CM | POA: Diagnosis present

## 2017-03-26 DIAGNOSIS — R109 Unspecified abdominal pain: Secondary | ICD-10-CM

## 2017-03-26 DIAGNOSIS — K810 Acute cholecystitis: Secondary | ICD-10-CM | POA: Diagnosis present

## 2017-03-26 DIAGNOSIS — E039 Hypothyroidism, unspecified: Secondary | ICD-10-CM | POA: Diagnosis present

## 2017-03-26 DIAGNOSIS — R5382 Chronic fatigue, unspecified: Secondary | ICD-10-CM | POA: Diagnosis present

## 2017-03-26 DIAGNOSIS — E663 Overweight: Secondary | ICD-10-CM | POA: Diagnosis present

## 2017-03-26 DIAGNOSIS — Z6827 Body mass index (BMI) 27.0-27.9, adult: Secondary | ICD-10-CM | POA: Diagnosis not present

## 2017-03-26 LAB — CBC
HCT: 40.3 % (ref 35.0–47.0)
Hemoglobin: 13.6 g/dL (ref 12.0–16.0)
MCH: 31.1 pg (ref 26.0–34.0)
MCHC: 33.8 g/dL (ref 32.0–36.0)
MCV: 92 fL (ref 80.0–100.0)
PLATELETS: 316 10*3/uL (ref 150–440)
RBC: 4.37 MIL/uL (ref 3.80–5.20)
RDW: 12.8 % (ref 11.5–14.5)
WBC: 9.4 10*3/uL (ref 3.6–11.0)

## 2017-03-26 LAB — URINALYSIS, COMPLETE (UACMP) WITH MICROSCOPIC
Bacteria, UA: NONE SEEN
Bilirubin Urine: NEGATIVE
GLUCOSE, UA: NEGATIVE mg/dL
Hgb urine dipstick: NEGATIVE
KETONES UR: NEGATIVE mg/dL
Leukocytes, UA: NEGATIVE
Nitrite: NEGATIVE
PH: 6 (ref 5.0–8.0)
PROTEIN: NEGATIVE mg/dL
RBC / HPF: NONE SEEN RBC/hpf (ref 0–5)
Specific Gravity, Urine: 1.021 (ref 1.005–1.030)

## 2017-03-26 LAB — COMPREHENSIVE METABOLIC PANEL
ALK PHOS: 59 U/L (ref 38–126)
ALT: 15 U/L (ref 14–54)
AST: 21 U/L (ref 15–41)
Albumin: 4.3 g/dL (ref 3.5–5.0)
Anion gap: 7 (ref 5–15)
BUN: 21 mg/dL — ABNORMAL HIGH (ref 6–20)
CALCIUM: 9.3 mg/dL (ref 8.9–10.3)
CO2: 27 mmol/L (ref 22–32)
CREATININE: 0.84 mg/dL (ref 0.44–1.00)
Chloride: 105 mmol/L (ref 101–111)
GFR calc Af Amer: >60 mL/min (ref >60)
GFR calc non Af Amer: >60 mL/min (ref >60)
GLUCOSE: 123 mg/dL — AB (ref 65–99)
Potassium: 4.2 mmol/L (ref 3.5–5.1)
SODIUM: 139 mmol/L (ref 135–145)
Total Bilirubin: 0.4 mg/dL (ref 0.3–1.2)
Total Protein: 7.6 g/dL (ref 6.5–8.1)

## 2017-03-26 LAB — LIPASE, BLOOD: Lipase: 21 U/L (ref 11–51)

## 2017-03-26 MED ORDER — SODIUM CHLORIDE 0.9 % IV SOLN
3.0000 g | Freq: Four times a day (QID) | INTRAVENOUS | Status: DC
  Administered 2017-03-27 – 2017-03-28 (×6): 3 g via INTRAVENOUS
  Filled 2017-03-26 (×9): qty 3

## 2017-03-26 MED ORDER — DEXTROSE IN LACTATED RINGERS 5 % IV SOLN
INTRAVENOUS | Status: DC
  Administered 2017-03-27 (×2): via INTRAVENOUS
  Filled 2017-03-26 (×2): qty 1000

## 2017-03-26 MED ORDER — MORPHINE SULFATE (PF) 2 MG/ML IV SOLN
2.0000 mg | INTRAVENOUS | Status: DC | PRN
  Administered 2017-03-27 (×3): 2 mg via INTRAVENOUS
  Filled 2017-03-26 (×3): qty 1

## 2017-03-26 MED ORDER — LEVOTHYROXINE SODIUM 50 MCG PO TABS: 50.0000 ug | ORAL_TABLET | ORAL | Status: DC

## 2017-03-26 MED ORDER — ONDANSETRON HCL 4 MG/2ML IJ SOLN
4.0000 mg | Freq: Four times a day (QID) | INTRAMUSCULAR | Status: DC | PRN
  Administered 2017-03-27: 4 mg via INTRAVENOUS
  Filled 2017-03-26: qty 2

## 2017-03-26 MED ORDER — LEVOTHYROXINE SODIUM 50 MCG PO TABS
100.0000 ug | ORAL_TABLET | Freq: Every day | ORAL | Status: DC
  Administered 2017-03-28: 100 ug via ORAL
  Filled 2017-03-26: qty 2

## 2017-03-26 MED ORDER — ONDANSETRON HCL 4 MG/2ML IJ SOLN
4.0000 mg | Freq: Once | INTRAMUSCULAR | Status: AC
  Administered 2017-03-26: 4 mg via INTRAVENOUS
  Filled 2017-03-26: qty 2

## 2017-03-26 MED ORDER — SODIUM CHLORIDE 0.9 % IV BOLUS (SEPSIS)
1000.0000 mL | Freq: Once | INTRAVENOUS | Status: AC
  Administered 2017-03-26: 1000 mL via INTRAVENOUS

## 2017-03-26 MED ORDER — ONDANSETRON 4 MG PO TBDP
4.0000 mg | ORAL_TABLET | Freq: Once | ORAL | Status: DC | PRN
  Filled 2017-03-26: qty 1

## 2017-03-26 MED ORDER — HEPARIN SODIUM (PORCINE) 5000 UNIT/ML IJ SOLN
5000.0000 [IU] | Freq: Three times a day (TID) | INTRAMUSCULAR | Status: DC
  Administered 2017-03-27 – 2017-03-28 (×3): 5000 [IU] via SUBCUTANEOUS
  Filled 2017-03-26 (×4): qty 1

## 2017-03-26 MED ORDER — IOPAMIDOL (ISOVUE-300) INJECTION 61%
100.0000 mL | Freq: Once | INTRAVENOUS | Status: AC | PRN
  Administered 2017-03-26: 100 mL via INTRAVENOUS
  Filled 2017-03-26: qty 100

## 2017-03-26 MED ORDER — ONDANSETRON HCL 4 MG PO TABS: 4.0000 mg | ORAL_TABLET | Freq: Four times a day (QID) | ORAL | Status: DC | PRN

## 2017-03-26 MED ORDER — PIPERACILLIN-TAZOBACTAM 3.375 G IVPB 30 MIN
3.3750 g | Freq: Once | INTRAVENOUS | Status: AC
  Administered 2017-03-26: 3.375 g via INTRAVENOUS
  Filled 2017-03-26 (×2): qty 50

## 2017-03-26 MED ORDER — MORPHINE SULFATE (PF) 4 MG/ML IV SOLN
4.0000 mg | Freq: Once | INTRAVENOUS | Status: AC
  Administered 2017-03-26: 4 mg via INTRAVENOUS
  Filled 2017-03-26: qty 1

## 2017-03-26 NOTE — H&P (Signed)
Monica Hardin is an 51 y.o. female.    Chief Complaint: Right upper quadrant pain  HPI: This patient with one day of right upper quadrant pain but she has had multiple episodes similar to this over the last month. This wound has been unrelenting she's had epigastric and right upper quadrant pain for several hours and is only been improved with morphine. She's had no fevers or chills she is nauseated but has not vomited she has normal bowel movements. She does have some back pain as well and no dysuria.  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 Chronic fatigue   . Hyperlipidemia   . Hypothyroidism   . Overweight   . Thyroid disease     Past Surgical History:  Procedure Laterality Date  . ABDOMINAL HYSTERECTOMY N/A 10/05/2015   Procedure: HYSTERECTOMY ABDOMINAL;  Surgeon: [name], MD;  Location: [facility];  Service: Gynecology;  Laterality: N/A;  . COLONOSCOPY WITH PROPOFOL N/A 11/22/2016   Procedure: COLONOSCOPY WITH PROPOFOL;  Surgeon: Robert Bellow, MD;  Location: [facility];  Service: Endoscopy;  Laterality: N/A;  . FRACTURE SURGERY    . LAPAROSCOPY N/A 08/24/2015   Procedure: LAPAROSCOPY DIAGNOSTIC;  Surgeon: [name], MD;  Location: [facility];  Service: Gynecology;  Laterality: N/A;  . LEEP  [id number]   [facility]  . [place]  . SALPINGOOPHORECTOMY Bilateral 10/05/2015   Procedure: SALPINGO OOPHORECTOMY;  Surgeon: [name], MD;  Location: [facility];  Service: Gynecology;  Laterality: Bilateral;    Family History  Problem Relation Age of Onset  . Lung cancer Mother   . Lung cancer Father   . Kidney Stones Daughter   . Asthma Son   . Breast cancer Paternal Aunt 38   Social History:  reports that she has never smoked. She has never used smokeless tobacco. She reports that she does not drink alcohol or use drugs.  Allergies: No  Known Allergies   (Not in a hospital admission)   Review of Systems  Constitutional: Negative.   HENT: Negative.   Eyes: Negative.   Respiratory: Negative.   Cardiovascular: Negative.   Gastrointestinal: Positive for abdominal pain and nausea. Negative for blood in stool, constipation, diarrhea, heartburn, melena and vomiting.  Genitourinary: Negative.   Musculoskeletal: Negative.   Skin: Negative.   Neurological: Negative.   Endo/Heme/Allergies: Negative.   Psychiatric/Behavioral: Negative.      Physical Exam:  BP (!) 145/70   Pulse 70   Temp 98.1 F (36.7 C) (Oral)   Resp 18   Ht '5\' 6"'  (1.676 m)   Wt 170 lb (77.1 kg)   LMP 10/01/2015 (Exact Date)   SpO2 100%   BMI 27.44 kg/m   Physical Exam  Constitutional: She is oriented to person, place, and time and well-developed, well-nourished, and in no distress. No distress.  HENT:  Head: Normocephalic and atraumatic.  Eyes: Pupils are equal, round, and reactive to light. Right eye exhibits no discharge. Left eye exhibits no discharge. No scleral icterus.  Neck: Normal range of motion.  Cardiovascular: Normal rate, regular rhythm and normal heart sounds.   Pulmonary/Chest: Effort normal and breath sounds normal. No respiratory distress. She has no wheezes. She has no rales.  Abdominal: Soft. She exhibits no distension. There is tenderness. There is no rebound and no guarding.  Tenderness maximal in the right upper quadrant with a  positive Murphy sign  Musculoskeletal: Normal range of motion. She exhibits no edema or tenderness.  Lymphadenopathy:    She has no cervical adenopathy.  Neurological: She is alert and oriented to person, place, and time.  Skin: Skin is warm and dry. She is not diaphoretic. No erythema.  Psychiatric: Mood and affect normal.  Vitals reviewed.       Results for orders placed or performed during the hospital encounter of 03/26/17 (from the past 48 hour(s))  Lipase, blood     Status: None    Collection Time: 03/26/17  7:29 PM  Result Value Ref Range   Lipase 21 11 - 51 U/L  Comprehensive metabolic panel     Status: Abnormal   Collection Time: 03/26/17  7:29 PM  Result Value Ref Range   Sodium 139 135 - 145 mmol/L   Potassium 4.2 3.5 - 5.1 mmol/L   Chloride 105 101 - 111 mmol/L   CO2 27 22 - 32 mmol/L   Glucose, Bld 123 (H) 65 - 99 mg/dL   BUN 21 (H) 6 - 20 mg/dL   Creatinine, Ser 0.84 0.44 - 1.00 mg/dL   Calcium 9.3 8.9 - 10.3 mg/dL   Total Protein 7.6 6.5 - 8.1 g/dL   Albumin 4.3 3.5 - 5.0 g/dL   AST 21 15 - 41 U/L   ALT 15 14 - 54 U/L   Alkaline Phosphatase 59 38 - 126 U/L   Total Bilirubin 0.4 0.3 - 1.2 mg/dL   GFR calc non Af Amer >60 >60 mL/min   GFR calc Af Amer >60 >60 mL/min    Comment: (NOTE) The eGFR has been calculated using the CKD EPI equation. This calculation has not been validated in all clinical situations. eGFR's persistently <60 mL/min signify possible Chronic Kidney Disease.    Anion gap 7 5 - 15  CBC     Status: None   Collection Time: 03/26/17  7:29 PM  Result Value Ref Range   WBC 9.4 3.6 - 11.0 K/uL   RBC 4.37 3.80 - 5.20 MIL/uL   Hemoglobin 13.6 12.0 - 16.0 g/dL   HCT 40.3 35.0 - 47.0 %   MCV 92.0 80.0 - 100.0 fL   MCH 31.1 26.0 - 34.0 pg   MCHC 33.8 32.0 - 36.0 g/dL   RDW 12.8 11.5 - 14.5 %   Platelets 316 150 - 440 K/uL  Urinalysis, Complete w Microscopic     Status: Abnormal   Collection Time: 03/26/17  7:29 PM  Result Value Ref Range   Color, Urine YELLOW (A) YELLOW   APPearance HAZY (A) CLEAR   Specific Gravity, Urine 1.021 1.005 - 1.030   pH 6.0 5.0 - 8.0   Glucose, UA NEGATIVE NEGATIVE mg/dL   Hgb urine dipstick NEGATIVE NEGATIVE   Bilirubin Urine NEGATIVE NEGATIVE   Ketones, ur NEGATIVE NEGATIVE mg/dL   Protein, ur NEGATIVE NEGATIVE mg/dL   Nitrite NEGATIVE NEGATIVE   Leukocytes, UA NEGATIVE NEGATIVE   RBC / HPF NONE SEEN 0 - 5 RBC/hpf   WBC, UA 0-5 0 - 5 WBC/hpf   Bacteria, UA NONE SEEN NONE SEEN   Squamous  Epithelial / LPF 0-5 (A) NONE SEEN   Mucous PRESENT    Ct Abdomen Pelvis W Contrast  Result Date: 03/26/2017 CLINICAL DATA:  Onset of RIGHT lower abdomen pain 1 month ago, pain non diffuse throughout whole abdomen and worsened tonight EXAM: CT ABDOMEN AND PELVIS WITH CONTRAST TECHNIQUE: Multidetector CT imaging of the abdomen and pelvis  was performed using the standard protocol following bolus administration of intravenous contrast. Sagittal and coronal MPR images reconstructed from axial data set. CONTRAST:  135m ISOVUE-300 IOPAMIDOL (ISOVUE-300) INJECTION 61% IV. No oral contrast administered. COMPARISON:  None FINDINGS: Lower chest: Minimal dependent atelectasis at lung bases. Hepatobiliary: Gallbladder wall thickening and question minimal pericholecystic fluid. No calcified gallstones or biliary dilatation evident. Liver unremarkable. Pancreas: Normal appearance Spleen: Normal appearance Adrenals/Urinary Tract: Tiny RIGHT renal cyst. Adrenal glands, kidneys, ureters, and bladder otherwise unremarkable. Stomach/Bowel: Normal appendix. Sigmoid diverticulosis. Question minimal wall thickening of the descending sigmoid junction versus artifact from underdistention. No pericolic infiltrative changes. Stomach and remaining bowel loops unremarkable. Vascular/Lymphatic: Minimal atherosclerotic calcification at aortic bifurcation. Aorta normal caliber. No adenopathy. Reproductive: Uterus surgically absent with nonvisualization of ovaries Other: No free air free fluid.  No hernia. Musculoskeletal: Unremarkable IMPRESSION: Distal colonic diverticulosis with question minimal wall thickening of the proximal sigmoid colon but no definite pericolic infiltrative changes to suggest acute diverticulitis. Thickened gallbladder wall with question minimal pericholecystic fluid; unable to exclude acute cholecystitis, recommend sonographic assessment. Electronically Signed   By: MLavonia DanaM.D.   On: 03/26/2017 21:45   [place] Abdomen Limited Ruq  Result Date: 03/26/2017 CLINICAL DATA:  Abdominal pain. EXAM: [place] LIMITED - RIGHT UPPER QUADRANT COMPARISON:  03/26/2017 FINDINGS: Gallbladder: Multiple stones identified within the gallbladder which measure up to 7 mm. The gallbladder wall appears diffusely thickened measuring up to 6.5 mm. Positive sonographic Murphy's sign. Trace pericholecystic fluid. Common bile duct: Diameter: 4.8 mm Liver: No focal lesion identified. Within normal limits in parenchymal echogenicity. Other:  1 cm right kidney AML noted. IMPRESSION: 1. Gallbladder wall thickening, gallstones, pericholecystic fluid and positive sonographic Murphy's sign. Findings compatible with acute cholecystitis. Electronically Signed   By: TKerby MoorsM.D.   On: 03/26/2017 22:47     Assessment/Plan  Studies were personally reviewed showing signs of acute cholecystitis which is consistent with the patient's history and physical. I'm recommending admission to the hospital due to her unrelenting pain and nausea and then providing surgical care in the form of a laparoscopic cholecystectomy. Her LFTs are normal. Rationale for offering this approach was discussed with her the options of observation or an outpatient procedure reviewed as well he wished proceed with admission at this time. I discussed the risks of bleeding infection conversion to an open procedure bile duct damage bile duct leak bowel injury any of which could require further surgery and this was reviewed with them they understood and agreed to proceed. Patient has had a colonoscopy in December by Dr. BBary Castillaand I discussed her procedure being performed either by Dr. BBary Castillaor by Dr. PHampton Abbotdepending on availability and scheduling tomorrow RFlorene Glen MD, FACS

## 2017-03-26 NOTE — ED Notes (Signed)
Patient offered Zofran for nausea. Patient states that she does not want anything for nausea at this time.

## 2017-03-26 NOTE — ED Provider Notes (Signed)
[facility] Emergency Department Provider Note  ____________________________________________  Time seen: Approximately 9:16 PM  I have reviewed the triage vital signs and the nursing notes.   HISTORY  Chief Complaint Abdominal Pain and Nausea   HPI Monica Hardin is a 51 y.o. female with a history of chronic fatigue, uterine leiomyoma and ovarian cyst status post total hysterectomy and bilateral salpingo-oophorectomy in 2016, and hypothyroidism who presents for evaluation of abdominal pain. Patient reports intermittent episodes of abdominal pain for the last month. The one today is more severe than most. She was seen by her primary care doctor and has an appointment to have a CT scan tomorrow. However her pain is severe at this time which prompted her visit to the emergency room. She reports that the pain is sharp, diffuse but worse in the RLQ, constant since earlier today, severe, and associated with nausea. Pain radiates to her back. NO diarrhea or constipation, normal BM this morning. No dysuria, hematuria, vomiting, chest pain, shortness of breath, fever, vaginal bleeding or vaginal discharge. Patient reports that the prior episodes of the pain usually lasted for 1 day and were more mild in intensity. She has had a total of 4 episodes prior to this one.  Past Medical History:  Diagnosis Date  . Chronic fatigue   . Hyperlipidemia   . Hypothyroidism   . Overweight   . Thyroid disease     Patient Active Problem List   Diagnosis Date Noted  . Encounter for screening colonoscopy 11/07/2016  . Surgical menopause on hormone replacement therapy 09/01/2016  . Vitamin D deficiency 04/20/2016  . Low vitamin B12 level 04/20/2016  . History of uterine leiomyoma 04/19/2016  . Sciatica, right side 04/19/2016  . Status post TAH-BSO 11/12/2015  . History of ovarian cyst 11/12/2015  . Dyslipidemia 07/04/2015  . Adult hypothyroidism 07/04/2015  . Excess weight  07/04/2015  . Disturbance of skin sensation 07/04/2015  . Lipoma of arm 12/03/2014  . CFIDS (chronic fatigue and immune dysfunction syndrome) ([place]) 05/08/2007    Past Surgical History:  Procedure Laterality Date  . ABDOMINAL HYSTERECTOMY N/A 10/05/2015   Procedure: HYSTERECTOMY ABDOMINAL;  Surgeon: [name], MD;  Location: [facility];  Service: Gynecology;  Laterality: N/A;  . COLONOSCOPY WITH PROPOFOL N/A 11/22/2016   Procedure: COLONOSCOPY WITH PROPOFOL;  Surgeon: Robert Bellow, MD;  Location: [facility];  Service: Endoscopy;  Laterality: N/A;  . FRACTURE SURGERY    . LAPAROSCOPY N/A 08/24/2015   Procedure: LAPAROSCOPY DIAGNOSTIC;  Surgeon: [name], MD;  Location: [facility];  Service: Gynecology;  Laterality: N/A;  . LEEP  [id number]   [facility]  . [place]  . SALPINGOOPHORECTOMY Bilateral 10/05/2015   Procedure: SALPINGO OOPHORECTOMY;  Surgeon: [name], MD;  Location: [facility];  Service: Gynecology;  Laterality: Bilateral;    Prior to Admission medications   Medication Sig Start Date End Date Taking? Authorizing Provider  Cholecalciferol (VITAMIN D) 2000 units CAPS Take 1 capsule (2,000 Units total) by mouth daily. 03/21/17   Steele Sizer, MD  Cyanocobalamin (VITAMIN B-12) 1000 MCG SUBL Place 1 tablet (1,000 mcg total) under the tongue 3 (three) times a week. Patient not taking: Reported on 03/21/2017 04/20/16   Steele Sizer, MD  cyclobenzaprine (FLEXERIL) 10 MG tablet TAKE 1/2 TO 1 TABLET BY MOUTH AT BEDTIME Patient not taking: Reported on 03/21/2017 06/23/16   Steele Sizer, MD  estradiol (ESTRACE) 1 MG tablet Take 1 tablet (1 mg total)  by mouth daily. 09/19/16   Alanda Slim Defrancesco, MD  levothyroxine (SYNTHROID, LEVOTHROID) 100 MCG tablet TAKE 1 TABLET BY MOUTH DAILY BEFORE BREAKFAST. TAKE 1 AND 1/2 TABLETSON SUNDAYS. 10/19/16   Steele Sizer, MD    Allergies Patient has no known allergies.  Family History  Problem  Relation Age of Onset  . Lung cancer Mother   . Lung cancer Father   . Kidney Stones Daughter   . Asthma Son   . Breast cancer Paternal Aunt 70    Social History Social History  Substance Use Topics  . Smoking status: Never Smoker  . Smokeless tobacco: Never Used  . Alcohol use No    Review of Systems  Constitutional: Negative for fever. Eyes: Negative for visual changes. ENT: Negative for sore throat. Neck: No neck pain  Cardiovascular: Negative for chest pain. Respiratory: Negative for shortness of breath. Gastrointestinal: + abdominal pain, and nausea. No vomiting or diarrhea. Genitourinary: Negative for dysuria. Musculoskeletal: Negative for back pain. Skin: Negative for rash. Neurological: Negative for headaches, weakness or numbness. Psych: No SI or HI  ____________________________________________   PHYSICAL EXAM:  VITAL SIGNS: ED Triage Vitals  Enc Vitals Group     BP 03/26/17 1928 (!) 145/70     Pulse Rate 03/26/17 1928 70     Resp 03/26/17 1928 18     Temp 03/26/17 1928 98.1 F (36.7 C)     Temp Source 03/26/17 1928 Oral     SpO2 03/26/17 1928 100 %     Weight 03/26/17 1928 170 lb (77.1 kg)     Height 03/26/17 1928 5\' 6"  (1.676 m)     Head Circumference --      Peak Flow --      Pain Score 03/26/17 1927 10     Pain Loc --      Pain Edu? --      Excl. in [place]? --     Constitutional: Alert and oriented, patient is crying and in significant distress due to pain HEENT:      Head: Normocephalic and atraumatic.         Eyes: Conjunctivae are normal. Sclera is non-icteric. EOMI. PERRL      Mouth/Throat: Mucous membranes are moist.       Neck: Supple with no signs of meningismus. Cardiovascular: Regular rate and rhythm. No murmurs, gallops, or rubs. 2+ symmetrical distal pulses are present in all extremities. No JVD. Respiratory: Normal respiratory effort. Lungs are clear to auscultation bilaterally. No wheezes, crackles, or rhonchi.  Gastrointestinal:  Soft, diffuse tenderness to palpation throughout worse in the RLQ, and non distended with positive bowel sounds. No rebound or guarding. Genitourinary: No CVA tenderness. Musculoskeletal: Nontender with normal range of motion in all extremities. No edema, cyanosis, or erythema of extremities. Neurologic: Normal speech and language. Face is symmetric. Moving all extremities. No gross focal neurologic deficits are appreciated. Skin: Skin is warm, dry and intact. No rash noted. Psychiatric: Mood and affect are normal. Speech and behavior are normal.  ____________________________________________   LABS (all labs ordered are listed, but only abnormal results are displayed)  Labs Reviewed  COMPREHENSIVE METABOLIC PANEL - Abnormal; Notable for the following:       Result Value   Glucose, Bld 123 (*)    BUN 21 (*)    All other components within normal limits  URINALYSIS, COMPLETE (UACMP) WITH MICROSCOPIC - Abnormal; Notable for the following:    Color, Urine YELLOW (*)    APPearance HAZY (*)  Squamous Epithelial / LPF 0-5 (*)    All other components within normal limits  LIPASE, BLOOD  CBC   ____________________________________________  EKG  none ____________________________________________  RADIOLOGY  CT a/p: Distal colonic diverticulosis with question minimal wall thickening of the proximal sigmoid colon but no definite pericolic infiltrative changes to suggest acute diverticulitis.  Thickened gallbladder wall with question minimal pericholecystic fluid; unable to exclude acute cholecystitis, recommend sonographic assessment.  RUQ [place]: 1. Gallbladder wall thickening, gallstones, pericholecystic fluid and positive sonographic Murphy's sign. Findings compatible with acute cholecystitis. ____________________________________________   PROCEDURES  Procedure(s) performed: None Procedures Critical Care performed:   None ____________________________________________   INITIAL IMPRESSION / ASSESSMENT AND PLAN / ED COURSE  51 y.o. female with a history of chronic fatigue, uterine leiomyoma and ovarian cyst status post total hysterectomy and bilateral salpingo-oophorectomy in 2016, and hypothyroidism who presents for evaluation of abdominal pain. Patient is in significant distress due to the pain and crying, she has diffuse tenderness throughout worse in the right lower quadrant, normal vital signs, normal CBC, CMP, lipase, urinalysis. CT abdomen and pelvis pending. We'll give morphine, IV fluids and IV Zofran for symptom relief.  Clinical Course as of Mar 26 2254  Sun Mar 26, 2017  2254 RUQ [place] concerning for acute cholecystitis. Zosyn ordered. Will discuss with Dr. Bary Castilla, surgery for admission.  [CV]    Clinical Course User Index [CV] Rudene Re, MD    Pertinent labs & imaging results that were available during my care of the patient were reviewed by me and considered in my medical decision making (see chart for details).    ____________________________________________   FINAL CLINICAL IMPRESSION(S) / ED DIAGNOSES  Final diagnoses:  Abdominal pain  Acute cholecystitis      NEW MEDICATIONS STARTED DURING THIS VISIT:  New Prescriptions   No medications on file     Note:  This document was prepared using Dragon voice recognition software and may include unintentional dictation errors.    Rudene Re, MD 03/26/17 2255

## 2017-03-26 NOTE — ED Triage Notes (Signed)
Patient with complaint of pain that started in her right lower abdomen about a month ago. Patient states that now the pain is in her whole abdomen and the pain has become worse tonight. Patient states that she is scheduled for a ct scan tomorrow.

## 2017-03-27 ENCOUNTER — Encounter: Payer: Self-pay | Admitting: Anesthesiology

## 2017-03-27 ENCOUNTER — Encounter
Admission: EM | Disposition: A | Discharge status: Home / Self Care | Payer: Self-pay | Source: Home / Self Care | Attending: Surgery

## 2017-03-27 ENCOUNTER — Inpatient Hospital Stay: Payer: Commercial Managed Care - PPO | Admitting: Anesthesiology

## 2017-03-27 ENCOUNTER — Ambulatory Visit: Admission: RE | Admit: 2017-03-27 | Payer: Commercial Managed Care - PPO | Source: Ambulatory Visit

## 2017-03-27 HISTORY — PX: CHOLECYSTECTOMY: SHX55

## 2017-03-27 LAB — CBC
HEMATOCRIT: 38 % (ref 35.0–47.0)
Hemoglobin: 12.9 g/dL (ref 12.0–16.0)
MCH: 31.5 pg (ref 26.0–34.0)
MCHC: 33.8 g/dL (ref 32.0–36.0)
MCV: 93.2 fL (ref 80.0–100.0)
Platelets: 271 10*3/uL (ref 150–440)
RBC: 4.08 MIL/uL (ref 3.80–5.20)
RDW: 13.1 % (ref 11.5–14.5)
WBC: 8.7 10*3/uL (ref 3.6–11.0)

## 2017-03-27 LAB — COMPREHENSIVE METABOLIC PANEL
ALK PHOS: 56 U/L (ref 38–126)
ALT: 60 U/L — ABNORMAL HIGH (ref 14–54)
ANION GAP: 5 (ref 5–15)
AST: 130 U/L — AB (ref 15–41)
Albumin: 3.7 g/dL (ref 3.5–5.0)
BILIRUBIN TOTAL: 0.5 mg/dL (ref 0.3–1.2)
BUN: 13 mg/dL (ref 6–20)
CALCIUM: 8.2 mg/dL — AB (ref 8.9–10.3)
CO2: 27 mmol/L (ref 22–32)
Chloride: 106 mmol/L (ref 101–111)
Creatinine, Ser: 0.62 mg/dL (ref 0.44–1.00)
GFR calc Af Amer: >60 mL/min (ref >60)
GLUCOSE: 114 mg/dL — AB (ref 65–99)
POTASSIUM: 3.2 mmol/L — AB (ref 3.5–5.1)
Sodium: 138 mmol/L (ref 135–145)
TOTAL PROTEIN: 6.6 g/dL (ref 6.5–8.1)

## 2017-03-27 LAB — SURGICAL PCR SCREEN
MRSA, PCR: NEGATIVE
Staphylococcus aureus: NEGATIVE

## 2017-03-27 SURGERY — LAPAROSCOPIC CHOLECYSTECTOMY
Anesthesia: General | Site: Abdomen | Wound class: Clean

## 2017-03-27 MED ORDER — SUGAMMADEX SODIUM 200 MG/2ML IV SOLN
INTRAVENOUS | Status: AC
  Filled 2017-03-27: qty 2

## 2017-03-27 MED ORDER — FENTANYL CITRATE (PF) 100 MCG/2ML IJ SOLN
INTRAMUSCULAR | Status: AC
  Filled 2017-03-27: qty 2

## 2017-03-27 MED ORDER — PROPOFOL 10 MG/ML IV BOLUS
INTRAVENOUS | Status: AC
  Filled 2017-03-27: qty 20

## 2017-03-27 MED ORDER — ACETAMINOPHEN 10 MG/ML IV SOLN
INTRAVENOUS | Status: DC | PRN
  Administered 2017-03-27: 1000 mg via INTRAVENOUS

## 2017-03-27 MED ORDER — FENTANYL CITRATE (PF) 100 MCG/2ML IJ SOLN: 25.0000 ug | INTRAMUSCULAR | Status: DC | PRN

## 2017-03-27 MED ORDER — MIDAZOLAM HCL 2 MG/2ML IJ SOLN
INTRAMUSCULAR | Status: AC
  Filled 2017-03-27: qty 2

## 2017-03-27 MED ORDER — PIPERACILLIN-TAZOBACTAM 3.375 G IVPB 30 MIN
3.3750 g | Freq: Once | INTRAVENOUS | Status: AC
  Administered 2017-03-27: 3.375 g via INTRAVENOUS
  Filled 2017-03-27: qty 50

## 2017-03-27 MED ORDER — MORPHINE SULFATE (PF) 2 MG/ML IV SOLN
2.0000 mg | INTRAVENOUS | Status: DC | PRN
  Filled 2017-03-27: qty 1

## 2017-03-27 MED ORDER — PHENYLEPHRINE HCL 10 MG/ML IJ SOLN
INTRAMUSCULAR | Status: DC | PRN
  Administered 2017-03-27 (×2): 100 ug via INTRAVENOUS
  Administered 2017-03-27: 200 ug via INTRAVENOUS
  Administered 2017-03-27: 100 ug via INTRAVENOUS

## 2017-03-27 MED ORDER — ROCURONIUM BROMIDE 100 MG/10ML IV SOLN
INTRAVENOUS | Status: DC | PRN
  Administered 2017-03-27: 30 mg via INTRAVENOUS

## 2017-03-27 MED ORDER — DEXAMETHASONE SODIUM PHOSPHATE 10 MG/ML IJ SOLN
INTRAMUSCULAR | Status: DC | PRN
  Administered 2017-03-27: 10 mg via INTRAVENOUS

## 2017-03-27 MED ORDER — KETOROLAC TROMETHAMINE 30 MG/ML IJ SOLN
INTRAMUSCULAR | Status: AC
  Filled 2017-03-27: qty 1

## 2017-03-27 MED ORDER — MIDAZOLAM HCL 2 MG/2ML IJ SOLN
INTRAMUSCULAR | Status: DC | PRN
  Administered 2017-03-27: 2 mg via INTRAVENOUS

## 2017-03-27 MED ORDER — DEXAMETHASONE SODIUM PHOSPHATE 10 MG/ML IJ SOLN
INTRAMUSCULAR | Status: AC
  Filled 2017-03-27: qty 1

## 2017-03-27 MED ORDER — KETOROLAC TROMETHAMINE 30 MG/ML IJ SOLN
INTRAMUSCULAR | Status: DC | PRN
  Administered 2017-03-27: 30 mg via INTRAVENOUS

## 2017-03-27 MED ORDER — SUGAMMADEX SODIUM 200 MG/2ML IV SOLN
INTRAVENOUS | Status: DC | PRN
  Administered 2017-03-27: 160 mg via INTRAVENOUS

## 2017-03-27 MED ORDER — KETOROLAC TROMETHAMINE 15 MG/ML IJ SOLN
30.0000 mg | Freq: Four times a day (QID) | INTRAMUSCULAR | Status: DC
  Administered 2017-03-27 – 2017-03-28 (×4): 30 mg via INTRAVENOUS
  Filled 2017-03-27 (×4): qty 2

## 2017-03-27 MED ORDER — PROPOFOL 10 MG/ML IV BOLUS
INTRAVENOUS | Status: DC | PRN
  Administered 2017-03-27: 150 mg via INTRAVENOUS

## 2017-03-27 MED ORDER — OXYCODONE HCL 5 MG PO TABS
5.0000 mg | ORAL_TABLET | ORAL | Status: DC | PRN
  Administered 2017-03-27 – 2017-03-28 (×3): 5 mg via ORAL
  Filled 2017-03-27 (×3): qty 1

## 2017-03-27 MED ORDER — ONDANSETRON HCL 4 MG/2ML IJ SOLN
INTRAMUSCULAR | Status: AC
  Filled 2017-03-27: qty 2

## 2017-03-27 MED ORDER — ONDANSETRON HCL 4 MG/2ML IJ SOLN
INTRAMUSCULAR | Status: DC | PRN
  Administered 2017-03-27: 4 mg via INTRAVENOUS

## 2017-03-27 MED ORDER — ACETAMINOPHEN 10 MG/ML IV SOLN
INTRAVENOUS | Status: AC
  Filled 2017-03-27: qty 100

## 2017-03-27 MED ORDER — FENTANYL CITRATE (PF) 100 MCG/2ML IJ SOLN
INTRAMUSCULAR | Status: DC | PRN
  Administered 2017-03-27: 50 ug via INTRAVENOUS
  Administered 2017-03-27: 100 ug via INTRAVENOUS

## 2017-03-27 MED ORDER — SEVOFLURANE IN SOLN
RESPIRATORY_TRACT | Status: AC
  Filled 2017-03-27: qty 250

## 2017-03-27 MED ORDER — KCL IN DEXTROSE-NACL 20-5-0.45 MEQ/L-%-% IV SOLN
INTRAVENOUS | Status: DC
  Administered 2017-03-27 (×2): via INTRAVENOUS
  Filled 2017-03-27 (×6): qty 1000

## 2017-03-27 MED ORDER — SUCCINYLCHOLINE CHLORIDE 20 MG/ML IJ SOLN
INTRAMUSCULAR | Status: DC | PRN
  Administered 2017-03-27: 100 mg via INTRAVENOUS

## 2017-03-27 MED ORDER — PROPOFOL 500 MG/50ML IV EMUL
INTRAVENOUS | Status: AC
  Filled 2017-03-27: qty 50

## 2017-03-27 MED ORDER — BUPIVACAINE-EPINEPHRINE 0.25% -1:200000 IJ SOLN
INTRAMUSCULAR | Status: DC | PRN
  Administered 2017-03-27: 20 mL

## 2017-03-27 MED ORDER — LIDOCAINE HCL (PF) 2 % IJ SOLN
INTRAMUSCULAR | Status: AC
  Filled 2017-03-27: qty 2

## 2017-03-27 MED ORDER — SUCCINYLCHOLINE CHLORIDE 20 MG/ML IJ SOLN
INTRAMUSCULAR | Status: AC
  Filled 2017-03-27: qty 1

## 2017-03-27 MED ORDER — ROCURONIUM BROMIDE 50 MG/5ML IV SOLN
INTRAVENOUS | Status: AC
  Filled 2017-03-27: qty 1

## 2017-03-27 MED ORDER — OXYCODONE HCL 5 MG PO TABS: 5.0000 mg | ORAL_TABLET | Freq: Once | ORAL | Status: DC | PRN

## 2017-03-27 MED ORDER — LIDOCAINE HCL (CARDIAC) 20 MG/ML IV SOLN
INTRAVENOUS | Status: DC | PRN
  Administered 2017-03-27: 80 mg via INTRAVENOUS

## 2017-03-27 MED ORDER — LACTATED RINGERS IV SOLN
INTRAVENOUS | Status: DC | PRN
  Administered 2017-03-27: 09:00:00 via INTRAVENOUS

## 2017-03-27 MED ORDER — OXYCODONE HCL 5 MG/5ML PO SOLN: 5.0000 mg | Freq: Once | ORAL | Status: DC | PRN

## 2017-03-27 SURGICAL SUPPLY — 47 items
ADH SKN CLS APL DERMABOND .7 (GAUZE/BANDAGES/DRESSINGS) ×1
APPLIER CLIP 5 13 M/L LIGAMAX5 (MISCELLANEOUS) ×3
APR CLP MED LRG 5 ANG JAW (MISCELLANEOUS) ×1
BLADE SURG 15 STRL LF DISP TIS (BLADE) ×1 IMPLANT
BLADE SURG 15 STRL SS (BLADE) ×3
CANISTER SUCT 1200ML W/VALVE (MISCELLANEOUS) ×3 IMPLANT
CATH CHOLANGI 4FR 420404F (CATHETERS) IMPLANT
CHLORAPREP W/TINT 26ML (MISCELLANEOUS) ×3 IMPLANT
CLIP APPLIE 5 13 M/L LIGAMAX5 (MISCELLANEOUS) ×1 IMPLANT
CONRAY 60ML FOR OR (MISCELLANEOUS) ×3 IMPLANT
DERMABOND ADVANCED (GAUZE/BANDAGES/DRESSINGS) ×2
DERMABOND ADVANCED .7 DNX12 (GAUZE/BANDAGES/DRESSINGS) ×1 IMPLANT
DRAPE C-ARM XRAY 36X54 (DRAPES) IMPLANT
ELECT REM PT RETURN 9FT ADLT (ELECTROSURGICAL) ×3
ELECTRODE REM PT RTRN 9FT ADLT (ELECTROSURGICAL) ×1 IMPLANT
ENDOPOUCH RETRIEVER 10 (MISCELLANEOUS) ×3 IMPLANT
FILTER LAP SMOKE EVAC STRL (MISCELLANEOUS) ×3 IMPLANT
GLOVE SURG SYN 7.0 (GLOVE) ×3 IMPLANT
GLOVE SURG SYN 7.0 PF PI (GLOVE) ×1 IMPLANT
GLOVE SURG SYN 7.5  E (GLOVE) ×2
GLOVE SURG SYN 7.5 E (GLOVE) ×1 IMPLANT
GLOVE SURG SYN 7.5 PF PI (GLOVE) ×1 IMPLANT
GOWN STRL REUS W/ TWL LRG LVL3 (GOWN DISPOSABLE) ×3 IMPLANT
GOWN STRL REUS W/TWL LRG LVL3 (GOWN DISPOSABLE) ×9
IRRIGATION STRYKERFLOW (MISCELLANEOUS) IMPLANT
IRRIGATOR STRYKERFLOW (MISCELLANEOUS)
IV CATH ANGIO 12GX3 LT BLUE (NEEDLE) ×3 IMPLANT
IV NS 1000ML (IV SOLUTION)
IV NS 1000ML BAXH (IV SOLUTION) IMPLANT
JACKSON PRATT 10 (INSTRUMENTS) IMPLANT
L-HOOK LAP DISP 36CM (ELECTROSURGICAL) ×3
LABEL OR SOLS (LABEL) ×3 IMPLANT
LHOOK LAP DISP 36CM (ELECTROSURGICAL) ×1 IMPLANT
NDL SAFETY 22GX1.5 (NEEDLE) ×3 IMPLANT
NEEDLE HYPO 22GX1.5 SAFETY (NEEDLE) ×3 IMPLANT
PACK LAP CHOLECYSTECTOMY (MISCELLANEOUS) ×3 IMPLANT
PENCIL ELECTRO HAND CTR (MISCELLANEOUS) ×3 IMPLANT
SCISSORS METZENBAUM CVD 33 (INSTRUMENTS) ×3 IMPLANT
SLEEVE ADV FIXATION 5X100MM (TROCAR) ×9 IMPLANT
SPONGE VERSALON 4X4 4PLY (MISCELLANEOUS) IMPLANT
SUT MNCRL 4-0 (SUTURE) ×6
SUT MNCRL 4-0 27XMFL (SUTURE) ×2
SUT VICRYL 0 AB UR-6 (SUTURE) ×5 IMPLANT
SUTURE MNCRL 4-0 27XMF (SUTURE) ×1 IMPLANT
TROCAR 130MM GELPORT  DAV (MISCELLANEOUS) ×3 IMPLANT
TROCAR Z-THREAD OPTICAL 5X100M (TROCAR) ×3 IMPLANT
TUBING INSUFFLATOR HI FLOW (MISCELLANEOUS) ×3 IMPLANT

## 2017-03-27 NOTE — Anesthesia Postprocedure Evaluation (Signed)
Anesthesia Post Note  Patient: KAILANI BRASS  Procedure(s) Performed: Procedure(s) (LRB): LAPAROSCOPIC CHOLECYSTECTOMY (N/A)  Patient location during evaluation: PACU Anesthesia Type: General Level of consciousness: awake and alert Pain management: pain level controlled Vital Signs Assessment: post-procedure vital signs reviewed and stable Respiratory status: spontaneous breathing, nonlabored ventilation, respiratory function stable and patient connected to nasal cannula oxygen Cardiovascular status: blood pressure returned to baseline and stable Postop Assessment: no signs of nausea or vomiting Anesthetic complications: no     Last Vitals:  Vitals:   03/27/17 1256 03/27/17 1300  BP: 132/72 134/70  Pulse: 62 64  Resp: 18 18  Temp: 36.4 C 36.6 C    Last Pain:  Vitals:   03/27/17 1304  TempSrc:   PainSc: 8                  Precious Haws Rafael Quesada

## 2017-03-27 NOTE — Anesthesia Preprocedure Evaluation (Signed)
Anesthesia Evaluation  Patient identified by MRN, date of birth, ID band Patient awake    Reviewed: Allergy & Precautions, H&P , NPO status , Patient's Chart, lab work & pertinent test results  History of Anesthesia Complications Negative for: history of anesthetic complications  Airway Mallampati: III  TM Distance: <3 FB Neck ROM: limited    Dental  (+) Poor Dentition, Caps, Chipped   Pulmonary neg pulmonary ROS, neg shortness of breath,    Pulmonary exam normal breath sounds clear to auscultation       Cardiovascular Exercise Tolerance: Good (-) angina(-) Past MI and (-) DOE negative cardio ROS Normal cardiovascular exam Rhythm:regular Rate:Normal     Neuro/Psych  Neuromuscular disease negative psych ROS   GI/Hepatic negative GI ROS, Neg liver ROS, neg GERD  ,  Endo/Other  Hypothyroidism   Renal/GU      Musculoskeletal   Abdominal   Peds  Hematology negative hematology ROS (+)   Anesthesia Other Findings Signs and symptoms suggestive of sleep apnea   Past Medical History: No date: Chronic fatigue No date: Hyperlipidemia No date: Hypothyroidism No date: Overweight No date: Thyroid disease  Past Surgical History: 10/05/2015: ABDOMINAL HYSTERECTOMY N/A     Comment: Procedure: HYSTERECTOMY ABDOMINAL;  Surgeon:               [name], MD;  Location: [facility];               Service: Gynecology;  Laterality: N/A; 11/22/2016: COLONOSCOPY WITH PROPOFOL N/A     Comment: Procedure: COLONOSCOPY WITH PROPOFOL;                Surgeon: Robert Bellow, MD;  Location: [facility]              ENDOSCOPY;  Service: Endoscopy;  Laterality:               N/A; No date: FRACTURE SURGERY 08/24/2015: LAPAROSCOPY N/A     Comment: Procedure: LAPAROSCOPY DIAGNOSTIC;  Surgeon:               [name], MD;  Location: [facility] ORS;               Service: Gynecology;  Laterality: N/A; [id number]: LEEP     Comment:  [facility] 1996: MANDIBLE FRACTURE SURGERY 10/05/2015: SALPINGOOPHORECTOMY Bilateral     Comment: Procedure: SALPINGO OOPHORECTOMY;  Surgeon:               [name], MD;  Location: [facility] ORS;               Service: Gynecology;  Laterality: Bilateral;  BMI    Body Mass Index:  27.44 kg/m      Reproductive/Obstetrics negative OB ROS                             Anesthesia Physical Anesthesia Plan  ASA: III  Anesthesia Plan: General ETT   Post-op Pain Management:    Induction:   Airway Management Planned:   Additional Equipment:   Intra-op Plan:   Post-operative Plan:   Informed Consent: I have reviewed the patients History and Physical, chart, labs and discussed the procedure including the risks, benefits and alternatives for the proposed anesthesia with the patient or authorized representative who has indicated his/her understanding and acceptance.   Dental Advisory Given  Plan Discussed with: Anesthesiologist, CRNA and Surgeon  Anesthesia Plan Comments:  Anesthesia Quick Evaluation  

## 2017-03-27 NOTE — Interval H&P Note (Signed)
History and Physical Interval Note:  03/27/2017 9:07 AM  Monica Hardin  has presented today for surgery, with the diagnosis of n/a  The various methods of treatment have been discussed with the patient and family. After consideration of risks, benefits and other options for treatment, the patient has consented to  Procedure(s): LAPAROSCOPIC CHOLECYSTECTOMY (N/A) as a surgical intervention .  The patient's history has been reviewed, patient examined, no change in status, stable for surgery.  I have reviewed the patient's chart and labs.  Questions were answered to the patient's satisfaction.     Lateshia Schmoker

## 2017-03-27 NOTE — Anesthesia Post-op Follow-up Note (Cosign Needed)
Anesthesia QCDR form completed.        

## 2017-03-27 NOTE — Op Note (Signed)
  Procedure Date:  03/27/2017  Pre-operative Diagnosis:  Acute cholecystitis  Post-operative Diagnosis: Acute cholecystitis  Procedure:  Laparoscopic cholecystectomy  Surgeon:  Melvyn Neth, MD  Anesthesia:  General endotracheal  Estimated Blood Loss:  5 ml  Specimens:  gallbladder  Complications:  None  Indications for Procedure:  This is a 51 y.o. female who presents with abdominal pain and workup revealing acute cholecystitis.  The benefits, complications, treatment options, and expected outcomes were discussed with the patient. The risks of bleeding, infection, recurrence of symptoms, failure to resolve symptoms, bile duct damage, bile duct leak, retained common bile duct stone, bowel injury, and need for further procedures were all discussed with the patient and she was willing to proceed.  Description of Procedure: The patient was correctly identified in the preoperative area and brought into the operating room.  The patient was placed supine with VTE prophylaxis in place.  Appropriate time-outs were performed.  Anesthesia was induced and the patient was intubated.  Appropriate antibiotics were infused.  The abdomen was prepped and draped in a sterile fashion. An infraumbilical incision was made. A cutdown technique was used to enter the abdominal cavity without injury, and a Hasson trocar was inserted.  Pneumoperitoneum was obtained with appropriate opening pressures.  A 5-mm port was placed in the subxiphoid area and two 5-mm ports were placed in the right upper quadrant under direct visualization.  The gallbladder was identified and found to be distended and inflammed.  The fundus was grasped and retracted cephalad.  Adhesions were lysed bluntly and with electrocautery. The infundibulum was grasped and retracted laterally, exposing the peritoneum overlying the gallbladder.  This was incised with electrocautery and extended on either side of the gallbladder.  The cystic duct and  cystic artery were clearly identified and bluntly dissected.  Both were clipped twice proximally and once distally, cutting in between.  The gallbladder was taken from the gallbladder fossa in a retrograde fashion with electrocautery.  There was mild spillage of gallbladder contents while dissecting the gallbladder.  The gallbladder was placed in an Endocatch bag and brought out via the umbilical incision. The right upper quadrant was thoroughly irrigated.  The liver bed was inspected and any bleeding was controlled with electrocautery. The right upper quadrant was then inspected again revealing intact clips, no bleeding, and no ductal injury.  The 5 mm ports were removed under direct visualization and the Hasson trocar was removed.  The fascial opening was closed using 0 vicryl sutures.  Local anesthetic was infused in all incisions and the incisions were closed with 4-0 Monocryl.  The wounds were cleaned and sealed with DermaBond.  The patient was emerged from anesthesia and extubated and brought to the recovery room for further management.  The patient tolerated the procedure well and all counts were correct at the end of the case.   Melvyn Neth, MD

## 2017-03-27 NOTE — Transfer of Care (Signed)
Immediate Anesthesia Transfer of Care Note  Patient: Monica Hardin  Procedure(s) Performed: Procedure(s): LAPAROSCOPIC CHOLECYSTECTOMY (N/A)  Patient Location: PACU  Anesthesia Type:General  Level of Consciousness: sedated  Airway & Oxygen Therapy: Patient Spontanous Breathing and Patient connected to face mask oxygen  Post-op Assessment: Report given to RN and Post -op Vital signs reviewed and stable  Post vital signs: Reviewed and stable  Last Vitals:  Vitals:   03/27/17 0839 03/27/17 1113  BP: (!) 116/56 (!) 116/59  Pulse: 88 91  Resp: 18 10  Temp:  36.2 C    Last Pain:  Vitals:   03/27/17 1113  TempSrc: Temporal  PainSc:       Patients Stated Pain Goal: 0 (88/87/57 9728)  Complications: No apparent anesthesia complications

## 2017-03-27 NOTE — Anesthesia Procedure Notes (Signed)
Procedure Name: Intubation Date/Time: 03/27/2017 9:27 AM Performed by: Johnna Acosta Pre-anesthesia Checklist: Patient identified, Emergency Drugs available, Suction available, Patient being monitored and Timeout performed Patient Re-evaluated:Patient Re-evaluated prior to inductionOxygen Delivery Method: Circle system utilized Preoxygenation: Pre-oxygenation with 100% oxygen Intubation Type: IV induction Ventilation: Mask ventilation without difficulty Laryngoscope Size: McGraph and 3 Grade View: Grade I Tube size: 7.0 mm Number of attempts: 1 Airway Equipment and Method: Stylet and Video-laryngoscopy Placement Confirmation: ETT inserted through vocal cords under direct vision,  positive ETCO2 and breath sounds checked- equal and bilateral Secured at: 21 cm Tube secured with: Tape Dental Injury: Teeth and Oropharynx as per pre-operative assessment  Difficulty Due To: Difficulty was anticipated and Difficult Airway- due to limited oral opening

## 2017-03-28 ENCOUNTER — Encounter: Payer: Self-pay | Admitting: Surgery

## 2017-03-28 LAB — HIV ANTIBODY (ROUTINE TESTING W REFLEX): HIV Screen 4th Generation wRfx: NONREACTIVE

## 2017-03-28 LAB — SURGICAL PATHOLOGY

## 2017-03-28 MED ORDER — OXYCODONE HCL 5 MG PO TABS: 5.0000 mg | ORAL_TABLET | ORAL | 0 refills | Status: DC | PRN

## 2017-03-28 NOTE — Progress Notes (Signed)
Patient ID: Monica Hardin, female   DOB: [birth year], 51 y.o.   MRN: [id number] No complaints. Tolerating diet. AVSS. Abdomen-port sites are clean, soft, good   bs. OK to discharge

## 2017-03-28 NOTE — Progress Notes (Signed)
Pt d/c to home today.  IV removed intact.  Rx's given to pt w/all questions and concerns addressed.  D/C paperwork reviewed and education provided with all questions and concerns addressed.  Pt husband at bedside for home transport.  

## 2017-04-04 ENCOUNTER — Ambulatory Visit (INDEPENDENT_AMBULATORY_CARE_PROVIDER_SITE_OTHER): Payer: Commercial Managed Care - PPO | Admitting: Surgery

## 2017-04-04 ENCOUNTER — Encounter: Payer: Self-pay | Admitting: Surgery

## 2017-04-04 VITALS — BP 137/69 | HR 78 | Temp 96.1°F | Ht 66.0 in | Wt 168.0 lb

## 2017-04-04 DIAGNOSIS — Z09 Encounter for follow-up examination after completed treatment for conditions other than malignant neoplasm: Secondary | ICD-10-CM

## 2017-04-04 NOTE — Patient Instructions (Signed)
Please call our office if you have questions or concerns.   

## 2017-04-04 NOTE — Progress Notes (Signed)
s/p lap chole by Dr. Hampton Abbot 4/2 Doing well No complaints Taking PO, No fevers Path d/w pt  PE NAD Abd: soft, NT, no peritonitis, no infection. Incisions c/d/I  A/P Doing well No heavy lifting RTC prn

## 2017-04-19 ENCOUNTER — Ambulatory Visit: Payer: Managed Care, Other (non HMO) | Admitting: Family Medicine

## 2017-04-25 NOTE — Discharge Summary (Signed)
Patient ID: Monica Hardin MRN: [id number] DOB/AGE: 52-Jul-1967 52 y.o.  Admit date: 03/26/2017 Discharge date: 03/28/17   Discharge Diagnoses:  Active Problems:   Acute cholecystitis   Procedures: laparoscopic cholecystectomy  Hospital Course:  Patient was admitted on 4/1 with acute cholecystitis.  Taken to OR on 4/2 with no complications.  Diet advanced, pain tolerated well.  Discharged to home on 4/3  Consults: None  Disposition: 01-Home or Self Care  Discharge Instructions    Call MD for:  persistant nausea and vomiting    Complete by:  As directed    Call MD for:  redness, tenderness, or signs of infection (pain, swelling, redness, odor or green/yellow discharge around incision site)    Complete by:  As directed    Call MD for:  severe uncontrolled pain    Complete by:  As directed    Call MD for:  temperature >100.4    Complete by:  As directed    Diet - low sodium heart healthy    Complete by:  As directed    Discharge instructions    Complete by:  As directed    May shower. No exertional activity May return to work on 04/03/17   Increase activity slowly    Complete by:  As directed      Allergies as of 03/28/2017   No Known Allergies     Medication List    TAKE these medications   estradiol 1 MG tablet Commonly known as:  ESTRACE Take 1 tablet (1 mg total) by mouth daily. Notes to patient:  Not given today.    levothyroxine 100 MCG tablet Commonly known as:  SYNTHROID, LEVOTHROID TAKE 1 TABLET BY MOUTH DAILY BEFORE BREAKFAST. TAKE 1 AND 1/2 TABLETSON SUNDAYS.   Vitamin B-12 1000 MCG Subl Place 1 tablet (1,000 mcg total) under the tongue 3 (three) times a week. Notes to patient:  Not given today.      Follow-up Information    Jules Husbands, MD. Go on 04/04/2017.   Specialty:  General Surgery Why:  Wednesday at 11:15am for hospital follow-up Contact information: [address] [place] [address] [phone]

## 2017-05-01 ENCOUNTER — Other Ambulatory Visit: Payer: Self-pay

## 2017-05-01 DIAGNOSIS — R232 Flushing: Secondary | ICD-10-CM

## 2017-05-01 MED ORDER — ESTRADIOL 1 MG PO TABS: 1.0000 mg | ORAL_TABLET | Freq: Every day | ORAL | 1 refills | Status: DC

## 2017-09-05 ENCOUNTER — Other Ambulatory Visit: Payer: Self-pay | Admitting: Family Medicine

## 2017-09-05 NOTE — Telephone Encounter (Signed)
Patient requesting refill of Levothyroxine to [facility].

## 2017-09-21 ENCOUNTER — Ambulatory Visit: Payer: Commercial Managed Care - PPO | Admitting: Family Medicine

## 2017-10-03 ENCOUNTER — Ambulatory Visit: Payer: Commercial Managed Care - PPO | Admitting: Family Medicine

## 2017-10-30 ENCOUNTER — Other Ambulatory Visit: Payer: Self-pay | Admitting: Family Medicine

## 2017-10-30 DIAGNOSIS — Z1231 Encounter for screening mammogram for malignant neoplasm of breast: Secondary | ICD-10-CM

## 2017-10-31 ENCOUNTER — Encounter: Payer: Self-pay | Admitting: Family Medicine

## 2017-10-31 ENCOUNTER — Ambulatory Visit: Payer: Commercial Managed Care - PPO | Admitting: Family Medicine

## 2017-10-31 VITALS — BP 100/70 | HR 70 | Resp 14 | Ht 66.0 in | Wt 165.9 lb

## 2017-10-31 DIAGNOSIS — E559 Vitamin D deficiency, unspecified: Secondary | ICD-10-CM

## 2017-10-31 DIAGNOSIS — Z79899 Other long term (current) drug therapy: Secondary | ICD-10-CM

## 2017-10-31 DIAGNOSIS — R5382 Chronic fatigue, unspecified: Secondary | ICD-10-CM

## 2017-10-31 DIAGNOSIS — G9332 Myalgic encephalomyelitis/chronic fatigue syndrome: Secondary | ICD-10-CM

## 2017-10-31 DIAGNOSIS — E785 Hyperlipidemia, unspecified: Secondary | ICD-10-CM

## 2017-10-31 DIAGNOSIS — E038 Other specified hypothyroidism: Secondary | ICD-10-CM

## 2017-10-31 DIAGNOSIS — E538 Deficiency of other specified B group vitamins: Secondary | ICD-10-CM

## 2017-10-31 DIAGNOSIS — J302 Other seasonal allergic rhinitis: Secondary | ICD-10-CM

## 2017-10-31 DIAGNOSIS — D8989 Other specified disorders involving the immune mechanism, not elsewhere classified: Secondary | ICD-10-CM

## 2017-10-31 DIAGNOSIS — R7303 Prediabetes: Secondary | ICD-10-CM | POA: Diagnosis not present

## 2017-10-31 MED ORDER — FLUTICASONE PROPIONATE 50 MCG/ACT NA SUSP: 2.0000 | Freq: Every day | NASAL | 5 refills | Status: DC

## 2017-10-31 MED ORDER — LEVOCETIRIZINE DIHYDROCHLORIDE 5 MG PO TABS: 5.0000 mg | ORAL_TABLET | Freq: Every evening | ORAL | 5 refills | Status: DC

## 2017-10-31 NOTE — Progress Notes (Signed)
Name: Monica Hardin   MRN: [id number]    DOB: 1966-10-29   Date:10/31/2017       Progress Note  Subjective  Chief Complaint  Chief Complaint  Patient presents with  . Hypothyroidism    HPI  Hypothyroidism: she has been taking 100 mcg synthroid daily  , no worsening  dry skin, she has been feeling a little bit more tired than usual. No change in bowel movements of palpitations.  Pre-diabetes: last hgbA1C was 5.3% with change of life style, but she has gained weight since,  no polyphagia, polydipsia or polyuria  She has been eating healthier and has lost a few pounds since last visit  Dyslipidemia; not on medication, discussed eating fish twice a week and tree nuts every other day, last LDL was 140 , she has changed her diet.   Chronic fatigue: she is able to work a full time job, but has difficulty maintaining her house, she goes to bed around 8: 30. Denies lymphadenopathy or fever   Patient Active Problem List   Diagnosis Date Noted  . Encounter for screening colonoscopy 11/07/2016  . Surgical menopause on hormone replacement therapy 09/01/2016  . Vitamin D deficiency 04/20/2016  . Low vitamin B12 level 04/20/2016  . History of uterine leiomyoma 04/19/2016  . Sciatica, right side 04/19/2016  . Status post TAH-BSO 11/12/2015  . History of ovarian cyst 11/12/2015  . Dyslipidemia 07/04/2015  . Adult hypothyroidism 07/04/2015  . Excess weight 07/04/2015  . Disturbance of skin sensation 07/04/2015  . Lipoma of arm 12/03/2014  . CFIDS (chronic fatigue and immune dysfunction syndrome) ([place]) 05/08/2007    Past Surgical History:  Procedure Laterality Date  . FRACTURE SURGERY    . LEEP  [id number]   [facility]  . MANDIBLE FRACTURE SURGERY  1996    Family History  Problem Relation Age of Onset  . Lung cancer Mother   . Lung cancer Father   . Kidney Stones Daughter   . Asthma Son   . Breast cancer Paternal Aunt 80    Social History   Socioeconomic History  . Marital  status: Married    Spouse name: Not on file  . Number of children: Not on file  . Years of education: Not on file  . Highest education level: Not on file  Social Needs  . Financial resource strain: Not on file  . Food insecurity - worry: Not on file  . Food insecurity - inability: Not on file  . Transportation needs - medical: Not on file  . Transportation needs - non-medical: Not on file  Occupational History  . Occupation: Office manager: [employer]  Tobacco Use  . Smoking status: Never Smoker  . Smokeless tobacco: Never Used  Substance and Sexual Activity  . Alcohol use: No    Alcohol/week: 0.0 oz  . Drug use: No  . Sexual activity: Yes    Partners: Male    Comment: Hysterectomy  Other Topics Concern  . Not on file  Social History Narrative   Two grown children, married   Works at [employer]      Current Outpatient Medications:  .  Cyanocobalamin (VITAMIN B-12) 1000 MCG SUBL, Place 1 tablet (1,000 mcg total) under the tongue 3 (three) times a week., Disp: 30 tablet, Rfl: 0 .  estradiol (ESTRACE) 1 MG tablet, Take 1 tablet (1 mg total) by mouth daily., Disp: 90 tablet, Rfl: 1 .  levothyroxine (SYNTHROID, LEVOTHROID) 100 MCG tablet, TAKE 1 TABLET BY  MOUTH BFORE BREAKFAST TAKE 1 1/2 TABLETS ON SUNDAYS, Disp: 100 tablet, Rfl: 0 .  fluticasone (FLONASE) 50 MCG/ACT nasal spray, Place 2 sprays daily into both nostrils., Disp: 16 g, Rfl: 5 .  levocetirizine (XYZAL) 5 MG tablet, Take 1 tablet (5 mg total) every evening by mouth., Disp: 30 tablet, Rfl: 5  No Known Allergies   ROS  Constitutional: Negative for fever or weight change.  Respiratory: Negative for cough and shortness of breath.   Cardiovascular: Negative for chest pain or palpitations.  Gastrointestinal: Negative for abdominal pain, no bowel changes.  Musculoskeletal: Negative for gait problem or joint swelling.  Skin: Negative for rash.  Neurological: Negative for dizziness or headache.  No other specific  complaints in a complete review of systems (except as listed in HPI above).  Objective  Vitals:   10/31/17 0749  BP: 100/70  Pulse: 70  Resp: 14  SpO2: 98%  Weight: 165 lb 14.4 oz (75.3 kg)  Height: 5\' 6"  (1.676 m)    Body mass index is 26.78 kg/m.  Physical Exam   Constitutional: Patient appears well-developed and well-nourished. Obese No distress.  HEENT: head atraumatic, normocephalic, pupils equal and reactive to light, neck supple, throat within normal limits Cardiovascular: Normal rate, regular rhythm and normal heart sounds.  No murmur heard. No BLE edema. Pulmonary/Chest: Effort normal and breath sounds normal. No respiratory distress. Abdominal: Soft.  There is no tenderness. Psychiatric: Patient has a normal mood and affect. behavior is normal. Judgment and thought content normal.  PHQ2/9: Depression screen [facility] 2/9 03/21/2017 10/19/2016 04/19/2016 07/07/2015  Decreased Interest 0 0 0 0  Down, Depressed, Hopeless 0 0 0 0  PHQ - 2 Score 0 0 0 0     Fall Risk: Fall Risk  10/31/2017 03/21/2017 10/19/2016 04/19/2016 07/07/2015  Falls in the past year? No No No No No    Assessment & Plan  1. Prediabetes  - Hemoglobin A1c - Insulin, random  2. CFIDS (chronic fatigue and immune dysfunction syndrome) ([facility])  stable  3. Dyslipidemia  - Lipid panel  4. Other specified hypothyroidism  - TSH  5. Vitamin D deficiency  - VITAMIN D 25 Hydroxy (Vit-D Deficiency, Fractures)  6. Low vitamin B12 level  - Vitamin B12  7. Long-term use of high-risk medication  - CBC with Differential/Platelet - Comprehensive metabolic panel  8. Seasonal allergic rhinitis, unspecified trigger  - fluticasone (FLONASE) 50 MCG/ACT nasal spray; Place 2 sprays daily into both nostrils.  Dispense: 16 g; Refill: 5 - levocetirizine (XYZAL) 5 MG tablet; Take 1 tablet (5 mg total) every evening by mouth.  Dispense: 30 tablet; Refill: 5

## 2017-11-01 ENCOUNTER — Other Ambulatory Visit: Payer: Self-pay | Admitting: Family Medicine

## 2017-11-01 ENCOUNTER — Encounter: Payer: Self-pay | Admitting: Family Medicine

## 2017-11-01 LAB — INSULIN, RANDOM: INSULIN: 5.7 u[IU]/mL (ref 2.6–24.9)

## 2017-11-01 LAB — COMPREHENSIVE METABOLIC PANEL
ALT: 16 IU/L (ref 0–32)
AST: 18 IU/L (ref 0–40)
Albumin/Globulin Ratio: 1.8 (ref 1.2–2.2)
Albumin: 4.8 g/dL (ref 3.5–5.5)
Alkaline Phosphatase: 68 IU/L (ref 39–117)
BILIRUBIN TOTAL: 0.3 mg/dL (ref 0.0–1.2)
BUN/Creatinine Ratio: 16 (ref 9–23)
BUN: 14 mg/dL (ref 6–24)
CALCIUM: 9.6 mg/dL (ref 8.7–10.2)
CHLORIDE: 102 mmol/L (ref 96–106)
CO2: 26 mmol/L (ref 20–29)
Creatinine, Ser: 0.86 mg/dL (ref 0.57–1.00)
GFR calc non Af Amer: 78 mL/min/{1.73_m2} (ref >59)
GFR, EST AFRICAN AMERICAN: 90 mL/min/{1.73_m2} (ref >59)
GLUCOSE: 88 mg/dL (ref 65–99)
Globulin, Total: 2.6 g/dL (ref 1.5–4.5)
Potassium: 4.4 mmol/L (ref 3.5–5.2)
Sodium: 142 mmol/L (ref 134–144)
TOTAL PROTEIN: 7.4 g/dL (ref 6.0–8.5)

## 2017-11-01 LAB — CBC WITH DIFFERENTIAL/PLATELET
BASOS ABS: 0 10*3/uL (ref 0.0–0.2)
Basos: 0 %
EOS (ABSOLUTE): 0.1 10*3/uL (ref 0.0–0.4)
Eos: 2 %
Hematocrit: 42 % (ref 34.0–46.6)
Hemoglobin: 13.9 g/dL (ref 11.1–15.9)
IMMATURE GRANS (ABS): 0 10*3/uL (ref 0.0–0.1)
IMMATURE GRANULOCYTES: 0 %
LYMPHS: 40 %
Lymphocytes Absolute: 2.4 10*3/uL (ref 0.7–3.1)
MCH: 31 pg (ref 26.6–33.0)
MCHC: 33.1 g/dL (ref 31.5–35.7)
MCV: 94 fL (ref 79–97)
Monocytes Absolute: 0.3 10*3/uL (ref 0.1–0.9)
Monocytes: 5 %
NEUTROS PCT: 53 %
Neutrophils Absolute: 3.1 10*3/uL (ref 1.4–7.0)
PLATELETS: 338 10*3/uL (ref 150–379)
RBC: 4.48 x10E6/uL (ref 3.77–5.28)
RDW: 12.7 % (ref 12.3–15.4)
WBC: 5.9 10*3/uL (ref 3.4–10.8)

## 2017-11-01 LAB — TSH: TSH: 2.18 u[IU]/mL (ref 0.450–4.500)

## 2017-11-01 LAB — HEMOGLOBIN A1C
ESTIMATED AVERAGE GLUCOSE: 108 mg/dL
HEMOGLOBIN A1C: 5.4 % (ref 4.8–5.6)

## 2017-11-01 LAB — LIPID PANEL
Chol/HDL Ratio: 3.6 ratio (ref 0.0–4.4)
Cholesterol, Total: 215 mg/dL — ABNORMAL HIGH (ref 100–199)
HDL: 59 mg/dL (ref >39)
LDL Calculated: 131 mg/dL — ABNORMAL HIGH (ref 0–99)
Triglycerides: 123 mg/dL (ref 0–149)
VLDL Cholesterol Cal: 25 mg/dL (ref 5–40)

## 2017-11-01 LAB — SPECIMEN STATUS REPORT

## 2017-11-01 LAB — VITAMIN B12: Vitamin B-12: 602 pg/mL (ref 232–1245)

## 2017-11-01 LAB — VITAMIN D 25 HYDROXY (VIT D DEFICIENCY, FRACTURES): VIT D 25 HYDROXY: 26.7 ng/mL — AB (ref 30.0–100.0)

## 2017-11-02 ENCOUNTER — Other Ambulatory Visit: Payer: Self-pay | Admitting: Family Medicine

## 2017-11-02 MED ORDER — VITAMIN D (ERGOCALCIFEROL) 1.25 MG (50000 UNIT) PO CAPS: 50000.0000 [IU] | ORAL_CAPSULE | ORAL | 0 refills | Status: DC

## 2017-12-25 ENCOUNTER — Ambulatory Visit
Admission: RE | Admit: 2017-12-25 | Discharge: 2017-12-25 | Disposition: A | Discharge status: Home / Self Care | Payer: Commercial Managed Care - PPO | Source: Ambulatory Visit | Attending: Family Medicine | Admitting: Family Medicine

## 2017-12-25 DIAGNOSIS — Z1231 Encounter for screening mammogram for malignant neoplasm of breast: Secondary | ICD-10-CM | POA: Insufficient documentation

## 2017-12-28 ENCOUNTER — Encounter: Payer: Self-pay | Admitting: Family Medicine

## 2018-01-08 ENCOUNTER — Other Ambulatory Visit: Payer: Self-pay | Admitting: Family Medicine

## 2018-01-08 ENCOUNTER — Other Ambulatory Visit: Payer: Self-pay | Admitting: Obstetrics and Gynecology

## 2018-01-08 DIAGNOSIS — R232 Flushing: Secondary | ICD-10-CM

## 2018-01-08 NOTE — Telephone Encounter (Signed)
Refill request for thyroid medication. Levothyroxine to [facility].   Last Visit: 10/31/2017   Lab Results  Component Value Date   TSH 2.180 10/31/2017     Follow up on 05/01/2018

## 2018-02-13 ENCOUNTER — Encounter: Payer: Self-pay | Admitting: Family Medicine

## 2018-03-31 ENCOUNTER — Other Ambulatory Visit: Payer: Self-pay | Admitting: Family Medicine

## 2018-04-16 IMAGING — CT CT ABD-PELV W/ CM
2 of 5 series · 16 of 46 positions shown, 18 images · IV contrast (APPLIED)
Comparison: None

CLINICAL DATA: Onset of RIGHT lower abdomen pain 1 month ago, pain
non diffuse throughout whole abdomen and worsened tonight

EXAM:
CT ABDOMEN AND PELVIS WITH CONTRAST
TECHNIQUE: Multidetector CT imaging of the abdomen and pelvis was performed
using the standard protocol following bolus administration of
intravenous contrast. Sagittal and coronal MPR images reconstructed
from axial data set.
CONTRAST:  100mL 77DM9T-TNN IOPAMIDOL (77DM9T-TNN) INJECTION 61% IV.
No oral contrast administered.

[Series 2: routine abd/pel with · axial · 0.72mm/px · z∈[-624,-214]mm · 13 of 92 slices shown, 15 images]
[im 5/92  soft-tissue]
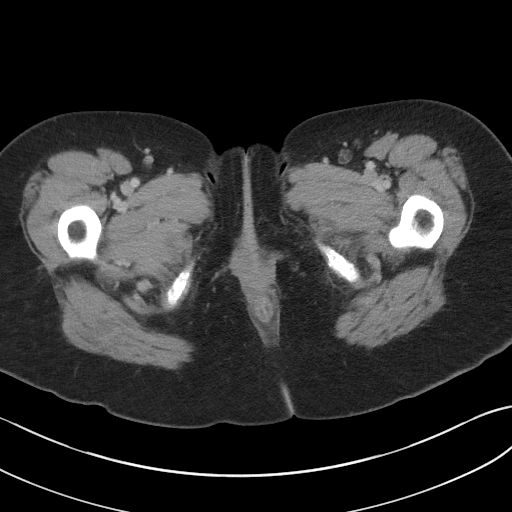
[im 5/92  bone]
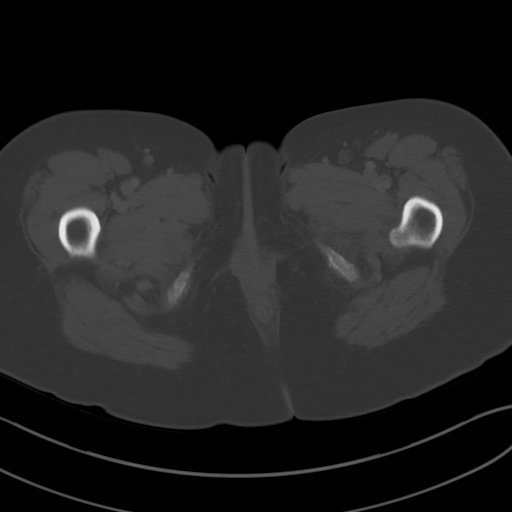
[im 14/92  soft-tissue]
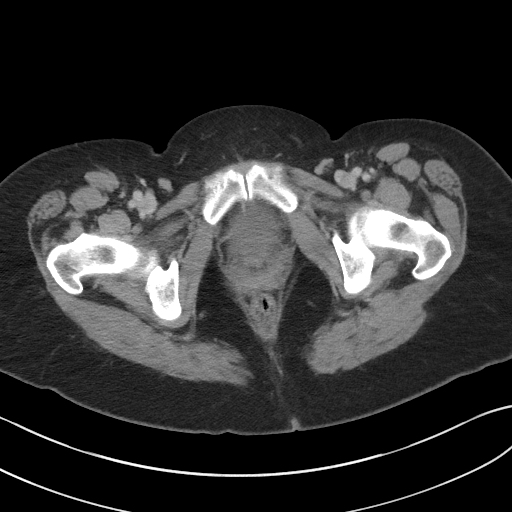
[im 19/92  soft-tissue]
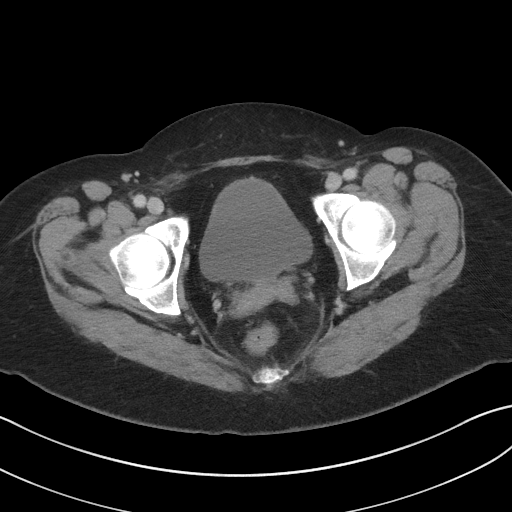
[im 28/92  soft-tissue]
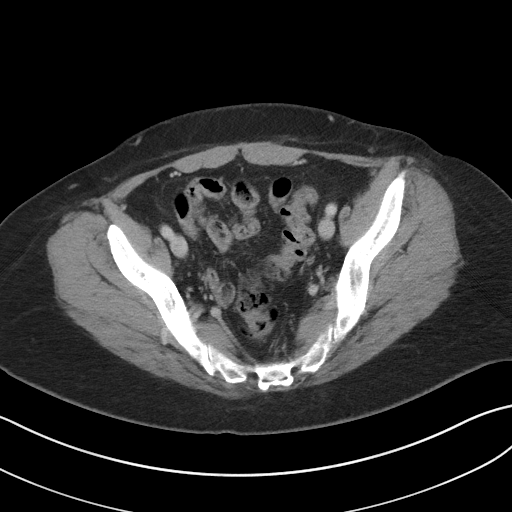
[im 32/92  soft-tissue]
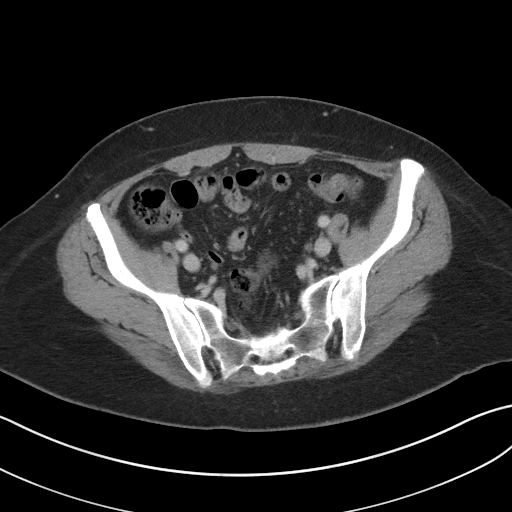
[im 41/92  soft-tissue]
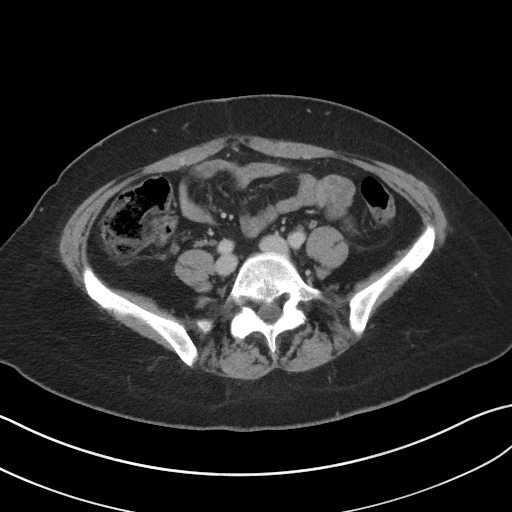
[im 46/92  soft-tissue]
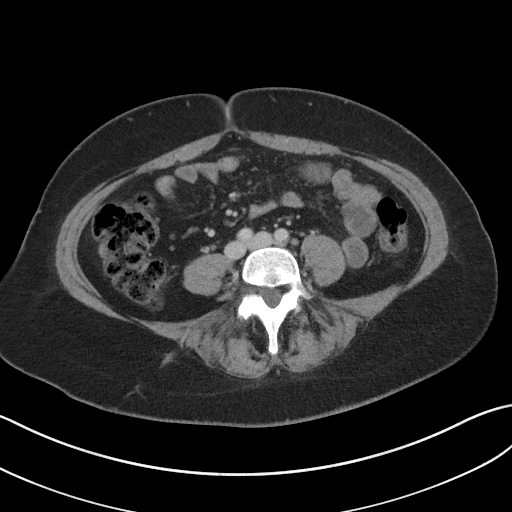
[im 51/92  soft-tissue]
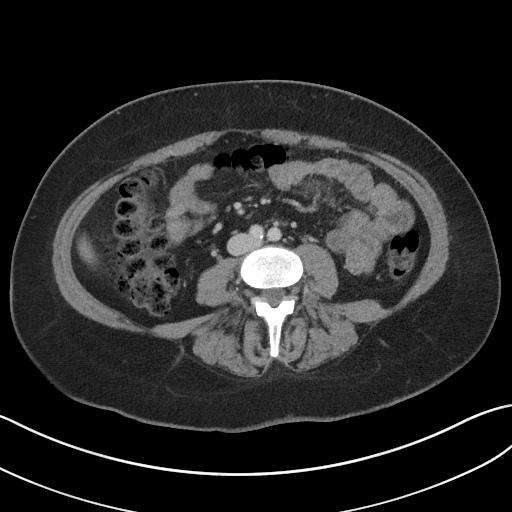
[im 60/92  soft-tissue]
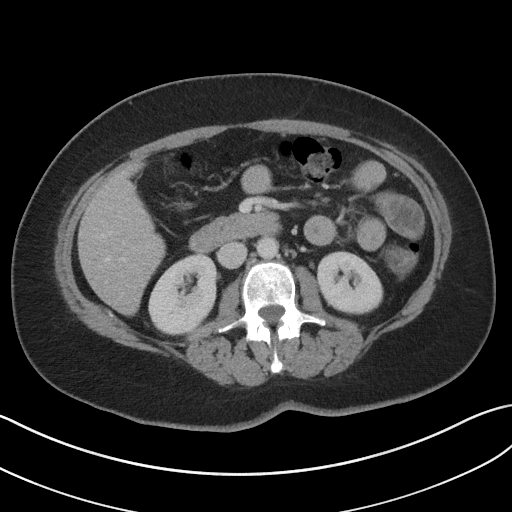
[im 60/92  bone]
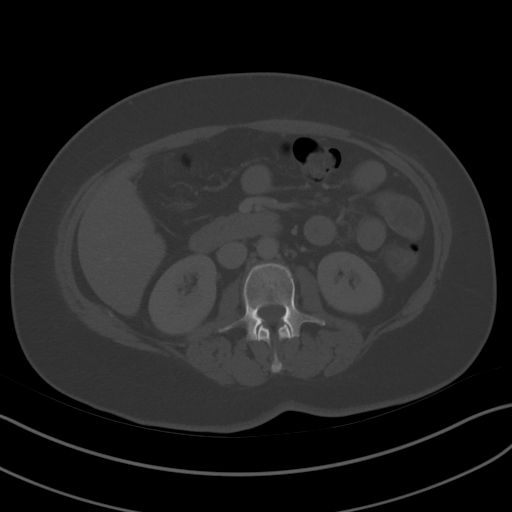
[im 64/92  soft-tissue]
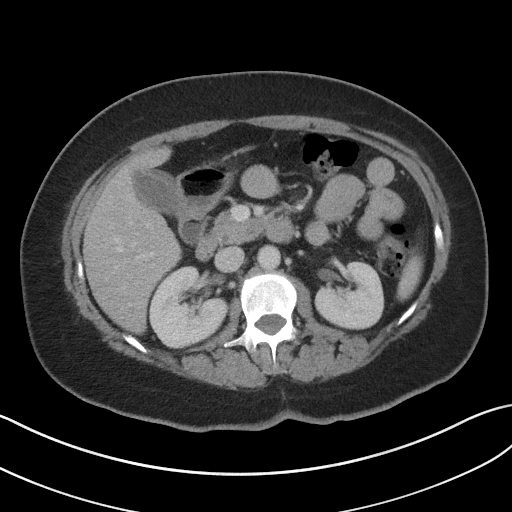
[im 73/92  soft-tissue]
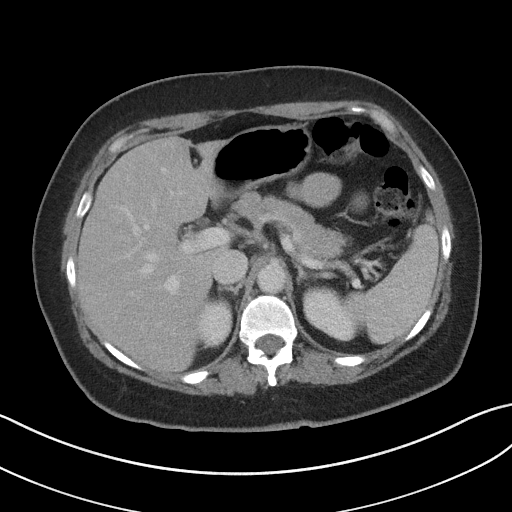
[im 78/92  soft-tissue]
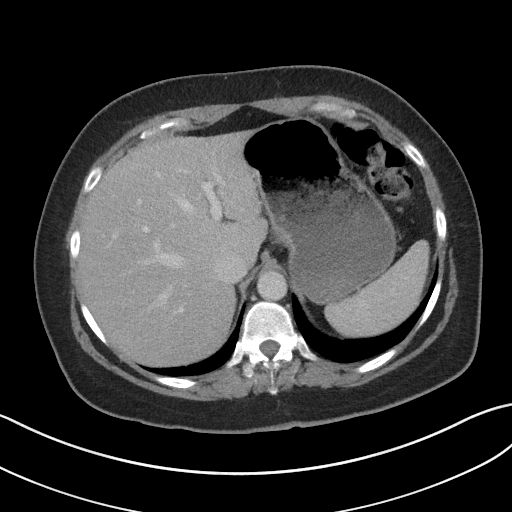
[im 87/92  soft-tissue]
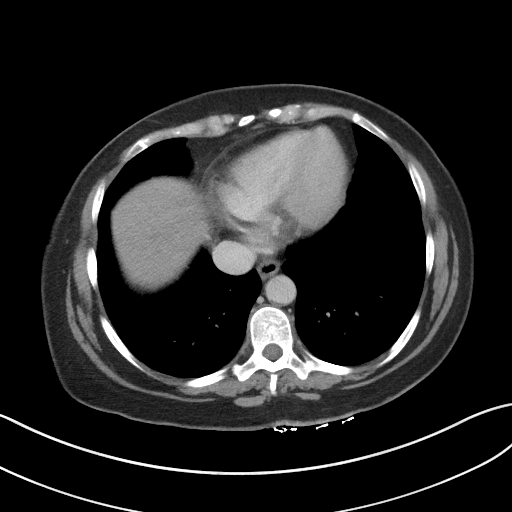

[Series 5: coronal st · coronal · 0.73mm/px · 3 of 89 slices shown]
[im 30/89  soft-tissue]
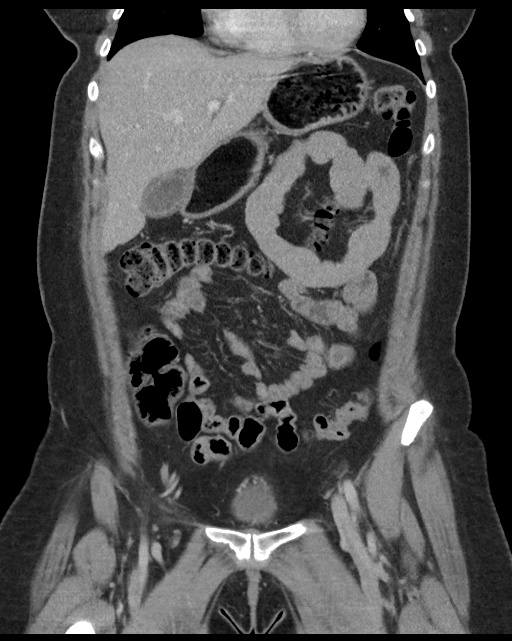
[im 40/89  soft-tissue]
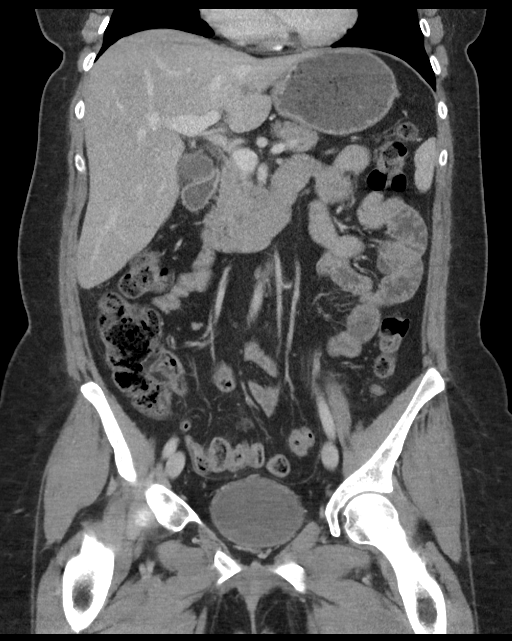
[im 49/89  soft-tissue]
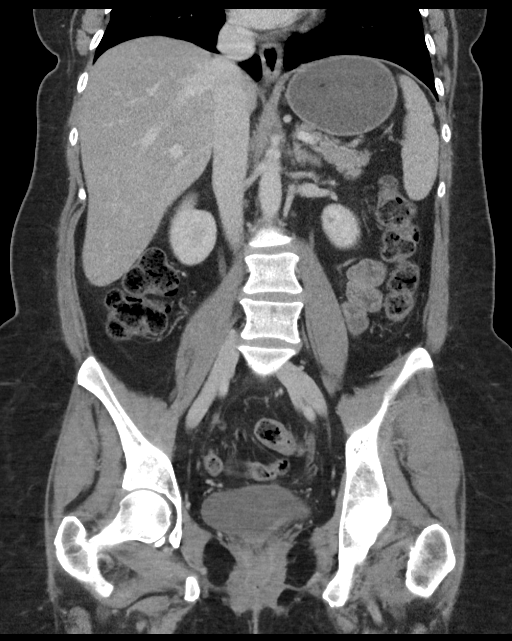

[16 of 46 positions shown; findings below may reference images not displayed]

FINDINGS: Lower chest: Minimal dependent atelectasis at lung bases.

Hepatobiliary: Gallbladder wall thickening and question minimal
pericholecystic fluid. No calcified gallstones or biliary dilatation
evident. Liver unremarkable.

Pancreas: Normal appearance

Spleen: Normal appearance

Adrenals/Urinary Tract: Tiny RIGHT renal cyst. Adrenal glands,
kidneys, ureters, and bladder otherwise unremarkable.

Stomach/Bowel: Normal appendix. Sigmoid diverticulosis. Question
minimal wall thickening of the descending sigmoid junction versus
artifact from underdistention. No pericolic infiltrative changes.
Stomach and remaining bowel loops unremarkable.

Vascular/Lymphatic: Minimal atherosclerotic calcification at aortic
bifurcation. Aorta normal caliber. No adenopathy.

Reproductive: Uterus surgically absent with nonvisualization of
ovaries

Other: No free air free fluid.  No hernia.

Musculoskeletal: Unremarkable
IMPRESSION: Distal colonic diverticulosis with question minimal wall thickening
of the proximal sigmoid colon but no definite pericolic infiltrative
changes to suggest acute diverticulitis.

Thickened gallbladder wall with question minimal pericholecystic
fluid; unable to exclude acute cholecystitis, recommend sonographic
assessment.

## 2018-04-20 ENCOUNTER — Encounter: Payer: Self-pay | Admitting: Family Medicine

## 2018-04-21 ENCOUNTER — Other Ambulatory Visit: Payer: Self-pay | Admitting: Family Medicine

## 2018-04-21 DIAGNOSIS — D75839 Thrombocytosis, unspecified: Secondary | ICD-10-CM

## 2018-04-21 DIAGNOSIS — R197 Diarrhea, unspecified: Secondary | ICD-10-CM

## 2018-04-21 DIAGNOSIS — E038 Other specified hypothyroidism: Secondary | ICD-10-CM

## 2018-04-21 DIAGNOSIS — D473 Essential (hemorrhagic) thrombocythemia: Secondary | ICD-10-CM

## 2018-04-21 DIAGNOSIS — E559 Vitamin D deficiency, unspecified: Secondary | ICD-10-CM

## 2018-04-21 DIAGNOSIS — M2550 Pain in unspecified joint: Secondary | ICD-10-CM

## 2018-04-21 DIAGNOSIS — E785 Hyperlipidemia, unspecified: Secondary | ICD-10-CM

## 2018-04-21 DIAGNOSIS — E538 Deficiency of other specified B group vitamins: Secondary | ICD-10-CM

## 2018-04-21 DIAGNOSIS — R7303 Prediabetes: Secondary | ICD-10-CM

## 2018-04-21 DIAGNOSIS — R140 Abdominal distension (gaseous): Secondary | ICD-10-CM

## 2018-04-23 ENCOUNTER — Other Ambulatory Visit: Payer: Self-pay

## 2018-04-23 ENCOUNTER — Telehealth: Payer: Self-pay

## 2018-04-23 DIAGNOSIS — E038 Other specified hypothyroidism: Secondary | ICD-10-CM

## 2018-04-23 DIAGNOSIS — D473 Essential (hemorrhagic) thrombocythemia: Secondary | ICD-10-CM

## 2018-04-23 DIAGNOSIS — M2550 Pain in unspecified joint: Secondary | ICD-10-CM

## 2018-04-23 DIAGNOSIS — E559 Vitamin D deficiency, unspecified: Secondary | ICD-10-CM

## 2018-04-23 DIAGNOSIS — R140 Abdominal distension (gaseous): Secondary | ICD-10-CM

## 2018-04-23 DIAGNOSIS — R197 Diarrhea, unspecified: Secondary | ICD-10-CM

## 2018-04-23 DIAGNOSIS — D75839 Thrombocytosis, unspecified: Secondary | ICD-10-CM

## 2018-04-23 DIAGNOSIS — E538 Deficiency of other specified B group vitamins: Secondary | ICD-10-CM

## 2018-04-23 DIAGNOSIS — E785 Hyperlipidemia, unspecified: Secondary | ICD-10-CM

## 2018-04-23 DIAGNOSIS — R7303 Prediabetes: Secondary | ICD-10-CM

## 2018-04-23 NOTE — Telephone Encounter (Signed)
Please fax to [facility] as requested.  Thank you

## 2018-04-23 NOTE — Telephone Encounter (Signed)
Copied from [place] [phone]. Topic: Inquiry >> Apr 23, 2018  8:25 AM Monica Hardin wrote: Reason for CRM: Patient works for [employer] and will have all of her labs completed there. Patient would like all of the lab orders Dr. Ancil Boozer ordered on 04/21/2018 faxed to her at [phone]. Patient's phone number is [phone] if there are any questions.       Thank You!!!

## 2018-04-26 LAB — H. PYLORI BREATH TEST: H pylori Breath Test: NEGATIVE

## 2018-04-28 LAB — CBC WITH DIFFERENTIAL/PLATELET
Basophils Absolute: 0 10*3/uL (ref 0.0–0.2)
Basos: 0 %
EOS (ABSOLUTE): 0.1 10*3/uL (ref 0.0–0.4)
Eos: 2 %
Hematocrit: 42.9 % (ref 34.0–46.6)
Hemoglobin: 14.2 g/dL (ref 11.1–15.9)
IMMATURE GRANULOCYTES: 0 %
Immature Grans (Abs): 0 10*3/uL (ref 0.0–0.1)
Lymphocytes Absolute: 2.2 10*3/uL (ref 0.7–3.1)
Lymphs: 34 %
MCH: 31.1 pg (ref 26.6–33.0)
MCHC: 33.1 g/dL (ref 31.5–35.7)
MCV: 94 fL (ref 79–97)
MONOS ABS: 0.3 10*3/uL (ref 0.1–0.9)
Monocytes: 5 %
NEUTROS PCT: 59 %
Neutrophils Absolute: 3.8 10*3/uL (ref 1.4–7.0)
PLATELETS: 310 10*3/uL (ref 150–379)
RBC: 4.57 x10E6/uL (ref 3.77–5.28)
RDW: 13.2 % (ref 12.3–15.4)
WBC: 6.4 10*3/uL (ref 3.4–10.8)

## 2018-04-28 LAB — C-REACTIVE PROTEIN: CRP: 8.6 mg/L — ABNORMAL HIGH (ref 0.0–4.9)

## 2018-04-28 LAB — ANA,IFA RA DIAG PNL W/RFLX TIT/PATN
ANA Titer 1: POSITIVE — AB
Cyclic Citrullin Peptide Ab: 5 units (ref 0–19)

## 2018-04-28 LAB — LIPID PANEL
CHOLESTEROL TOTAL: 238 mg/dL — AB (ref 100–199)
Chol/HDL Ratio: 3.8 ratio (ref 0.0–4.4)
HDL: 62 mg/dL (ref >39)
LDL Calculated: 144 mg/dL — ABNORMAL HIGH (ref 0–99)
TRIGLYCERIDES: 160 mg/dL — AB (ref 0–149)
VLDL Cholesterol Cal: 32 mg/dL (ref 5–40)

## 2018-04-28 LAB — COMPREHENSIVE METABOLIC PANEL
A/G RATIO: 1.6 (ref 1.2–2.2)
ALK PHOS: 76 IU/L (ref 39–117)
ALT: 22 IU/L (ref 0–32)
AST: 19 IU/L (ref 0–40)
Albumin: 4.4 g/dL (ref 3.5–5.5)
BUN/Creatinine Ratio: 21 (ref 9–23)
BUN: 19 mg/dL (ref 6–24)
Bilirubin Total: 0.3 mg/dL (ref 0.0–1.2)
CHLORIDE: 105 mmol/L (ref 96–106)
CO2: 23 mmol/L (ref 20–29)
Calcium: 9.4 mg/dL (ref 8.7–10.2)
Creatinine, Ser: 0.89 mg/dL (ref 0.57–1.00)
GFR calc Af Amer: 86 mL/min/{1.73_m2} (ref >59)
GFR calc non Af Amer: 75 mL/min/{1.73_m2} (ref >59)
GLOBULIN, TOTAL: 2.8 g/dL (ref 1.5–4.5)
Glucose: 84 mg/dL (ref 65–99)
POTASSIUM: 4.5 mmol/L (ref 3.5–5.2)
Sodium: 143 mmol/L (ref 134–144)
Total Protein: 7.2 g/dL (ref 6.0–8.5)

## 2018-04-28 LAB — TSH: TSH: 3.17 u[IU]/mL (ref 0.450–4.500)

## 2018-04-28 LAB — CELIAC DISEASE AB SCREEN W/RFX
ANTIGLIADIN ABS, IGA: 3 U (ref 0–19)
IgA/Immunoglobulin A, Serum: 121 mg/dL (ref 87–352)

## 2018-04-28 LAB — FANA STAINING PATTERNS: Speckled Pattern: 1:160 {titer} — ABNORMAL HIGH

## 2018-04-28 LAB — HEMOGLOBIN A1C
ESTIMATED AVERAGE GLUCOSE: 108 mg/dL
Hgb A1c MFr Bld: 5.4 % (ref 4.8–5.6)

## 2018-04-28 LAB — VITAMIN B12: Vitamin B-12: 389 pg/mL (ref 232–1245)

## 2018-04-28 LAB — VITAMIN D 25 HYDROXY (VIT D DEFICIENCY, FRACTURES): VIT D 25 HYDROXY: 29.6 ng/mL — AB (ref 30.0–100.0)

## 2018-04-28 LAB — SEDIMENTATION RATE: SED RATE: 26 mm/h (ref 0–40)

## 2018-05-01 ENCOUNTER — Ambulatory Visit (INDEPENDENT_AMBULATORY_CARE_PROVIDER_SITE_OTHER): Payer: Commercial Managed Care - PPO | Admitting: Family Medicine

## 2018-05-01 ENCOUNTER — Encounter: Payer: Self-pay | Admitting: Family Medicine

## 2018-05-01 VITALS — BP 124/76 | HR 82 | Temp 98.2°F | Resp 16 | Ht 65.0 in | Wt 176.1 lb

## 2018-05-01 DIAGNOSIS — E039 Hypothyroidism, unspecified: Secondary | ICD-10-CM

## 2018-05-01 DIAGNOSIS — D8989 Other specified disorders involving the immune mechanism, not elsewhere classified: Secondary | ICD-10-CM

## 2018-05-01 DIAGNOSIS — Z01419 Encounter for gynecological examination (general) (routine) without abnormal findings: Secondary | ICD-10-CM

## 2018-05-01 DIAGNOSIS — M2550 Pain in unspecified joint: Secondary | ICD-10-CM

## 2018-05-01 DIAGNOSIS — R202 Paresthesia of skin: Secondary | ICD-10-CM

## 2018-05-01 DIAGNOSIS — R030 Elevated blood-pressure reading, without diagnosis of hypertension: Secondary | ICD-10-CM | POA: Diagnosis not present

## 2018-05-01 DIAGNOSIS — R5382 Chronic fatigue, unspecified: Secondary | ICD-10-CM | POA: Diagnosis not present

## 2018-05-01 DIAGNOSIS — R768 Other specified abnormal immunological findings in serum: Secondary | ICD-10-CM

## 2018-05-01 DIAGNOSIS — R140 Abdominal distension (gaseous): Secondary | ICD-10-CM

## 2018-05-01 DIAGNOSIS — R7982 Elevated C-reactive protein (CRP): Secondary | ICD-10-CM | POA: Diagnosis not present

## 2018-05-01 MED ORDER — LEVOTHYROXINE SODIUM 112 MCG PO TABS: 112.0000 ug | ORAL_TABLET | Freq: Every day | ORAL | 1 refills | Status: DC

## 2018-05-01 NOTE — Progress Notes (Signed)
Name: Monica Hardin   MRN: [id number]    DOB: March 15, 1966   Date:05/01/2018       Progress Note  Subjective  Chief Complaint  Chief Complaint  Patient presents with  . Annual Exam  . Follow-up    HPI   Patient presents for annual CPE and follow up and discuss labs  Abnormal labs: she contacted [place]  last week stating worsening of fatigue, welling, joint aches, stiffness that is worse in am and when she goes to bed a night. Occasional rash. Possible family history of lupus ( her aunt). She states symptoms much more severe since Feb. She has a history of chronic fatigue with normal labs in the past, but this is different and she has been concerned about it. Creactive protein, ANA and reflex positive. We will refer her to rheumatologist. She also has occasional blurred vision, but normal hgbA1C  Elevated BP: no previous history of hypertension, she is nervous today because she does not feel well, we will check urine micro and EKG and monitor for now.   Adult hypothyroidism: she has been on same dose of levothyroxine for a long time, but getting bloated, gaining weight and fatigue is worse. TSH is trending up and we will adjust dose of medication  USPSTF grade A and B recommendations  Depression:  Depression screen [facility] 2/9 05/01/2018 03/21/2017 10/19/2016 04/19/2016 07/07/2015  Decreased Interest 0 0 0 0 0  Down, Depressed, Hopeless 0 0 0 0 0  PHQ - 2 Score 0 0 0 0 0   Hypertension: BP Readings from Last 3 Encounters:  05/01/18 (!) 140/100  10/31/17 100/70  04/04/17 137/69   Obesity: Wt Readings from Last 3 Encounters:  05/01/18 176 lb 1.6 oz (79.9 kg)  10/31/17 165 lb 14.4 oz (75.3 kg)  04/04/17 168 lb (76.2 kg)   BMI Readings from Last 3 Encounters:  05/01/18 29.30 kg/m  10/31/17 26.78 kg/m  04/04/17 27.12 kg/m   HIV, hep B, hep C: up to date STD testing and prevention (chl/gon/syphilis): not a candidate  Intimate partner violence:negative Sexual History/Pain during  Intercourse: no pain  Menstrual History/LMP/Abnormal Bleeding:s/p hysterectomy  Incontinence Symptoms: incomplete void  Advanced Care Planning: A voluntary discussion about advance care planning including the explanation and discussion of advance directives.  Discussed health care proxy and Living will, and the patient was able to identify a health care proxy as husband  Patient does have a living will at present time. Breast cancer:  HM Mammogram  Date Value Ref Range Status  05/28/2014 Normal  Final    BRCA gene screening: N/A Cervical cancer screening: not a candidate, s/p hysterectomy,  Osteoporosis:  We will start at age 44   Fall prevention/vitamin D: continue vitamin D supplementation   Lipids:  Lab Results  Component Value Date   CHOL 238 (H) 04/25/2018   CHOL 215 (H) 10/31/2017   CHOL 224 (H) 03/24/2017   Lab Results  Component Value Date   HDL 62 04/25/2018   HDL 59 10/31/2017   HDL 61 03/24/2017   Lab Results  Component Value Date   LDLCALC 144 (H) 04/25/2018   LDLCALC 131 (H) 10/31/2017   LDLCALC 140 (H) 03/24/2017   Lab Results  Component Value Date   TRIG 160 (H) 04/25/2018   TRIG 123 10/31/2017   TRIG 114 03/24/2017   Lab Results  Component Value Date   CHOLHDL 3.8 04/25/2018   CHOLHDL 3.6 10/31/2017   CHOLHDL 3.7 03/24/2017   No results  found for: LDLDIRECT  Glucose:  Glucose  Date Value Ref Range Status  04/25/2018 84 65 - 99 mg/dL Final  10/31/2017 88 65 - 99 mg/dL Final  03/24/2017 83 65 - 99 mg/dL Final   Glucose, Bld  Date Value Ref Range Status  03/27/2017 114 (H) 65 - 99 mg/dL Final  03/26/2017 123 (H) 65 - 99 mg/dL Final    Skin cancer: no change in lesions Colorectal cancer: in 2017 Lung cancer:   Low Dose CT Chest recommended if Age 40-80 years, 30 pack-year currently smoking OR have quit w/in 15years. Patient does not qualify.   ECG: today    Patient Active Problem List   Diagnosis Date Noted  . Encounter for screening  colonoscopy 11/07/2016  . Surgical menopause on hormone replacement therapy 09/01/2016  . Vitamin D deficiency 04/20/2016  . Low vitamin B12 level 04/20/2016  . History of uterine leiomyoma 04/19/2016  . Sciatica, right side 04/19/2016  . Status post TAH-BSO 11/12/2015  . History of ovarian cyst 11/12/2015  . Dyslipidemia 07/04/2015  . Adult hypothyroidism 07/04/2015  . Excess weight 07/04/2015  . Disturbance of skin sensation 07/04/2015  . Lipoma of arm 12/03/2014  . CFIDS (chronic fatigue and immune dysfunction syndrome) ([place]) 05/08/2007    Past Surgical History:  Procedure Laterality Date  . ABDOMINAL HYSTERECTOMY N/A 10/05/2015   Procedure: HYSTERECTOMY ABDOMINAL;  Surgeon: [name], MD;  Location: [facility] ORS;  Service: Gynecology;  Laterality: N/A;  . CHOLECYSTECTOMY N/A 03/27/2017   Procedure: LAPAROSCOPIC CHOLECYSTECTOMY;  Surgeon: Olean Ree, MD;  Location: [facility] ORS;  Service: General;  Laterality: N/A;  . COLONOSCOPY WITH PROPOFOL N/A 11/22/2016   Procedure: COLONOSCOPY WITH PROPOFOL;  Surgeon: Robert Bellow, MD;  Location: [facility] ENDOSCOPY;  Service: Endoscopy;  Laterality: N/A;  . FRACTURE SURGERY    . LAPAROSCOPY N/A 08/24/2015   Procedure: LAPAROSCOPY DIAGNOSTIC;  Surgeon: [name], MD;  Location: [facility] ORS;  Service: Gynecology;  Laterality: N/A;  . LEEP  [id number]   [facility]  . [place]  . SALPINGOOPHORECTOMY Bilateral 10/05/2015   Procedure: SALPINGO OOPHORECTOMY;  Surgeon: [name], MD;  Location: [facility] ORS;  Service: Gynecology;  Laterality: Bilateral;    Family History  Problem Relation Age of Onset  . Lung cancer Mother   . Cancer Mother   . Lung cancer Father   . Cancer Father   . Kidney Stones Daughter   . Asthma Son   . Breast cancer Paternal Aunt 91  . Diabetes Paternal Grandmother     Social History   Socioeconomic History  . Marital status: Married    Spouse name: Marden Noble  . Number of  children: 2  . Years of education: Not on file  . Highest education level: Associate degree: academic program  Occupational History  . Occupation: Office manager: [employer]  Social Needs  . Financial resource strain: Not very hard  . Food insecurity:    Worry: Never true    Inability: Never true  . Transportation needs:    Medical: No    Non-medical: No  Tobacco Use  . Smoking status: Never Smoker  . Smokeless tobacco: Never Used  Substance and Sexual Activity  . Alcohol use: No    Alcohol/week: 0.0 oz  . Drug use: No  . Sexual activity: Yes    Partners: Male    Birth control/protection: None    Comment: Hysterectomy  Lifestyle  . Physical activity:    Days  per week: 3 days    Minutes per session: 10 min  . Stress: Not at all  Relationships  . Social connections:    Talks on phone: More than three times a week    Gets together: Twice a week    Attends religious service: More than 4 times per year    Active member of club or organization: Yes    Attends meetings of clubs or organizations: More than 4 times per year    Relationship status: Married  . Intimate partner violence:    Fear of current or ex partner: No    Emotionally abused: No    Physically abused: No    Forced sexual activity: No  Other Topics Concern  . Not on file  Social History Narrative   Two grown children, married   Works at [employer]      Current Outpatient Medications:  .  estradiol (ESTRACE) 1 MG tablet, TAKE 1 TABLET BY MOUTH ONCE DAILY, Disp: 90 tablet, Rfl: 1 .  levothyroxine (SYNTHROID, LEVOTHROID) 112 MCG tablet, Take 1 tablet (112 mcg total) by mouth daily before breakfast., Disp: 90 tablet, Rfl: 1 .  Vitamin D, Ergocalciferol, (DRISDOL) 50000 units CAPS capsule, TAKE 1 CAPSULE BY MOUTH ONCE A WEEK, Disp: 12 capsule, Rfl: 0  No Known Allergies   ROS   Constitutional: Negative for fever, positive for weight change.  Respiratory: Negative for cough and shortness of breath.    Cardiovascular: Negative for chest pain or palpitations.  Gastrointestinal: Negative for abdominal pain, no bowel changes.  Musculoskeletal: Negative for gait problem or joint swelling.  Skin:positive for intermittent rash - forehead and feet   Neurological: Negative for dizziness or headache.  No other specific complaints in a complete review of systems (except as listed in HPI above).   Objective  Vitals:   05/01/18 0825  BP: (!) 140/100  Pulse: 82  Resp: 16  Temp: 98.2 F (36.8 C)  TempSrc: Oral  SpO2: 98%  Weight: 176 lb 1.6 oz (79.9 kg)  Height: '5\' 5"'  (1.651 m)    Body mass index is 29.3 kg/m.  Physical Exam  Constitutional: Patient appears well-developed and well-nourished. Overweight. No distress.  HENT: Head: Normocephalic and atraumatic. Ears: B TMs ok, no erythema or effusion; Nose: Nose normal. Mouth/Throat: Oropharynx is clear and moist. No oropharyngeal exudate.  Eyes: Conjunctivae and EOM are normal. Pupils are equal, round, and reactive to light. No scleral icterus.  Neck: Normal range of motion. Neck supple. No JVD present. No thyromegaly present.  Cardiovascular: Normal rate, regular rhythm and normal heart sounds.  No murmur heard. No BLE edema. Pulmonary/Chest: Effort normal and breath sounds normal. No respiratory distress. Abdominal: Soft. Bowel sounds are normal, no distension. There is no tenderness. no masses Breast: no lumps or masses, no nipple discharge or rashes FEMALE GENITALIA:  Not done RECTAL: not done Musculoskeletal: Normal range of motion, no joint effusions. No gross deformities. No synovitis  Neurological: he is alert and oriented to person, place, and time. No cranial nerve deficit. Coordination, balance, strength, speech and gait are normal.  Skin: Skin is warm and dry. Small rash on left lateral foot, brown and flaky , no malar rash  Psychiatric: Patient has a normal mood and affect. behavior is normal. Judgment and thought content  normal.   Recent Results (from the past 2160 hour(s))  TSH     Status: None   Collection Time: 04/25/18  7:15 AM  Result Value Ref Range   TSH  3.170 0.450 - 4.500 uIU/mL  Celiac Disease Ab Screen w/Rfx     Status: None   Collection Time: 04/25/18  7:15 AM  Result Value Ref Range   Antigliadin Abs, IgA 3 0 - 19 units    Comment:                    Negative                   0 - 19                    Weak Positive             20 - 30                    Moderate to Strong Positive   >30    Transglutaminase IgA <2 0 - 3 U/mL    Comment:                               Negative        0 -  3                               Weak Positive   4 - 10                               Positive           >10  Tissue Transglutaminase (tTG) has been identified  as the endomysial antigen.  Studies have demonstr-  ated that endomysial IgA antibodies have over 99%  specificity for gluten sensitive enteropathy.    IgA/Immunoglobulin A, Serum 121 87 - 352 mg/dL  ANA,IFA RA Diag Pnl w/rflx Tit/Patn     Status: Abnormal   Collection Time: 04/25/18  7:15 AM  Result Value Ref Range   ANA Titer 1 Positive (A)     Comment:                                      Negative   <1:80                                      Borderline  1:80                                      Positive   >1:80    Rhuematoid fact SerPl-aCnc <10.0 0.0 - 41.9 IU/mL   Cyclic Citrullin Peptide Ab 5 0 - 19 units    Comment:                           Negative               <20                           Weak positive      20 - 39  Moderate positive  40 - 59                           Strong positive        >59   Sedimentation rate     Status: None   Collection Time: 04/25/18  7:15 AM  Result Value Ref Range   Sed Rate 26 0 - 40 mm/hr  C-reactive protein     Status: Abnormal   Collection Time: 04/25/18  7:15 AM  Result Value Ref Range   CRP 8.6 (H) 0.0 - 4.9 mg/L  Comprehensive metabolic panel     Status: None    Collection Time: 04/25/18  7:15 AM  Result Value Ref Range   Glucose 84 65 - 99 mg/dL   BUN 19 6 - 24 mg/dL   Creatinine, Ser 0.89 0.57 - 1.00 mg/dL   GFR calc non Af Amer 75 >59 mL/min/1.73   GFR calc Af Amer 86 >59 mL/min/1.73   BUN/Creatinine Ratio 21 9 - 23   Sodium 143 134 - 144 mmol/L   Potassium 4.5 3.5 - 5.2 mmol/L   Chloride 105 96 - 106 mmol/L   CO2 23 20 - 29 mmol/L   Calcium 9.4 8.7 - 10.2 mg/dL   Total Protein 7.2 6.0 - 8.5 g/dL   Albumin 4.4 3.5 - 5.5 g/dL   Globulin, Total 2.8 1.5 - 4.5 g/dL   Albumin/Globulin Ratio 1.6 1.2 - 2.2   Bilirubin Total 0.3 0.0 - 1.2 mg/dL   Alkaline Phosphatase 76 39 - 117 IU/L   AST 19 0 - 40 IU/L   ALT 22 0 - 32 IU/L  Lipid panel     Status: Abnormal   Collection Time: 04/25/18  7:15 AM  Result Value Ref Range   Cholesterol, Total 238 (H) 100 - 199 mg/dL   Triglycerides 160 (H) 0 - 149 mg/dL   HDL 62 >39 mg/dL   VLDL Cholesterol Cal 32 5 - 40 mg/dL   LDL Calculated 144 (H) 0 - 99 mg/dL   Chol/HDL Ratio 3.8 0.0 - 4.4 ratio    Comment:                                   T. Chol/HDL Ratio                                             Men  Women                               1/2 Avg.Risk  3.4    3.3                                   Avg.Risk  5.0    4.4                                2X Avg.Risk  9.6    7.1  3X Avg.Risk 23.4   11.0   Hemoglobin A1c     Status: None   Collection Time: 04/25/18  7:15 AM  Result Value Ref Range   Hgb A1c MFr Bld 5.4 4.8 - 5.6 %    Comment:          Prediabetes: 5.7 - 6.4          Diabetes: >6.4          Glycemic control for adults with diabetes: <7.0    Est. average glucose Bld gHb Est-mCnc 108 mg/dL  VITAMIN D 25 Hydroxy (Vit-D Deficiency, Fractures)     Status: Abnormal   Collection Time: 04/25/18  7:15 AM  Result Value Ref Range   Vit D, 25-Hydroxy 29.6 (L) 30.0 - 100.0 ng/mL    Comment: Vitamin D deficiency has been defined by the [place] and an  Endocrine Society practice guideline as a level of serum 25-OH vitamin D less than 20 ng/mL (1,2). The Endocrine Society went on to further define vitamin D insufficiency as a level between 21 and 29 ng/mL (2). 1. IOM (Institute of Medicine). 2010. Dietary reference    intakes for calcium and D. [place]: The    [employer]. 2. Holick MF, Binkley [place], Bischoff-Ferrari HA, et al.    Evaluation, treatment, and prevention of vitamin D    deficiency: an Endocrine Society clinical practice    guideline. JCEM. 2011 Jul; 96(7):1911-30.   Vitamin B12     Status: None   Collection Time: 04/25/18  7:15 AM  Result Value Ref Range   Vitamin B-12 389 232 - 1,245 pg/mL  CBC with Differential/Platelet     Status: None   Collection Time: 04/25/18  7:15 AM  Result Value Ref Range   WBC 6.4 3.4 - 10.8 x10E3/uL   RBC 4.57 3.77 - 5.28 x10E6/uL   Hemoglobin 14.2 11.1 - 15.9 g/dL   Hematocrit 42.9 34.0 - 46.6 %   MCV 94 79 - 97 fL   MCH 31.1 26.6 - 33.0 pg   MCHC 33.1 31.5 - 35.7 g/dL   RDW 13.2 12.3 - 15.4 %   Platelets 310 150 - 379 x10E3/uL   Neutrophils 59 Not Estab. %   Lymphs 34 Not Estab. %   Monocytes 5 Not Estab. %   Eos 2 Not Estab. %   Basos 0 Not Estab. %   Neutrophils Absolute 3.8 1.4 - 7.0 x10E3/uL   Lymphocytes Absolute 2.2 0.7 - 3.1 x10E3/uL   Monocytes Absolute 0.3 0.1 - 0.9 x10E3/uL   EOS (ABSOLUTE) 0.1 0.0 - 0.4 x10E3/uL   Basophils Absolute 0.0 0.0 - 0.2 x10E3/uL   Immature Granulocytes 0 Not Estab. %   Immature Grans (Abs) 0.0 0.0 - 0.1 x10E3/uL  FANA Staining Patterns     Status: Abnormal   Collection Time: 04/25/18  7:15 AM  Result Value Ref Range   Speckled Pattern 1:160 (H)    Note: Comment     Comment: A positive ANA result may occur in healthy individuals (low titer) or be associated with a variety of diseases.  See interpretation chart which is not all inclusive: Pattern      Antigen Detected  Suggested Disease Association -----------   ----------------  ----------------------------- Homogeneous  DNA(ds,ss),       SLE - High titers              Nucleosomes,              Histones  Drug-induced SLE -----------  ----------------  ----------------------------- Speckled     Sm, RNP, SCL-70,  SLE,MCTD,PSS (diffuse form),              SS-A/SS-B         Sjogrens -----------  ----------------  ----------------------------- Nucleolar    SCL-70, PM-1/SCL  High titers Scleroderma,                                PM/DM -----------  ----------------  ----------------------------- Centromere   Centromere        PSS (limited form) w/Crest                                syndrome variable -----------  ----------------  ----------------------------- Nuclear Dot  Sp100,p80-coil in  Primary Biliary Cirrhosis -----------  ----------------  ----------------------------- Nuclear      GP210,            Primary Biliary Cirrhosis Membrane     lamin A,B,C -----------  ----------------  -----------------------------   H. pylori breath test     Status: None   Collection Time: 04/25/18  7:24 AM  Result Value Ref Range   H pylori Breath Test Negative Negative      PHQ2/9: Depression screen [facility] 2/9 05/01/2018 03/21/2017 10/19/2016 04/19/2016 07/07/2015  Decreased Interest 0 0 0 0 0  Down, Depressed, Hopeless 0 0 0 0 0  PHQ - 2 Score 0 0 0 0 0     Fall Risk: Fall Risk  05/01/2018 10/31/2017 03/21/2017 10/19/2016 04/19/2016  Falls in the past year? No No No No No    Functional Status Survey: Is the patient deaf or have difficulty hearing?: No Does the patient have difficulty seeing, even when wearing glasses/contacts?: No Does the patient have difficulty concentrating, remembering, or making decisions?: No Does the patient have difficulty walking or climbing stairs?: No Does the patient have difficulty dressing or bathing?: No Does the patient have difficulty doing errands alone such as visiting a doctor's office or shopping?:  No  Assessment & Plan  1. Well woman exam  Discussed importance of 150 minutes of physical activity weekly, eat two servings of fish weekly, eat one serving of tree nuts ( cashews, pistachios, pecans, almonds.Marland Kitchen) every other day, eat 6 servings of fruit/vegetables daily and drink plenty of water and avoid sweet beverages.   2. CFIDS (chronic fatigue and immune dysfunction syndrome) ([place])  For years now  3. Arthralgia, unspecified joint  - Ambulatory referral to Rheumatology  4. Elevated antinuclear antibody (ANA) level  - Ambulatory referral to Rheumatology  5. Elevated C-reactive protein  - Ambulatory referral to Rheumatology  6. Bloating  With weight gain, swelling, we will refer to rheumatologist before we get any other tests done to evaluate that - she is s/p hysterectomy and bilateral oophorectomy   7. Paresthesia of both hands  Try to hold off to get NCS until seen by Rheumatologist    8. Elevated blood pressure reading  Stressed about not feeling well and lab results.    9. Adult hypothyroidism  We will increase dose from 100 daily and 150 once weekly to 112. She has been feeling more tired, gaining weight and last TSH has gone up - levothyroxine (SYNTHROID, LEVOTHROID) 112 MCG tablet; Take 1 tablet (112 mcg total) by mouth daily before breakfast.  Dispense: 90 tablet; Refill: 1

## 2018-05-01 NOTE — Patient Instructions (Signed)
Preventive Care 40-64 Years, Female Preventive care refers to lifestyle choices and visits with your health care provider that can promote health and wellness. What does preventive care include?  A yearly physical exam. This is also called an annual well check.  Dental exams once or twice a year.  Routine eye exams. Ask your health care provider how often you should have your eyes checked.  Personal lifestyle choices, including: ? Daily care of your teeth and gums. ? Regular physical activity. ? Eating a healthy diet. ? Avoiding tobacco and drug use. ? Limiting alcohol use. ? Practicing safe sex. ? Taking low-dose aspirin daily starting at age 58. ? Taking vitamin and mineral supplements as recommended by your health care provider. What happens during an annual well check? The services and screenings done by your health care provider during your annual well check will depend on your age, overall health, lifestyle risk factors, and family history of disease. Counseling Your health care provider may ask you questions about your:  Alcohol use.  Tobacco use.  Drug use.  Emotional well-being.  Home and relationship well-being.  Sexual activity.  Eating habits.  Work and work Statistician.  Method of birth control.  Menstrual cycle.  Pregnancy history.  Screening You may have the following tests or measurements:  Height, weight, and BMI.  Blood pressure.  Lipid and cholesterol levels. These may be checked every 5 years, or more frequently if you are over 81 years old.  Skin check.  Lung cancer screening. You may have this screening every year starting at age 78 if you have a 30-pack-year history of smoking and currently smoke or have quit within the past 15 years.  Fecal occult blood test (FOBT) of the stool. You may have this test every year starting at age 65.  Flexible sigmoidoscopy or colonoscopy. You may have a sigmoidoscopy every 5 years or a colonoscopy  every 10 years starting at age 30.  Hepatitis C blood test.  Hepatitis B blood test.  Sexually transmitted disease (STD) testing.  Diabetes screening. This is done by checking your blood sugar (glucose) after you have not eaten for a while (fasting). You may have this done every 1-3 years.  Mammogram. This may be done every 1-2 years. Talk to your health care provider about when you should start having regular mammograms. This may depend on whether you have a family history of breast cancer.  BRCA-related cancer screening. This may be done if you have a family history of breast, ovarian, tubal, or peritoneal cancers.  Pelvic exam and Pap test. This may be done every 3 years starting at age 80. Starting at age 36, this may be done every 5 years if you have a Pap test in combination with an HPV test.  Bone density scan. This is done to screen for osteoporosis. You may have this scan if you are at high risk for osteoporosis.  Discuss your test results, treatment options, and if necessary, the need for more tests with your health care provider. Vaccines Your health care provider may recommend certain vaccines, such as:  Influenza vaccine. This is recommended every year.  Tetanus, diphtheria, and acellular pertussis (Tdap, Td) vaccine. You may need a Td booster every 10 years.  Varicella vaccine. You may need this if you have not been vaccinated.  Zoster vaccine. You may need this after age 5.  Measles, mumps, and rubella (MMR) vaccine. You may need at least one dose of MMR if you were born in  1958 or later. You may also need a second dose.  Pneumococcal 13-valent conjugate (PCV13) vaccine. You may need this if you have certain conditions and were not previously vaccinated.  Pneumococcal polysaccharide (PPSV23) vaccine. You may need one or two doses if you smoke cigarettes or if you have certain conditions.  Meningococcal vaccine. You may need this if you have certain  conditions.  Hepatitis A vaccine. You may need this if you have certain conditions or if you travel or work in places where you may be exposed to hepatitis A.  Hepatitis B vaccine. You may need this if you have certain conditions or if you travel or work in places where you may be exposed to hepatitis B.  Haemophilus influenzae type b (Hib) vaccine. You may need this if you have certain conditions.  Talk to your health care provider about which screenings and vaccines you need and how often you need them. This information is not intended to replace advice given to you by your health care provider. Make sure you discuss any questions you have with your health care provider. Document Released: 01/08/2016 Document Revised: 08/31/2016 Document Reviewed: 10/13/2015 Elsevier Interactive Patient Education  2018 [employer]  

## 2018-05-17 NOTE — Progress Notes (Signed)
Office Visit Note  Patient: Monica Hardin             Date of Birth: 1966-03-01           MRN: [id number]             PCP: Steele Sizer, MD Referring: Steele Sizer, MD Visit Date: 05/29/2018 Occupation: Billing department at [employer]    Subjective:  Generalized pain  History of Present Illness: Monica Hardin is a 52 y.o. female seen in consultation per request of her PCP for positive ANA.  According to patient her symptoms started about 14 years ago with numbness and tingling in her extremities and joint pain.  She states the pain became generalized over time.  She was seen by several physicians.  In the beginning she had work-up for multiple sclerosis.  The diagnosis was dismissed after 5 years.  She was established with her current PCP after that.  And was given the diagnosis of chronic fatigue syndrome and fibromyalgia syndrome.  She has never had any treatment.  She states in February 2019 she started experiencing increased pain and bloating sensation.  She also feels that her extremities feels swollen.  She feels very stiff.  She had some recent labs which showed elevated CRP and positive ANA for that reason she was referred to me.  She is been also experiencing frequency of urination.    Activities of Daily Living:  Patient reports morning stiffness for 1-2 hours.   Patient Reports nocturnal pain.  Difficulty dressing/grooming: Denies Difficulty climbing stairs: Reports Difficulty getting out of chair: Reports Difficulty using hands for taps, buttons, cutlery, and/or writing: Reports   Review of Systems  Constitutional: Positive for fatigue. Negative for night sweats, weight gain and weight loss.  HENT: Positive for mouth dryness. Negative for mouth sores, trouble swallowing, trouble swallowing and nose dryness.   Eyes: Positive for dryness. Negative for pain, redness and visual disturbance.  Respiratory: Negative for cough, hemoptysis, shortness of breath and  difficulty breathing.   Cardiovascular: Negative for chest pain, palpitations, hypertension, irregular heartbeat and swelling in legs/feet.  Gastrointestinal: Negative for blood in stool, constipation and diarrhea.  Endocrine: Negative for increased urination.  Genitourinary: Negative for painful urination and vaginal dryness.  Musculoskeletal: Positive for arthralgias, joint pain, myalgias, morning stiffness and myalgias. Negative for joint swelling, muscle weakness and muscle tenderness.  Skin: Positive for color change, rash (Facial rash), hair loss and sensitivity to sunlight. Negative for pallor, nodules/bumps, skin tightness and ulcers.  Allergic/Immunologic: Negative for susceptible to infections.  Neurological: Positive for dizziness. Negative for numbness, headaches, memory loss, night sweats and weakness.  Hematological: Negative for swollen glands.  Psychiatric/Behavioral: Negative for depressed mood and sleep disturbance. The patient is not nervous/anxious.     PMFS History:  Patient Active Problem List   Diagnosis Date Noted  . Encounter for screening colonoscopy 11/07/2016  . Surgical menopause on hormone replacement therapy 09/01/2016  . Vitamin D deficiency 04/20/2016  . Low vitamin B12 level 04/20/2016  . History of uterine leiomyoma 04/19/2016  . Sciatica, right side 04/19/2016  . Status post TAH-BSO 11/12/2015  . History of ovarian cyst 11/12/2015  . Dyslipidemia 07/04/2015  . Adult hypothyroidism 07/04/2015  . Excess weight 07/04/2015  . Disturbance of skin sensation 07/04/2015  . Lipoma of arm 12/03/2014  . CFIDS (chronic fatigue and immune dysfunction syndrome) ([place]) 05/08/2007    Past Medical History:  Diagnosis Date  . Chronic fatigue   . Hyperlipidemia   .  Hypothyroidism   . Overweight   . Sciatica, right side   . Thyroid disease     Family History  Problem Relation Age of Onset  . Lung cancer Mother   . Cancer Mother   . Lung cancer Father   .  Cancer Father   . Kidney Stones Daughter   . Asthma Son   . Breast cancer Paternal Aunt 63  . Diabetes Paternal Grandmother   . Thyroid disease Sister    Past Surgical History:  Procedure Laterality Date  . ABDOMINAL HYSTERECTOMY N/A 10/05/2015   Procedure: HYSTERECTOMY ABDOMINAL;  Surgeon: [name], MD;  Location: [facility];  Service: Gynecology;  Laterality: N/A;  . CHOLECYSTECTOMY N/A 03/27/2017   Procedure: LAPAROSCOPIC CHOLECYSTECTOMY;  Surgeon: Olean Ree, MD;  Location: [facility];  Service: General;  Laterality: N/A;  . COLONOSCOPY WITH PROPOFOL N/A 11/22/2016   Procedure: COLONOSCOPY WITH PROPOFOL;  Surgeon: Robert Bellow, MD;  Location: [facility];  Service: Endoscopy;  Laterality: N/A;  . FRACTURE SURGERY    . LAPAROSCOPY N/A 08/24/2015   Procedure: LAPAROSCOPY DIAGNOSTIC;  Surgeon: [name], MD;  Location: [facility];  Service: Gynecology;  Laterality: N/A;  . LEEP  [id number]   [facility]  . [place]  . SALPINGOOPHORECTOMY Bilateral 10/05/2015   Procedure: SALPINGO OOPHORECTOMY;  Surgeon: [name], MD;  Location: [facility];  Service: Gynecology;  Laterality: Bilateral;   Social History   Social History Narrative   Two grown children, married   Works at [employer]      Objective: Vital Signs: BP (!) 145/71 (BP Location: Left Arm, Patient Position: Sitting, Cuff Size: Normal)   Pulse 66   Resp 15   Ht 5\' 5"  (1.651 m)   Wt 178 lb (80.7 kg)   LMP 10/01/2015 (Exact Date)   BMI 29.62 kg/m    Physical Exam  Constitutional: She is oriented to person, place, and time. She appears well-developed and well-nourished.  HENT:  Head: Normocephalic and atraumatic.  Eyes: Conjunctivae and EOM are normal.  Neck: Normal range of motion.  Cardiovascular: Normal rate, regular rhythm, normal heart sounds and intact distal pulses.  Pulmonary/Chest: Effort normal and breath sounds normal.  Abdominal: Soft. Bowel sounds are  normal.  Lymphadenopathy:    She has no cervical adenopathy.  Neurological: She is alert and oriented to person, place, and time.  Skin: Skin is warm and dry. Capillary refill takes less than 2 seconds.  Psychiatric: She has a normal mood and affect. Her behavior is normal.  Nursing note and vitals reviewed.    Musculoskeletal Exam: C-spine thoracic lumbar spine good range of motion.  Shoulder joints elbow joints wrist joint MCPs PIPs DIPs were in good range of motion.  She has mild DIP thickening.  Hip joints knee joints ankles MTPs PIPs DIPs were in good range of motion.  She has discomfort in her knee joints and getting up from the chair.  She also complains of discomfort across her PIPs in her feet but no synovitis was noted.  She has generalized hyperalgesia and positive tender points 18 out of 18 positive.  CDAI Exam: No CDAI exam completed.    Investigation: Findings:  04/25/18: Anti-gliadin abs 3, Transglutaminase IgA <2, IgA immunoglobulins 121, ANA 1:160 speckled, RF <10, CCP 5, Sed rate 26, CRP 8.6, TSH 3.170, vitamin D 29.6, vitamin b12 389, LDL 144, TG 160, cholesterol 238, H pylori negative    Component     Latest Ref Rng &  Units 04/25/2018  Antigliadin Abs, IgA     0 - 19 units 3  Transglutaminase IgA     0 - 3 U/mL <2  IgA/Immunoglobulin A, Serum     87 - 352 mg/dL 121  ANA Titer 1      Positive (A)  RA Latex Turbid.     0.0 - 13.9 IU/mL <15.1  Cyclic Citrullin Peptide Ab     0 - 19 units 5  TSH     0.450 - 4.500 uIU/mL 3.170  Sed Rate     0 - 40 mm/hr 26  CRP     0.0 - 4.9 mg/L 8.6 (H)   Component     Latest Ref Rng & Units 04/25/2018  Cholesterol, Total     100 - 199 mg/dL 238 (H)  Triglycerides     0 - 149 mg/dL 160 (H)  HDL Cholesterol     >39 mg/dL 62  VLDL Cholesterol Cal     5 - 40 mg/dL 32  LDL (calc)     0 - 99 mg/dL 144 (H)  Total CHOL/HDL Ratio     0.0 - 4.4 ratio 3.8  Hemoglobin A1C     4.8 - 5.6 % 5.4  Est. average glucose Bld gHb  Est-mCnc     mg/dL 108  Vitamin D, 25-Hydroxy     30.0 - 100.0 ng/mL 29.6 (L)  Vitamin B12     232 - 1,245 pg/mL 389    CBC Latest Ref Rng & Units 04/25/2018 10/31/2017 03/27/2017  WBC 3.4 - 10.8 x10E3/uL 6.4 5.9 8.7  Hemoglobin 11.1 - 15.9 g/dL 14.2 13.9 12.9  Hematocrit 34.0 - 46.6 % 42.9 42.0 38.0  Platelets 150 - 379 x10E3/uL 310 338 271   CMP Latest Ref Rng & Units 04/25/2018 10/31/2017 03/27/2017  Glucose 65 - 99 mg/dL 84 88 114(H)  BUN 6 - 24 mg/dL 19 14 13   Creatinine 0.57 - 1.00 mg/dL 0.89 0.86 0.62  Sodium 134 - 144 mmol/L 143 142 138  Potassium 3.5 - 5.2 mmol/L 4.5 4.4 3.2(L)  Chloride 96 - 106 mmol/L 105 102 106  CO2 20 - 29 mmol/L 23 26 27   Calcium 8.7 - 10.2 mg/dL 9.4 9.6 8.2(L)  Total Protein 6.0 - 8.5 g/dL 7.2 7.4 6.6  Total Bilirubin 0.0 - 1.2 mg/dL 0.3 0.3 0.5  Alkaline Phos 39 - 117 IU/L 76 68 56  AST 0 - 40 IU/L 19 18 130(H)  ALT 0 - 32 IU/L 22 16 60(H)     Imaging: Xr Foot 2 Views Left  Result Date: 05/29/2018 Left first MTP PIP and DIP narrowing was noted.  None of the other MTP showed any changes, no erosive changes were noted.  No intertarsal or subtalar joint space narrowing was noted.  Calcaneal spur was noted. Impression: These findings are consistent with osteoarthritis of the foot.  Xr Foot 2 Views Right  Result Date: 05/29/2018 Left first MTP PIP and DIP narrowing was noted.  None of the other MTP showed any changes, no erosive changes were noted.  No intertarsal or subtalar joint space narrowing was noted.  Calcaneal spur was noted. Impression: These findings are consistent with osteoarthritis of the foot.  Xr Hand 2 View Left  Result Date: 05/29/2018 Minimal PIP narrowing was noted.  No MCP, intercarpal and radiocarpal joint space narrowing was noted a small spur was noted in the [facility] joint. Impression: These findings are consistent with early osteoarthritis of the hand.  Xr Hand  2 View Right  Result Date: 05/29/2018 Minimal PIP and DIP joint space  narrowing was noted.  No MCP intercarpal radiocarpal joint space narrowing was noted. Impression: These findings are consistent with mild osteoarthritis of the hand.  Xr Knee 3 View Left  Result Date: 05/29/2018 Mild medial compartment narrowing was noted.  No chondrocalcinosis was noted.  Moderate patellofemoral narrowing was noted. 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  Xr Knee 3 View Right  Result Date: 05/29/2018 Mild medial compartment narrowing was noted.  No chondrocalcinosis was noted.  Moderate patellofemoral narrowing was noted. 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   Speciality Comments: No specialty comments available.    Procedures:  No procedures performed Allergies: Patient has no known allergies.   Assessment / Plan:     Visit Diagnoses: Polyarthralgia - ANA 1:160 Speckled, RF-, CCP-, Sed rate 26, CRP 8.6  ANA positive-patient complains of fatigue and photosensitivity.  She also gives history of sicca symptoms.  I will obtain AVISE labs to evaluate this further.  Today on my examination she did not have any photosensitivity, malar rash or synovitis.  Besides AVISE labs I would also obtain labs listed as follows to evaluate for generalized arthralgias and myalgias.  Pain in both hands -she had no synovitis on examination patient and had mild DIP thickening.  Plan: XR Hand 2 View Right, XR Hand 2 View Left showed mild osteoarthritic changes.  Chronic pain of both knees -she has discomfort in her bilateral knee joints without any warmth swelling or effusion.  Plan: XR KNEE 3 VIEW RIGHT, XR KNEE 3 VIEW LEFT.  X-ray showed mild osteoarthritis and moderate chondromalacia patella bilaterally.  Pain in both feet -she complains of pain in her bilateral feet, no synovitis was noted.  Plan: XR Foot 2 Views Right, XR Foot  2 Views Left x-rays reveal osteoarthritic changes.  CFIDS (chronic fatigue and immune dysfunction syndrome) ([facility])-patient reports history of chronic fatigue syndrome.  She has been having fatigue symptoms for more than 18 years.  She also has a history of generalized hyperalgesia and positive tender points for multiple years.  I believe she has underlying fibromyalgia syndrome.  She also gives history of frequency of urination and bloating sensation.  It is possible she may have a component of IBS or IC.  Need for regular exercise was discussed.  Good sleep hygiene was discussed.  History of hypothyroidism  Vitamin D deficiency - Vitamin D 29.6.  She is on vitamin D supplement.  Low vitamin B12 level - Vitamin B12 389  Dyslipidemia     Orders: Orders Placed This Encounter  Procedures  . XR Hand 2 View Right  . XR Hand 2 View Left  . XR KNEE 3 VIEW RIGHT  . XR KNEE 3 VIEW LEFT  . XR Foot 2 Views Right  . XR Foot 2 Views Left  . Urinalysis, Routine w reflex microscopic  . CK  . Serum protein electrophoresis with reflex  . IgG, IgA, IgM  . Pan-ANCA  . Angiotensin converting enzyme   No orders of the defined types were placed in this encounter.   Face-to-face time spent with patient was 50 minutes.> 50% of time was spent in counseling and coordination of care.  Follow-Up Instructions: Return for Positive ANA,CFS, FMS.   Bo Merino, MD  Note - This record has been created using Editor, commissioning.  Chart  creation errors have been sought, but may not always  have been located. Such creation errors do not reflect on  the standard of medical care.

## 2018-05-29 ENCOUNTER — Ambulatory Visit: Payer: Commercial Managed Care - PPO | Admitting: Rheumatology

## 2018-05-29 ENCOUNTER — Ambulatory Visit (INDEPENDENT_AMBULATORY_CARE_PROVIDER_SITE_OTHER): Payer: Self-pay

## 2018-05-29 ENCOUNTER — Encounter: Payer: Self-pay | Admitting: Rheumatology

## 2018-05-29 VITALS — BP 145/71 | HR 66 | Resp 15 | Ht 65.0 in | Wt 178.0 lb

## 2018-05-29 DIAGNOSIS — E559 Vitamin D deficiency, unspecified: Secondary | ICD-10-CM

## 2018-05-29 DIAGNOSIS — D8989 Other specified disorders involving the immune mechanism, not elsewhere classified: Secondary | ICD-10-CM | POA: Diagnosis not present

## 2018-05-29 DIAGNOSIS — R5382 Chronic fatigue, unspecified: Secondary | ICD-10-CM | POA: Diagnosis not present

## 2018-05-29 DIAGNOSIS — R7982 Elevated C-reactive protein (CRP): Secondary | ICD-10-CM

## 2018-05-29 DIAGNOSIS — M79642 Pain in left hand: Secondary | ICD-10-CM | POA: Diagnosis not present

## 2018-05-29 DIAGNOSIS — M79641 Pain in right hand: Secondary | ICD-10-CM

## 2018-05-29 DIAGNOSIS — M25561 Pain in right knee: Secondary | ICD-10-CM | POA: Diagnosis not present

## 2018-05-29 DIAGNOSIS — M25562 Pain in left knee: Secondary | ICD-10-CM

## 2018-05-29 DIAGNOSIS — M79671 Pain in right foot: Secondary | ICD-10-CM

## 2018-05-29 DIAGNOSIS — Z8639 Personal history of other endocrine, nutritional and metabolic disease: Secondary | ICD-10-CM | POA: Diagnosis not present

## 2018-05-29 DIAGNOSIS — E538 Deficiency of other specified B group vitamins: Secondary | ICD-10-CM

## 2018-05-29 DIAGNOSIS — G8929 Other chronic pain: Secondary | ICD-10-CM

## 2018-05-29 DIAGNOSIS — M79672 Pain in left foot: Secondary | ICD-10-CM

## 2018-05-29 DIAGNOSIS — E785 Hyperlipidemia, unspecified: Secondary | ICD-10-CM | POA: Diagnosis not present

## 2018-05-29 DIAGNOSIS — M2550 Pain in unspecified joint: Secondary | ICD-10-CM | POA: Diagnosis not present

## 2018-05-29 DIAGNOSIS — R768 Other specified abnormal immunological findings in serum: Secondary | ICD-10-CM

## 2018-06-01 ENCOUNTER — Encounter: Payer: Self-pay | Admitting: Rheumatology

## 2018-06-01 NOTE — Progress Notes (Signed)
WNLs

## 2018-06-02 LAB — PAN-ANCA
ANCA Proteinase 3: <3.5 U/mL (ref 0.0–3.5)
C-ANCA: <1:20 {titer}

## 2018-06-02 LAB — PROTEIN ELECTROPHORESIS, SERUM, WITH REFLEX
A/G Ratio: 1.3 (ref 0.7–1.7)
ALBUMIN ELP: 4.1 g/dL (ref 2.9–4.4)
ALPHA 2: 0.8 g/dL (ref 0.4–1.0)
Alpha 1: 0.2 g/dL (ref 0.0–0.4)
BETA: 1.1 g/dL (ref 0.7–1.3)
GAMMA GLOBULIN: 1.1 g/dL (ref 0.4–1.8)
Globulin, Total: 3.1 g/dL (ref 2.2–3.9)
Total Protein: 7.2 g/dL (ref 6.0–8.5)

## 2018-06-02 LAB — URINALYSIS, ROUTINE W REFLEX MICROSCOPIC
Bilirubin, UA: NEGATIVE
Glucose, UA: NEGATIVE
Ketones, UA: NEGATIVE
Leukocytes, UA: NEGATIVE
Nitrite, UA: NEGATIVE
PH UA: 5.5 (ref 5.0–7.5)
Protein, UA: NEGATIVE
RBC, UA: NEGATIVE
Specific Gravity, UA: 1.015 (ref 1.005–1.030)
UUROB: 0.2 mg/dL (ref 0.2–1.0)

## 2018-06-02 LAB — IGG, IGA, IGM
IGG (IMMUNOGLOBIN G), SERUM: 946 mg/dL (ref 700–1600)
IGM (IMMUNOGLOBULIN M), SRM: 324 mg/dL — AB (ref 26–217)
IgA/Immunoglobulin A, Serum: 116 mg/dL (ref 87–352)

## 2018-06-02 LAB — ANGIOTENSIN CONVERTING ENZYME: Angio Convert Enzyme: 25 U/L (ref 14–82)

## 2018-06-02 LAB — CK: Total CK: 75 U/L (ref 24–173)

## 2018-06-14 ENCOUNTER — Encounter: Payer: Self-pay | Admitting: Rheumatology

## 2018-06-15 ENCOUNTER — Encounter: Payer: Self-pay | Admitting: Family Medicine

## 2018-06-15 ENCOUNTER — Encounter: Payer: Self-pay | Admitting: Rheumatology

## 2018-06-15 ENCOUNTER — Ambulatory Visit: Payer: Commercial Managed Care - PPO | Admitting: Rheumatology

## 2018-06-15 ENCOUNTER — Ambulatory Visit: Payer: Commercial Managed Care - PPO | Admitting: Family Medicine

## 2018-06-15 VITALS — BP 135/73 | HR 64 | Resp 14 | Ht 65.0 in | Wt 180.0 lb

## 2018-06-15 VITALS — BP 110/82 | HR 78 | Resp 16 | Ht 65.0 in | Wt 177.1 lb

## 2018-06-15 DIAGNOSIS — R768 Other specified abnormal immunological findings in serum: Secondary | ICD-10-CM

## 2018-06-15 DIAGNOSIS — E039 Hypothyroidism, unspecified: Secondary | ICD-10-CM

## 2018-06-15 DIAGNOSIS — M19042 Primary osteoarthritis, left hand: Secondary | ICD-10-CM

## 2018-06-15 DIAGNOSIS — D8989 Other specified disorders involving the immune mechanism, not elsewhere classified: Secondary | ICD-10-CM | POA: Diagnosis not present

## 2018-06-15 DIAGNOSIS — M170 Bilateral primary osteoarthritis of knee: Secondary | ICD-10-CM | POA: Diagnosis not present

## 2018-06-15 DIAGNOSIS — E538 Deficiency of other specified B group vitamins: Secondary | ICD-10-CM | POA: Diagnosis not present

## 2018-06-15 DIAGNOSIS — M797 Fibromyalgia: Secondary | ICD-10-CM | POA: Insufficient documentation

## 2018-06-15 DIAGNOSIS — M19071 Primary osteoarthritis, right ankle and foot: Secondary | ICD-10-CM | POA: Insufficient documentation

## 2018-06-15 DIAGNOSIS — G9332 Myalgic encephalomyelitis/chronic fatigue syndrome: Secondary | ICD-10-CM

## 2018-06-15 DIAGNOSIS — M19041 Primary osteoarthritis, right hand: Secondary | ICD-10-CM

## 2018-06-15 DIAGNOSIS — R5382 Chronic fatigue, unspecified: Secondary | ICD-10-CM | POA: Diagnosis not present

## 2018-06-15 DIAGNOSIS — E785 Hyperlipidemia, unspecified: Secondary | ICD-10-CM | POA: Diagnosis not present

## 2018-06-15 DIAGNOSIS — M19072 Primary osteoarthritis, left ankle and foot: Secondary | ICD-10-CM | POA: Diagnosis not present

## 2018-06-15 DIAGNOSIS — E559 Vitamin D deficiency, unspecified: Secondary | ICD-10-CM

## 2018-06-15 DIAGNOSIS — R7689 Other specified abnormal immunological findings in serum: Secondary | ICD-10-CM | POA: Insufficient documentation

## 2018-06-15 HISTORY — DX: Other specified abnormal immunological findings in serum: R76.8

## 2018-06-15 HISTORY — DX: Fibromyalgia: M79.7

## 2018-06-15 HISTORY — DX: Bilateral primary osteoarthritis of knee: M17.0

## 2018-06-15 HISTORY — DX: Primary osteoarthritis, left hand: M19.041

## 2018-06-15 HISTORY — DX: Primary osteoarthritis, right ankle and foot: M19.071

## 2018-06-15 MED ORDER — DULOXETINE HCL 30 MG PO CPEP: 30.0000 mg | ORAL_CAPSULE | Freq: Every day | ORAL | 0 refills | Status: DC

## 2018-06-15 NOTE — Progress Notes (Signed)
Office Visit Note  Patient: Monica Hardin             Date of Birth: [birth year]           MRN: [id number]             PCP: Steele Sizer, MD Referring: Steele Sizer, MD Visit Date: 06/15/2018 Occupation: @GUAROCC @    Subjective:  Generalized pain.   History of Present Illness: Monica Hardin is a 52 y.o. female returns for follow-up visit today.  She states she continues to have generalized pain and discomfort.  She has been experiencing increased fatigue.  She states the pain is worse now.  She feels her body is very heavy.  She continues to have sicca symptoms.  Activities of Daily Living:  Patient reports morning stiffness for 2 hours.   Patient Reports nocturnal pain.  Difficulty dressing/grooming: Denies Difficulty climbing stairs: Reports Difficulty getting out of chair: Reports Difficulty using hands for taps, buttons, cutlery, and/or writing: Denies   Review of Systems  Constitutional: Positive for fatigue and weight gain. Negative for night sweats and weight loss.  HENT: Positive for mouth dryness. Negative for mouth sores, trouble swallowing, trouble swallowing and nose dryness.   Eyes: Positive for dryness. Negative for pain, redness and visual disturbance.  Respiratory: Negative for cough, shortness of breath and difficulty breathing.   Cardiovascular: Negative for chest pain, palpitations, hypertension, irregular heartbeat and swelling in legs/feet.  Gastrointestinal: Negative for blood in stool, constipation and diarrhea.  Endocrine: Negative for increased urination.  Genitourinary: Negative for vaginal dryness.  Musculoskeletal: Positive for arthralgias, joint pain, myalgias, morning stiffness and myalgias. Negative for joint swelling, muscle weakness and muscle tenderness.  Skin: Negative for color change, rash, hair loss, skin tightness, ulcers and sensitivity to sunlight.  Allergic/Immunologic: Negative for susceptible to infections.  Neurological:  Negative for dizziness, memory loss, night sweats and weakness.  Hematological: Negative for swollen glands.  Psychiatric/Behavioral: Negative for depressed mood and sleep disturbance. The patient is not nervous/anxious.     PMFS History:  Patient Active Problem List   Diagnosis Date Noted  . Primary osteoarthritis of both hands 06/15/2018  . Primary osteoarthritis of both knees 06/15/2018  . Primary osteoarthritis of both feet 06/15/2018  . Positive ANA (antinuclear antibody) 06/15/2018  . Encounter for screening colonoscopy 11/07/2016  . Surgical menopause on hormone replacement therapy 09/01/2016  . Vitamin D deficiency 04/20/2016  . Low vitamin B12 level 04/20/2016  . History of uterine leiomyoma 04/19/2016  . Sciatica, right side 04/19/2016  . Status post TAH-BSO 11/12/2015  . History of ovarian cyst 11/12/2015  . Dyslipidemia 07/04/2015  . Adult hypothyroidism 07/04/2015  . Excess weight 07/04/2015  . Disturbance of skin sensation 07/04/2015  . Lipoma of arm 12/03/2014  . CFIDS (chronic fatigue and immune dysfunction syndrome) ([place]) 05/08/2007    Past Medical History:  Diagnosis Date  . Chronic fatigue   . Hyperlipidemia   . Hypothyroidism   . Overweight   . Primary osteoarthritis of both hands 06/15/2018  . Sciatica, right side   . Thyroid disease     Family History  Problem Relation Age of Onset  . Lung cancer Mother   . Cancer Mother   . Lung cancer Father   . Cancer Father   . Kidney Stones Daughter   . Asthma Son   . Breast cancer Paternal Aunt 75  . Diabetes Paternal Grandmother   . Thyroid disease Sister    Past Surgical History:  Procedure Laterality Date  . ABDOMINAL HYSTERECTOMY N/A 10/05/2015   Procedure: HYSTERECTOMY ABDOMINAL;  Surgeon: [name], MD;  Location: [facility];  Service: Gynecology;  Laterality: N/A;  . CHOLECYSTECTOMY N/A 03/27/2017   Procedure: LAPAROSCOPIC CHOLECYSTECTOMY;  Surgeon: Olean Ree, MD;  Location: [facility];   Service: General;  Laterality: N/A;  . COLONOSCOPY WITH PROPOFOL N/A 11/22/2016   Procedure: COLONOSCOPY WITH PROPOFOL;  Surgeon: Robert Bellow, MD;  Location: [facility];  Service: Endoscopy;  Laterality: N/A;  . FRACTURE SURGERY    . LAPAROSCOPY N/A 08/24/2015   Procedure: LAPAROSCOPY DIAGNOSTIC;  Surgeon: [name], MD;  Location: [facility];  Service: Gynecology;  Laterality: N/A;  . LEEP  [id number]   [facility]  . [place]  . SALPINGOOPHORECTOMY Bilateral 10/05/2015   Procedure: SALPINGO OOPHORECTOMY;  Surgeon: [name], MD;  Location: [facility];  Service: Gynecology;  Laterality: Bilateral;   Social History   Social History Narrative   Two grown children, married   Works at [employer]      Objective: Vital Signs: BP 135/73 (BP Location: Left Arm, Patient Position: Sitting, Cuff Size: Normal)   Pulse 64   Resp 14   Ht 5\' 5"  (1.651 m)   Wt 180 lb (81.6 kg)   LMP 10/01/2015 (Exact Date)   BMI 29.95 kg/m    Physical Exam  Constitutional: She is oriented to person, place, and time. She appears well-developed and well-nourished.  HENT:  Head: Normocephalic and atraumatic.  Eyes: Conjunctivae and EOM are normal.  Neck: Normal range of motion.  Cardiovascular: Normal rate, regular rhythm, normal heart sounds and intact distal pulses.  Pulmonary/Chest: Effort normal and breath sounds normal.  Abdominal: Soft. Bowel sounds are normal.  Lymphadenopathy:    She has no cervical adenopathy.  Neurological: She is alert and oriented to person, place, and time.  Skin: Skin is warm and dry. Capillary refill takes less than 2 seconds.  Psychiatric: She has a normal mood and affect. Her behavior is normal.  Nursing note and vitals reviewed.    Musculoskeletal Exam: C-spine thoracic lumbar spine good range of motion.  Shoulder joints elbow joints wrist joint MCPs PIPs DIPs been good range of motion with no synovitis.  Hip joints knee joints  ankles MTPs PIPs DIPs were in good range of motion with no synovitis.  She has generalized hyperalgesia.  She has positive tender points.  CDAI Exam: No CDAI exam completed.    Investigation: Findings:  May 29, 2018 ANA 1: 320 homogeneous, anticardiolipin IgG 27 (ENA negative, CB CAP negative, beta-2 GP 1-, RF negative, anti-CCP negative, anti-carbamylated protein negative) May 29, 2018 UA negative, SPEP negative, immunoglobulins normal, ANCA negative, ACE negative, CK 75   Imaging: Xr Foot 2 Views Left  Result Date: 05/29/2018 Left first MTP PIP and DIP narrowing was noted.  None of the other MTP showed any changes, no erosive changes were noted.  No intertarsal or subtalar joint space narrowing was noted.  Calcaneal spur was noted. Impression: These findings are consistent with osteoarthritis of the foot.  Xr Foot 2 Views Right  Result Date: 05/29/2018 Left first MTP PIP and DIP narrowing was noted.  None of the other MTP showed any changes, no erosive changes were noted.  No intertarsal or subtalar joint space narrowing was noted.  Calcaneal spur was noted. Impression: These findings are consistent with osteoarthritis of the foot.  Xr Hand 2 View Left  Result Date: 05/29/2018 Minimal PIP narrowing was  noted.  No MCP, intercarpal and radiocarpal joint space narrowing was noted a small spur was noted in the [facility] joint. Impression: These findings are consistent with early osteoarthritis of the hand.  Xr Hand 2 View Right  Result Date: 05/29/2018 Minimal PIP and DIP joint space narrowing was noted.  No MCP intercarpal radiocarpal joint space narrowing was noted. Impression: These findings are consistent with mild osteoarthritis of the hand.  Xr Knee 3 View Left  Result Date: 05/29/2018 Mild medial compartment narrowing was noted.  No chondrocalcinosis was noted.  Moderate patellofemoral narrowing was noted. Impression: These findings are consistent with mild osteoarthritis and moderate  chondromalacia patella of the knee joint.  Xr Knee 3 View Right  Result Date: 05/29/2018 Mild medial compartment narrowing was noted.  No chondrocalcinosis was noted.  Moderate patellofemoral narrowing was noted. 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   Speciality Comments: No specialty comments available.    Procedures:  No procedures performed Allergies: Patient has no known allergies.   Assessment / Plan:     Visit Diagnoses: Primary osteoarthritis of both hands - Bilateral mild.  Patient does not have much clinical osteoarthritis but she has some discomfort in her hands and radiographic features.  Primary osteoarthritis of both knees - Bilateral mild with moderate chondromalacia patella.  She has some discomfort in her knee joints and has difficulty climbing stairs which I believe is mostly due to muscle pain then joint pain.  Primary osteoarthritis of both feet - Bilateral mild.  She continues to have discomfort in her feet.  Positive ANA (antinuclear antibody) - AVISE index -0.8, which indicates that she currently does not have any autoimmune disease.  ANA 1: 320 homogeneous, anticardiolipin IgG 27 which is a low titer.  Patient complains of fatigue, photosensitivity and sicca symptoms.  At this point she does not have active autoimmune disease.  We had detailed discussion regarding that.  She has low titer positive ANA.  Which can be repeated in 1 year.  Chronic fatigue syndrome-she has long-standing history of chronic fatigue syndrome.  Myalgia-patient complains of generalized myalgia and hyperalgesia.  It raises concern about fibromyalgia syndrome.  Detailed counseling fibromyalgia was provided.  I have given her a physical therapy referral to go to integrative therapies for fibromyalgia.  She is disappointed about the diagnosis.  She would like to discuss this further with her PCP.  Sleep hygiene and  regular exercise was emphasized.  Dyslipidemia  Adult hypothyroidism  Low vitamin B12 level  Vitamin D deficiency    Orders: No orders of the defined types were placed in this encounter.  No orders of the defined types were placed in this encounter.   Face-to-face time spent with patient was 30 minutes. >50% of time was spent in counseling and coordination of care.  Follow-Up Instructions: Return if symptoms worsen or fail to improve.   Bo Merino, MD  Note - This record has been created using Editor, commissioning.  Chart creation errors have been sought, but may not always  have been located. Such creation errors do not reflect on  the standard of medical care.

## 2018-06-15 NOTE — Patient Instructions (Addendum)
We will start with Duloxetine, also Lyrica and Nuvigil if needed   Myofascial Pain Syndrome and Fibromyalgia Myofascial pain syndrome and fibromyalgia are both pain disorders. This pain may be felt mainly in your muscles.  Myofascial pain syndrome: ? Always has trigger points or tender points in the muscle that will cause pain when pressed. The pain may come and go. ? Usually affects your neck, upper back, and shoulder areas. The pain often radiates into your arms and hands.  Fibromyalgia: ? Has muscle pains and tenderness that come and go. ? Is often associated with fatigue and sleep disturbances. ? Has trigger points. ? Tends to be long-lasting (chronic), but is not life-threatening.  Fibromyalgia and myofascial pain are not the same. However, they often occur together. If you have both conditions, each can make the other worse. Both are common and can cause enough pain and fatigue to make day-to-day activities difficult. What are the causes? The exact causes of fibromyalgia and myofascial pain are not known. People with certain gene types may be more likely to develop fibromyalgia. Some factors can be triggers for both conditions, such as:  Spine disorders.  Arthritis.  Severe injury (trauma) and other physical stressors.  Being under a lot of stress.  A medical illness.  What are the signs or symptoms? Fibromyalgia The main symptom of fibromyalgia is widespread pain and tenderness in your muscles. This can vary over time. Pain is sometimes described as stabbing, shooting, or burning. You may have tingling or numbness, too. You may also have sleep problems and fatigue. You may wake up feeling tired and groggy (fibro fog). Other symptoms may include:  Bowel and bladder problems.  Headaches.  Visual problems.  Problems with odors and noises.  Depression or mood changes.  Painful menstrual periods (dysmenorrhea).  Dry skin or eyes.  Myofascial pain syndrome Symptoms of  myofascial pain syndrome include:  Tight, ropy bands of muscle.  Uncomfortable sensations in muscular areas, such as: ? Aching. ? Cramping. ? Burning. ? Numbness. ? Tingling. ? Muscle weakness.  Trouble moving certain muscles freely (range of motion).  How is this diagnosed? There are no specific tests to diagnose fibromyalgia or myofascial pain syndrome. Both can be hard to diagnose because their symptoms are common in many other conditions. Your health care provider may suspect one or both of these conditions based on your symptoms and medical history. Your health care provider will also do a physical exam. The key to diagnosing fibromyalgia is having pain, fatigue, and other symptoms for more than three months that cannot be explained by another condition. The key to diagnosing myofascial pain syndrome is finding trigger points in muscles that are tender and cause pain elsewhere in your body (referred pain). How is this treated? Treating fibromyalgia and myofascial pain often requires a team of health care providers. This usually starts with your primary provider and a physical therapist. You may also find it helpful to work with alternative health care providers, such as massage therapists or acupuncturists. Treatment for fibromyalgia may include medicines. This may include nonsteroidal anti-inflammatory drugs (NSAIDs), along with other medicines. Treatment for myofascial pain may also include:  NSAIDs.  Cooling and stretching of muscles.  Trigger point injections.  Sound wave (ultrasound) treatments to stimulate muscles.  Follow these instructions at home:  Take medicines only as directed by your health care provider.  Exercise as directed by your health care provider or physical therapist.  Try to avoid stressful situations.  Practice relaxation techniques  to control your stress. You may want to try: ? Biofeedback. ? Visual imagery. ? Hypnosis. ? Muscle  relaxation. ? Yoga. ? Meditation.  Talk to your health care provider about alternative treatments, such as acupuncture or massage treatment.  Maintain a healthy lifestyle. This includes eating a healthy diet and getting enough sleep.  Consider joining a support group.  Do not do activities that stress or strain your muscles. That includes repetitive motions and heavy lifting. Where to find more information:  National Fibromyalgia Association: www.fmaware.[facility]: www.arthritis.org  American Chronic Pain Association: [employer]EMDeals.dk Contact a health care provider if:  You have new symptoms.  Your symptoms get worse.  You have side effects from your medicines.  You have trouble sleeping.  Your condition is causing depression or anxiety. This information is not intended to replace advice given to you by your health care provider. Make sure you discuss any questions you have with your health care provider. Document Released: 12/12/2005 Document Revised: 05/19/2016 Document Reviewed: 09/17/2014 Elsevier Interactive Patient Education  [employer].

## 2018-06-15 NOTE — Progress Notes (Signed)
Name: Monica Hardin   MRN: [id number]    DOB: 1966-06-20   Date:06/15/2018       Progress Note  Subjective  Chief Complaint  Chief Complaint  Patient presents with  . Results    discuss test results    HPI  FMS/Chronic fatigue syndrome: she was seen by Dr. Nonie Hoyer and diagnosed with FMS , she recommended PT but patient states she cannot afford PT at this time or time off work. She is frustrated because labs are not totally normal. She is feeing discouraged. Dealing with pain and swelling and discomfort for over 10 years, but worse over the past 6 months. More fatigue, legs always achy and heavy, some leg swelling also chronic. Mental fogginess.    Patient Active Problem List   Diagnosis Date Noted  . Primary osteoarthritis of both hands 06/15/2018  . Primary osteoarthritis of both knees 06/15/2018  . Primary osteoarthritis of both feet 06/15/2018  . Positive ANA (antinuclear antibody) 06/15/2018  . Fibromyalgia 06/15/2018  . Encounter for screening colonoscopy 11/07/2016  . Surgical menopause on hormone replacement therapy 09/01/2016  . Vitamin D deficiency 04/20/2016  . Low vitamin B12 level 04/20/2016  . History of uterine leiomyoma 04/19/2016  . Sciatica, right side 04/19/2016  . Status post TAH-BSO 11/12/2015  . History of ovarian cyst 11/12/2015  . Dyslipidemia 07/04/2015  . Adult hypothyroidism 07/04/2015  . Excess weight 07/04/2015  . Disturbance of skin sensation 07/04/2015  . Lipoma of arm 12/03/2014  . CFIDS (chronic fatigue and immune dysfunction syndrome) ([place]) 05/08/2007    Past Surgical History:  Procedure Laterality Date  . ABDOMINAL HYSTERECTOMY N/A 10/05/2015   Procedure: HYSTERECTOMY ABDOMINAL;  Surgeon: [name], MD;  Location: [facility];  Service: Gynecology;  Laterality: N/A;  . CHOLECYSTECTOMY N/A 03/27/2017   Procedure: LAPAROSCOPIC CHOLECYSTECTOMY;  Surgeon: Olean Ree, MD;  Location: [facility];  Service: General;  Laterality: N/A;   . COLONOSCOPY WITH PROPOFOL N/A 11/22/2016   Procedure: COLONOSCOPY WITH PROPOFOL;  Surgeon: Robert Bellow, MD;  Location: [facility];  Service: Endoscopy;  Laterality: N/A;  . FRACTURE SURGERY    . LAPAROSCOPY N/A 08/24/2015   Procedure: LAPAROSCOPY DIAGNOSTIC;  Surgeon: [name], MD;  Location: [facility];  Service: Gynecology;  Laterality: N/A;  . LEEP  [id number]   [facility]  . [place]  . SALPINGOOPHORECTOMY Bilateral 10/05/2015   Procedure: SALPINGO OOPHORECTOMY;  Surgeon: [name], MD;  Location: [facility];  Service: Gynecology;  Laterality: Bilateral;    Family History  Problem Relation Age of Onset  . Lung cancer Mother   . Cancer Mother   . Lung cancer Father   . Cancer Father   . Kidney Stones Daughter   . Asthma Son   . Breast cancer Paternal Aunt 55  . Diabetes Paternal Grandmother   . Thyroid disease Sister     Social History   Socioeconomic History  . Marital status: Married    Spouse name: Marden Noble  . Number of children: 2  . Years of education: Not on file  . Highest education level: Associate degree: academic program  Occupational History  . Occupation: Office manager: [employer]  Social Needs  . Financial resource strain: Not very hard  . Food insecurity:    Worry: Never true    Inability: Never true  . Transportation needs:    Medical: No    Non-medical: No  Tobacco Use  . Smoking status: Never Smoker  .  Smokeless tobacco: Never Used  Substance and Sexual Activity  . Alcohol use: No    Alcohol/week: 0.0 oz  . Drug use: Never  . Sexual activity: Yes    Partners: Male    Birth control/protection: None    Comment: Hysterectomy  Lifestyle  . Physical activity:    Days per week: 3 days    Minutes per session: 10 min  . Stress: Not at all  Relationships  . Social connections:    Talks on phone: More than three times a week    Gets together: Twice a week    Attends religious service: More than 4  times per year    Active member of club or organization: Yes    Attends meetings of clubs or organizations: More than 4 times per year    Relationship status: Married  . Intimate partner violence:    Fear of current or ex partner: No    Emotionally abused: No    Physically abused: No    Forced sexual activity: No  Other Topics Concern  . Not on file  Social History Narrative   Two grown children, married   Works at [employer]      Current Outpatient Medications:  .  estradiol (ESTRACE) 1 MG tablet, TAKE 1 TABLET BY MOUTH ONCE DAILY, Disp: 90 tablet, Rfl: 1 .  levothyroxine (SYNTHROID, LEVOTHROID) 112 MCG tablet, Take 1 tablet (112 mcg total) by mouth daily before breakfast., Disp: 90 tablet, Rfl: 1 .  Vitamin D, Ergocalciferol, (DRISDOL) 50000 units CAPS capsule, TAKE 1 CAPSULE BY MOUTH ONCE A WEEK, Disp: 12 capsule, Rfl: 0  No Known Allergies   ROS  Ten systems reviewed and is negative except as mentioned in HPI    Objective  Vitals:   06/15/18 1449  BP: 110/82  Pulse: 78  Resp: 16  SpO2: 98%  Weight: 177 lb 1.6 oz (80.3 kg)  Height: 5\' 5"  (1.651 m)    Body mass index is 29.47 kg/m.  Physical Exam  Constitutional: Patient appears well-developed and well-nourished. Obese  No distress.  HEENT: head atraumatic, normocephalic, pupils equal and reactive to light, neck supple, throat within normal limits Cardiovascular: Normal rate, regular rhythm and normal heart sounds.  No murmur heard. Non-pitting  BLE edema. Pulmonary/Chest: Effort normal and breath sounds normal. No respiratory distress. Abdominal: Soft.  There is no tenderness. Muscular Skeletal: trigger point positive, but also sore all over her upper back and lower legs Psychiatric: Patient has a normal mood and affect. behavior is normal. Judgment and thought content normal.  Recent Results (from the past 2160 hour(s))  TSH     Status: None   Collection Time: 04/25/18  7:15 AM  Result Value Ref Range   TSH  3.170 0.450 - 4.500 uIU/mL  Celiac Disease Ab Screen w/Rfx     Status: None   Collection Time: 04/25/18  7:15 AM  Result Value Ref Range   Antigliadin Abs, IgA 3 0 - 19 units    Comment:                    Negative                   0 - 19                    Weak Positive             20 - 30  Moderate to Strong Positive   >30    Transglutaminase IgA <2 0 - 3 U/mL    Comment:                               Negative        0 -  3                               Weak Positive   4 - 10                               Positive           >10  Tissue Transglutaminase (tTG) has been identified  as the endomysial antigen.  Studies have demonstr-  ated that endomysial IgA antibodies have over 99%  specificity for gluten sensitive enteropathy.    IgA/Immunoglobulin A, Serum 121 87 - 352 mg/dL  ANA,IFA RA Diag Pnl w/rflx Tit/Patn     Status: Abnormal   Collection Time: 04/25/18  7:15 AM  Result Value Ref Range   ANA Titer 1 Positive (A)     Comment:                                      Negative   <1:80                                      Borderline  1:80                                      Positive   >1:80    Rhuematoid fact SerPl-aCnc <10.0 0.0 - 17.0 IU/mL   Cyclic Citrullin Peptide Ab 5 0 - 19 units    Comment:                           Negative               <20                           Weak positive      20 - 39                           Moderate positive  40 - 59                           Strong positive        >59   Sedimentation rate     Status: None   Collection Time: 04/25/18  7:15 AM  Result Value Ref Range   Sed Rate 26 0 - 40 mm/hr  C-reactive protein     Status: Abnormal   Collection Time: 04/25/18  7:15 AM  Result Value Ref Range   CRP 8.6 (H) 0.0 - 4.9 mg/L  Comprehensive metabolic panel     Status: None   Collection Time: 04/25/18  7:15 AM  Result Value  Ref Range   Glucose 84 65 - 99 mg/dL   BUN 19 6 - 24 mg/dL   Creatinine, Ser 0.89 0.57 - 1.00  mg/dL   GFR calc non Af Amer 75 >59 mL/min/1.73   GFR calc Af Amer 86 >59 mL/min/1.73   BUN/Creatinine Ratio 21 9 - 23   Sodium 143 134 - 144 mmol/L   Potassium 4.5 3.5 - 5.2 mmol/L   Chloride 105 96 - 106 mmol/L   CO2 23 20 - 29 mmol/L   Calcium 9.4 8.7 - 10.2 mg/dL   Total Protein 7.2 6.0 - 8.5 g/dL   Albumin 4.4 3.5 - 5.5 g/dL   Globulin, Total 2.8 1.5 - 4.5 g/dL   Albumin/Globulin Ratio 1.6 1.2 - 2.2   Bilirubin Total 0.3 0.0 - 1.2 mg/dL   Alkaline Phosphatase 76 39 - 117 IU/L   AST 19 0 - 40 IU/L   ALT 22 0 - 32 IU/L  Lipid panel     Status: Abnormal   Collection Time: 04/25/18  7:15 AM  Result Value Ref Range   Cholesterol, Total 238 (H) 100 - 199 mg/dL   Triglycerides 160 (H) 0 - 149 mg/dL   HDL 62 >39 mg/dL   VLDL Cholesterol Cal 32 5 - 40 mg/dL   LDL Calculated 144 (H) 0 - 99 mg/dL   Chol/HDL Ratio 3.8 0.0 - 4.4 ratio    Comment:                                   T. Chol/HDL Ratio                                             Men  Women                               1/2 Avg.Risk  3.4    3.3                                   Avg.Risk  5.0    4.4                                2X Avg.Risk  9.6    7.1                                3X Avg.Risk 23.4   11.0   Hemoglobin A1c     Status: None   Collection Time: 04/25/18  7:15 AM  Result Value Ref Range   Hgb A1c MFr Bld 5.4 4.8 - 5.6 %    Comment:          Prediabetes: 5.7 - 6.4          Diabetes: >6.4          Glycemic control for adults with diabetes: <7.0    Est. average glucose Bld gHb Est-mCnc 108 mg/dL  VITAMIN D 25 Hydroxy (Vit-D Deficiency, Fractures)     Status: Abnormal   Collection Time: 04/25/18  7:15 AM  Result Value Ref Range  Vit D, 25-Hydroxy 29.6 (L) 30.0 - 100.0 ng/mL    Comment: Vitamin D deficiency has been defined by the [place] practice guideline as a level of serum 25-OH vitamin D less than 20 ng/mL (1,2). The Endocrine Society went on to further define  vitamin D insufficiency as a level between 21 and 29 ng/mL (2). 1. IOM (Institute of Medicine). 2010. Dietary reference    intakes for calcium and D. [place]: The    [employer]. 2. Holick MF, Binkley [place], Bischoff-Ferrari HA, et al.    Evaluation, treatment, and prevention of vitamin D    deficiency: an Endocrine Society clinical practice    guideline. JCEM. 2011 Jul; 96(7):1911-30.   Vitamin B12     Status: None   Collection Time: 04/25/18  7:15 AM  Result Value Ref Range   Vitamin B-12 389 232 - 1,245 pg/mL  CBC with Differential/Platelet     Status: None   Collection Time: 04/25/18  7:15 AM  Result Value Ref Range   WBC 6.4 3.4 - 10.8 x10E3/uL   RBC 4.57 3.77 - 5.28 x10E6/uL   Hemoglobin 14.2 11.1 - 15.9 g/dL   Hematocrit 42.9 34.0 - 46.6 %   MCV 94 79 - 97 fL   MCH 31.1 26.6 - 33.0 pg   MCHC 33.1 31.5 - 35.7 g/dL   RDW 13.2 12.3 - 15.4 %   Platelets 310 150 - 379 x10E3/uL   Neutrophils 59 Not Estab. %   Lymphs 34 Not Estab. %   Monocytes 5 Not Estab. %   Eos 2 Not Estab. %   Basos 0 Not Estab. %   Neutrophils Absolute 3.8 1.4 - 7.0 x10E3/uL   Lymphocytes Absolute 2.2 0.7 - 3.1 x10E3/uL   Monocytes Absolute 0.3 0.1 - 0.9 x10E3/uL   EOS (ABSOLUTE) 0.1 0.0 - 0.4 x10E3/uL   Basophils Absolute 0.0 0.0 - 0.2 x10E3/uL   Immature Granulocytes 0 Not Estab. %   Immature Grans (Abs) 0.0 0.0 - 0.1 x10E3/uL  FANA Staining Patterns     Status: Abnormal   Collection Time: 04/25/18  7:15 AM  Result Value Ref Range   Speckled Pattern 1:160 (H)    Note: Comment     Comment: A positive ANA result may occur in healthy individuals (low titer) or be associated with a variety of diseases.  See interpretation chart which is not all inclusive: Pattern      Antigen Detected  Suggested Disease Association -----------  ----------------  ----------------------------- Homogeneous  DNA(ds,ss),       SLE - High titers              Nucleosomes,              Histones           Drug-induced SLE -----------  ----------------  ----------------------------- Speckled     Sm, RNP, SCL-70,  SLE,MCTD,PSS (diffuse form),              SS-A/SS-B         Sjogrens -----------  ----------------  ----------------------------- Nucleolar    SCL-70, PM-1/SCL  High titers Scleroderma,                                PM/DM -----------  ----------------  ----------------------------- Centromere   Centromere        PSS (limited form) w/Crest  syndrome variable -----------  ----------------  ----------------------------- Nuclear Dot  Sp100,p80-coil in  Primary Biliary Cirrhosis -----------  ----------------  ----------------------------- Nuclear      GP210,            Primary Biliary Cirrhosis Membrane     lamin A,B,C -----------  ----------------  -----------------------------   H. pylori breath test     Status: None   Collection Time: 04/25/18  7:24 AM  Result Value Ref Range   H pylori Breath Test Negative Negative  Angiotensin converting enzyme     Status: None   Collection Time: 05/29/18 11:27 AM  Result Value Ref Range   Angio Convert Enzyme 25 14 - 82 U/L  Pan-ANCA     Status: None   Collection Time: 05/29/18 11:27 AM  Result Value Ref Range   Myeloperoxidase Ab <9.0 0.0 - 9.0 U/mL   ANCA Proteinase 3 <3.5 0.0 - 3.5 U/mL   C-ANCA <1:20 Neg:<1:20 titer   P-ANCA <1:20 Neg:<1:20 titer    Comment: The presence of positive fluorescence exhibiting P-ANCA or C-ANCA patterns alone is not specific for the diagnosis of Wegener's Granulomatosis (WG) or microscopic polyangiitis. Decisions about treatment should not be based solely on ANCA IFA results.  The International ANCA Group Consensus recommends follow up testing of positive sera with both PR-3 and MPO-ANCA enzyme immunoassays. As many as 5% serum samples are positive only by EIA. Ref. AM J Clin Pathol 1999;111:507-513.    Atypical pANCA <1:20 Neg:<1:20 titer    Comment: The atypical pANCA  pattern has been observed in a significant percentage of patients with ulcerative colitis, primary sclerosing cholangitis and autoimmune hepatitis.   IgG, IgA, IgM     Status: Abnormal   Collection Time: 05/29/18 11:27 AM  Result Value Ref Range   IgG (Immunoglobin G), Serum 946 700 - 1,600 mg/dL   IgA/Immunoglobulin A, Serum 116 87 - 352 mg/dL   IgM (Immunoglobulin M), Srm 324 (H) 26 - 217 mg/dL  Serum protein electrophoresis with reflex     Status: None   Collection Time: 05/29/18 11:27 AM  Result Value Ref Range   Total Protein 7.2 6.0 - 8.5 g/dL   Albumin ELP 4.1 2.9 - 4.4 g/dL   Alpha 1 0.2 0.0 - 0.4 g/dL   Alpha 2 0.8 0.4 - 1.0 g/dL   Beta 1.1 0.7 - 1.3 g/dL   Gamma Globulin 1.1 0.4 - 1.8 g/dL   M-Spike, % Not Observed Not Observed g/dL   GLOBULIN, TOTAL 3.1 2.2 - 3.9 g/dL   A/G Ratio 1.3 0.7 - 1.7   Please Note: Comment     Comment: Protein electrophoresis scan will follow via computer, mail, or courier delivery.    Interpretation(See Below) Comment     Comment: The SPE pattern appears essentially unremarkable. Evidence of monoclonal protein is not apparent.   CK     Status: None   Collection Time: 05/29/18 11:27 AM  Result Value Ref Range   Total CK 75 24 - 173 U/L  Urinalysis, Routine w reflex microscopic     Status: None   Collection Time: 05/29/18 11:27 AM  Result Value Ref Range   Specific Gravity, UA 1.015 1.005 - 1.030   pH, UA 5.5 5.0 - 7.5   Color, UA Yellow Yellow   Appearance Ur Clear Clear   Leukocytes, UA Negative Negative   Protein, UA Negative Negative/Trace   Glucose, UA Negative Negative   Ketones, UA Negative Negative   RBC, UA Negative Negative  Bilirubin, UA Negative Negative   Urobilinogen, Ur 0.2 0.2 - 1.0 mg/dL   Nitrite, UA Negative Negative   Microscopic Examination Comment     Comment: Microscopic not indicated and not performed.    PHQ2/9: Depression screen [facility] 2/9 05/01/2018 03/21/2017 10/19/2016 04/19/2016 07/07/2015  Decreased  Interest 0 0 0 0 0  Down, Depressed, Hopeless 0 0 0 0 0  PHQ - 2 Score 0 0 0 0 0     Fall Risk: Fall Risk  06/15/2018 05/01/2018 10/31/2017 03/21/2017 10/19/2016  Falls in the past year? No No No No No     Functional Status Survey: Is the patient deaf or have difficulty hearing?: No Does the patient have difficulty seeing, even when wearing glasses/contacts?: No Does the patient have difficulty concentrating, remembering, or making decisions?: No Does the patient have difficulty walking or climbing stairs?: No Does the patient have difficulty dressing or bathing?: No Does the patient have difficulty doing errands alone such as visiting a doctor's office or shopping?: No    Assessment & Plan  1. CFIDS (chronic fatigue and immune dysfunction syndrome) ([place])  Seen by rheumatologist   2. Adult hypothyroidism  She will recheck TSH   3. Fibromyalgia  - DULoxetine (CYMBALTA) 30 MG capsule; Take 1-2 capsules (30-60 mg total) by mouth daily. Take one daily and after the first week take 60 mg daily  Dispense: 60 capsule; Refill: 0   She agrees in trying duloxetine, if does not work or not enough improvement we will try Lyrica and also discussed nuvigil to help with mental fogginess

## 2018-06-15 NOTE — Telephone Encounter (Signed)
Attempted to contact patient and left message for patient to call the office.  

## 2018-06-18 ENCOUNTER — Encounter: Payer: Self-pay | Admitting: Family Medicine

## 2018-06-19 ENCOUNTER — Other Ambulatory Visit: Payer: Self-pay | Admitting: Family Medicine

## 2018-06-19 ENCOUNTER — Encounter: Payer: Self-pay | Admitting: Family Medicine

## 2018-06-19 DIAGNOSIS — E039 Hypothyroidism, unspecified: Secondary | ICD-10-CM

## 2018-06-19 LAB — MICROALBUMIN / CREATININE URINE RATIO
Creatinine, Urine: 135.4 mg/dL
Microalb/Creat Ratio: <2.2 mg/g creat (ref 0.0–30.0)

## 2018-06-19 LAB — TSH: TSH: 4.91 u[IU]/mL — ABNORMAL HIGH (ref 0.450–4.500)

## 2018-06-19 NOTE — Progress Notes (Signed)
t

## 2018-06-19 NOTE — Progress Notes (Signed)
re

## 2018-07-10 ENCOUNTER — Ambulatory Visit: Payer: Self-pay | Admitting: Rheumatology

## 2018-07-12 ENCOUNTER — Other Ambulatory Visit: Payer: Self-pay | Admitting: Family Medicine

## 2018-07-12 DIAGNOSIS — M797 Fibromyalgia: Secondary | ICD-10-CM

## 2018-07-19 ENCOUNTER — Encounter: Payer: Self-pay | Admitting: Family Medicine

## 2018-07-19 ENCOUNTER — Ambulatory Visit (INDEPENDENT_AMBULATORY_CARE_PROVIDER_SITE_OTHER): Payer: Commercial Managed Care - PPO | Admitting: Family Medicine

## 2018-07-19 VITALS — BP 112/70 | HR 88 | Temp 98.2°F | Resp 14 | Ht 65.0 in | Wt 176.6 lb

## 2018-07-19 DIAGNOSIS — E039 Hypothyroidism, unspecified: Secondary | ICD-10-CM

## 2018-07-19 DIAGNOSIS — M797 Fibromyalgia: Secondary | ICD-10-CM

## 2018-07-19 DIAGNOSIS — E559 Vitamin D deficiency, unspecified: Secondary | ICD-10-CM | POA: Diagnosis not present

## 2018-07-19 MED ORDER — VITAMIN D (ERGOCALCIFEROL) 1.25 MG (50000 UNIT) PO CAPS: 50000.0000 [IU] | ORAL_CAPSULE | ORAL | 1 refills | Status: DC

## 2018-07-19 MED ORDER — DULOXETINE HCL 60 MG PO CPEP: 60.0000 mg | ORAL_CAPSULE | Freq: Every day | ORAL | 1 refills | Status: DC

## 2018-07-19 NOTE — Progress Notes (Signed)
Name: Monica Hardin   MRN: [id number]    DOB: [birth year]   Date:07/19/2018       Progress Note  Subjective  Chief Complaint  Chief Complaint  Patient presents with  . Follow-up    1 month F/U  . FMS/Chronic fatigue syndrome    Started Duloxetine for mental fogginess  . Medication Refill    HPI  FMS: she saw Dr. Jefferson Fuel back in 05/2018 and was given formal diagnosis of FMS. She started on Duloxetine and states pain is much better. Down to 1-2/10. She still has some mental fogginess but would like to hold off on any other medications at this time. Did not have PT because it is difficult to take time off work. She still has some joint stiffness. She continues to feel tired but also has chronic fatigue syndrome.   Hypothyroidism: last TSH was high, we continued current regiment and we will recheck in 6 weeks. She has follow up with Endo 08/2018    Patient Active Problem List   Diagnosis Date Noted  . Primary osteoarthritis of both hands 06/15/2018  . Primary osteoarthritis of both knees 06/15/2018  . Primary osteoarthritis of both feet 06/15/2018  . Positive ANA (antinuclear antibody) 06/15/2018  . Fibromyalgia 06/15/2018  . Encounter for screening colonoscopy 11/07/2016  . Surgical menopause on hormone replacement therapy 09/01/2016  . Vitamin D deficiency 04/20/2016  . Low vitamin B12 level 04/20/2016  . History of uterine leiomyoma 04/19/2016  . Sciatica, right side 04/19/2016  . Status post TAH-BSO 11/12/2015  . History of ovarian cyst 11/12/2015  . Dyslipidemia 07/04/2015  . Adult hypothyroidism 07/04/2015  . Excess weight 07/04/2015  . Disturbance of skin sensation 07/04/2015  . Lipoma of arm 12/03/2014  . CFIDS (chronic fatigue and immune dysfunction syndrome) ([place]) 05/08/2007    Past Surgical History:  Procedure Laterality Date  . ABDOMINAL HYSTERECTOMY N/A 10/05/2015   Procedure: HYSTERECTOMY ABDOMINAL;  Surgeon: [name], MD;  Location: [facility];   Service: Gynecology;  Laterality: N/A;  . CHOLECYSTECTOMY N/A 03/27/2017   Procedure: LAPAROSCOPIC CHOLECYSTECTOMY;  Surgeon: Olean Ree, MD;  Location: [facility];  Service: General;  Laterality: N/A;  . COLONOSCOPY WITH PROPOFOL N/A 11/22/2016   Procedure: COLONOSCOPY WITH PROPOFOL;  Surgeon: Robert Bellow, MD;  Location: [facility];  Service: Endoscopy;  Laterality: N/A;  . FRACTURE SURGERY    . LAPAROSCOPY N/A 08/24/2015   Procedure: LAPAROSCOPY DIAGNOSTIC;  Surgeon: [name], MD;  Location: [facility];  Service: Gynecology;  Laterality: N/A;  . LEEP  [id number]   [facility]  . [place]  . SALPINGOOPHORECTOMY Bilateral 10/05/2015   Procedure: SALPINGO OOPHORECTOMY;  Surgeon: [name], MD;  Location: [facility];  Service: Gynecology;  Laterality: Bilateral;    Family History  Problem Relation Age of Onset  . Lung cancer Mother   . Cancer Mother   . Lung cancer Father   . Cancer Father   . Kidney Stones Daughter   . Asthma Son   . Breast cancer Paternal Aunt 49  . Diabetes Paternal Grandmother   . Thyroid disease Sister     Social History   Socioeconomic History  . Marital status: Married    Spouse name: Marden Noble  . Number of children: 2  . Years of education: Not on file  . Highest education level: Associate degree: academic program  Occupational History  . Occupation: Office manager: [employer]  Social Needs  . Emergency planning/management officer  strain: Not very hard  . Food insecurity:    Worry: Never true    Inability: Never true  . Transportation needs:    Medical: No    Non-medical: No  Tobacco Use  . Smoking status: Never Smoker  . Smokeless tobacco: Never Used  Substance and Sexual Activity  . Alcohol use: No    Alcohol/week: 0.0 oz  . Drug use: Never  . Sexual activity: Yes    Partners: Male    Birth control/protection: None    Comment: Hysterectomy  Lifestyle  . Physical activity:    Days per week: 3 days    Minutes per  session: 10 min  . Stress: Not at all  Relationships  . Social connections:    Talks on phone: More than three times a week    Gets together: Twice a week    Attends religious service: More than 4 times per year    Active member of club or organization: Yes    Attends meetings of clubs or organizations: More than 4 times per year    Relationship status: Married  . Intimate partner violence:    Fear of current or ex partner: No    Emotionally abused: No    Physically abused: No    Forced sexual activity: No  Other Topics Concern  . Not on file  Social History Narrative   Two grown children, married   Works at [employer]      Current Outpatient Medications:  .  DULoxetine (CYMBALTA) 60 MG capsule, Take 1 capsule (60 mg total) by mouth daily., Disp: 90 capsule, Rfl: 1 .  estradiol (ESTRACE) 1 MG tablet, TAKE 1 TABLET BY MOUTH ONCE DAILY, Disp: 90 tablet, Rfl: 1 .  levothyroxine (SYNTHROID, LEVOTHROID) 112 MCG tablet, Take 1 tablet (112 mcg total) by mouth daily before breakfast., Disp: 90 tablet, Rfl: 1 .  Vitamin D, Ergocalciferol, (DRISDOL) 50000 units CAPS capsule, Take 1 capsule (50,000 Units total) by mouth once a week., Disp: 12 capsule, Rfl: 1  No Known Allergies   ROS  Ten systems reviewed and is negative except as mentioned in HPI   Objective  Vitals:   07/19/18 1132  BP: 112/70  Pulse: 88  Resp: 14  Temp: 98.2 F (36.8 C)  TempSrc: Oral  SpO2: 98%  Weight: 176 lb 9.6 oz (80.1 kg)  Height: 5\' 5"  (1.651 m)    Body mass index is 29.39 kg/m.  Physical Exam  Constitutional: Patient appears well-developed and well-nourished. Overweight. No distress.  HEENT: head atraumatic, normocephalic, pupils equal and reactive to light,  neck supple, throat within normal limits Cardiovascular: Normal rate, regular rhythm and normal heart sounds.  No murmur heard. No BLE edema. Pulmonary/Chest: Effort normal and breath sounds normal. No respiratory distress. Abdominal:  Soft.  There is no tenderness. Psychiatric: Patient has a normal mood and affect. behavior is normal. Judgment and thought content normal. Muscular Skeletal: trigger point positive   Recent Results (from the past 2160 hour(s))  TSH     Status: None   Collection Time: 04/25/18  7:15 AM  Result Value Ref Range   TSH 3.170 0.450 - 4.500 uIU/mL  Celiac Disease Ab Screen w/Rfx     Status: None   Collection Time: 04/25/18  7:15 AM  Result Value Ref Range   Antigliadin Abs, IgA 3 0 - 19 units    Comment:  Negative                   0 - 19                    Weak Positive             20 - 30                    Moderate to Strong Positive   >30    Transglutaminase IgA <2 0 - 3 U/mL    Comment:                               Negative        0 -  3                               Weak Positive   4 - 10                               Positive           >10  Tissue Transglutaminase (tTG) has been identified  as the endomysial antigen.  Studies have demonstr-  ated that endomysial IgA antibodies have over 99%  specificity for gluten sensitive enteropathy.    IgA/Immunoglobulin A, Serum 121 87 - 352 mg/dL  ANA,IFA RA Diag Pnl w/rflx Tit/Patn     Status: Abnormal   Collection Time: 04/25/18  7:15 AM  Result Value Ref Range   ANA Titer 1 Positive (A)     Comment:                                      Negative   <1:80                                      Borderline  1:80                                      Positive   >1:80    Rhuematoid fact SerPl-aCnc <10.0 0.0 - 69.6 IU/mL   Cyclic Citrullin Peptide Ab 5 0 - 19 units    Comment:                           Negative               <20                           Weak positive      20 - 39                           Moderate positive  40 - 59                           Strong positive        >59   Sedimentation rate  Status: None   Collection Time: 04/25/18  7:15 AM  Result Value Ref Range   Sed Rate 26 0 - 40 mm/hr  C-reactive  protein     Status: Abnormal   Collection Time: 04/25/18  7:15 AM  Result Value Ref Range   CRP 8.6 (H) 0.0 - 4.9 mg/L  Comprehensive metabolic panel     Status: None   Collection Time: 04/25/18  7:15 AM  Result Value Ref Range   Glucose 84 65 - 99 mg/dL   BUN 19 6 - 24 mg/dL   Creatinine, Ser 0.89 0.57 - 1.00 mg/dL   GFR calc non Af Amer 75 >59 mL/min/1.73   GFR calc Af Amer 86 >59 mL/min/1.73   BUN/Creatinine Ratio 21 9 - 23   Sodium 143 134 - 144 mmol/L   Potassium 4.5 3.5 - 5.2 mmol/L   Chloride 105 96 - 106 mmol/L   CO2 23 20 - 29 mmol/L   Calcium 9.4 8.7 - 10.2 mg/dL   Total Protein 7.2 6.0 - 8.5 g/dL   Albumin 4.4 3.5 - 5.5 g/dL   Globulin, Total 2.8 1.5 - 4.5 g/dL   Albumin/Globulin Ratio 1.6 1.2 - 2.2   Bilirubin Total 0.3 0.0 - 1.2 mg/dL   Alkaline Phosphatase 76 39 - 117 IU/L   AST 19 0 - 40 IU/L   ALT 22 0 - 32 IU/L  Lipid panel     Status: Abnormal   Collection Time: 04/25/18  7:15 AM  Result Value Ref Range   Cholesterol, Total 238 (H) 100 - 199 mg/dL   Triglycerides 160 (H) 0 - 149 mg/dL   HDL 62 >39 mg/dL   VLDL Cholesterol Cal 32 5 - 40 mg/dL   LDL Calculated 144 (H) 0 - 99 mg/dL   Chol/HDL Ratio 3.8 0.0 - 4.4 ratio    Comment:                                   T. Chol/HDL Ratio                                             Men  Women                               1/2 Avg.Risk  3.4    3.3                                   Avg.Risk  5.0    4.4                                2X Avg.Risk  9.6    7.1                                3X Avg.Risk 23.4   11.0   Hemoglobin A1c     Status: None   Collection Time: 04/25/18  7:15 AM  Result Value Ref Range   Hgb A1c MFr Bld 5.4 4.8 - 5.6 %    Comment:  Prediabetes: 5.7 - 6.4          Diabetes: >6.4          Glycemic control for adults with diabetes: <7.0    Est. average glucose Bld gHb Est-mCnc 108 mg/dL  VITAMIN D 25 Hydroxy (Vit-D Deficiency, Fractures)     Status: Abnormal   Collection Time: 04/25/18   7:15 AM  Result Value Ref Range   Vit D, 25-Hydroxy 29.6 (L) 30.0 - 100.0 ng/mL    Comment: Vitamin D deficiency has been defined by the [place] practice guideline as a level of serum 25-OH vitamin D less than 20 ng/mL (1,2). The Endocrine Society went on to further define vitamin D insufficiency as a level between 21 and 29 ng/mL (2). 1. IOM (Institute of Medicine). 2010. Dietary reference    intakes for calcium and D. [place]: The    [employer]. 2. Holick MF, Binkley [place], Bischoff-Ferrari HA, et al.    Evaluation, treatment, and prevention of vitamin D    deficiency: an Endocrine Society clinical practice    guideline. JCEM. 2011 Jul; 96(7):1911-30.   Vitamin B12     Status: None   Collection Time: 04/25/18  7:15 AM  Result Value Ref Range   Vitamin B-12 389 232 - 1,245 pg/mL  CBC with Differential/Platelet     Status: None   Collection Time: 04/25/18  7:15 AM  Result Value Ref Range   WBC 6.4 3.4 - 10.8 x10E3/uL   RBC 4.57 3.77 - 5.28 x10E6/uL   Hemoglobin 14.2 11.1 - 15.9 g/dL   Hematocrit 42.9 34.0 - 46.6 %   MCV 94 79 - 97 fL   MCH 31.1 26.6 - 33.0 pg   MCHC 33.1 31.5 - 35.7 g/dL   RDW 13.2 12.3 - 15.4 %   Platelets 310 150 - 379 x10E3/uL   Neutrophils 59 Not Estab. %   Lymphs 34 Not Estab. %   Monocytes 5 Not Estab. %   Eos 2 Not Estab. %   Basos 0 Not Estab. %   Neutrophils Absolute 3.8 1.4 - 7.0 x10E3/uL   Lymphocytes Absolute 2.2 0.7 - 3.1 x10E3/uL   Monocytes Absolute 0.3 0.1 - 0.9 x10E3/uL   EOS (ABSOLUTE) 0.1 0.0 - 0.4 x10E3/uL   Basophils Absolute 0.0 0.0 - 0.2 x10E3/uL   Immature Granulocytes 0 Not Estab. %   Immature Grans (Abs) 0.0 0.0 - 0.1 x10E3/uL  FANA Staining Patterns     Status: Abnormal   Collection Time: 04/25/18  7:15 AM  Result Value Ref Range   Speckled Pattern 1:160 (H)    Note: Comment     Comment: A positive ANA result may occur in healthy individuals (low titer) or be  associated with a variety of diseases.  See interpretation chart which is not all inclusive: Pattern      Antigen Detected  Suggested Disease Association -----------  ----------------  ----------------------------- Homogeneous  DNA(ds,ss),       SLE - High titers              Nucleosomes,              Histones          Drug-induced SLE -----------  ----------------  ----------------------------- Speckled     Sm, RNP, SCL-70,  SLE,MCTD,PSS (diffuse form),              SS-A/SS-B         Sjogrens -----------  ----------------  -----------------------------  Nucleolar    SCL-70, PM-1/SCL  High titers Scleroderma,                                PM/DM -----------  ----------------  ----------------------------- Centromere   Centromere        PSS (limited form) w/Crest                                syndrome variable -----------  ----------------  ----------------------------- Nuclear Dot  Sp100,p80-coil in  Primary Biliary Cirrhosis -----------  ----------------  ----------------------------- Nuclear      GP210,            Primary Biliary Cirrhosis Membrane     lamin A,B,C -----------  ----------------  -----------------------------   H. pylori breath test     Status: None   Collection Time: 04/25/18  7:24 AM  Result Value Ref Range   H pylori Breath Test Negative Negative  Angiotensin converting enzyme     Status: None   Collection Time: 05/29/18 11:27 AM  Result Value Ref Range   Angio Convert Enzyme 25 14 - 82 U/L  Pan-ANCA     Status: None   Collection Time: 05/29/18 11:27 AM  Result Value Ref Range   Myeloperoxidase Ab <9.0 0.0 - 9.0 U/mL   ANCA Proteinase 3 <3.5 0.0 - 3.5 U/mL   C-ANCA <1:20 Neg:<1:20 titer   P-ANCA <1:20 Neg:<1:20 titer    Comment: The presence of positive fluorescence exhibiting P-ANCA or C-ANCA patterns alone is not specific for the diagnosis of Wegener's Granulomatosis (WG) or microscopic polyangiitis. Decisions about treatment should not be based solely  on ANCA IFA results.  The International ANCA Group Consensus recommends follow up testing of positive sera with both PR-3 and MPO-ANCA enzyme immunoassays. As many as 5% serum samples are positive only by EIA. Ref. AM J Clin Pathol 1999;111:507-513.    Atypical pANCA <1:20 Neg:<1:20 titer    Comment: The atypical pANCA pattern has been observed in a significant percentage of patients with ulcerative colitis, primary sclerosing cholangitis and autoimmune hepatitis.   IgG, IgA, IgM     Status: Abnormal   Collection Time: 05/29/18 11:27 AM  Result Value Ref Range   IgG (Immunoglobin G), Serum 946 700 - 1,600 mg/dL   IgA/Immunoglobulin A, Serum 116 87 - 352 mg/dL   IgM (Immunoglobulin M), Srm 324 (H) 26 - 217 mg/dL  Serum protein electrophoresis with reflex     Status: None   Collection Time: 05/29/18 11:27 AM  Result Value Ref Range   Total Protein 7.2 6.0 - 8.5 g/dL   Albumin ELP 4.1 2.9 - 4.4 g/dL   Alpha 1 0.2 0.0 - 0.4 g/dL   Alpha 2 0.8 0.4 - 1.0 g/dL   Beta 1.1 0.7 - 1.3 g/dL   Gamma Globulin 1.1 0.4 - 1.8 g/dL   M-Spike, % Not Observed Not Observed g/dL   GLOBULIN, TOTAL 3.1 2.2 - 3.9 g/dL   A/G Ratio 1.3 0.7 - 1.7   Please Note: Comment     Comment: Protein electrophoresis scan will follow via computer, mail, or courier delivery.    Interpretation(See Below) Comment     Comment: The SPE pattern appears essentially unremarkable. Evidence of monoclonal protein is not apparent.   CK     Status: None   Collection Time: 05/29/18 11:27 AM  Result Value Ref Range  Total CK 75 24 - 173 U/L  Urinalysis, Routine w reflex microscopic     Status: None   Collection Time: 05/29/18 11:27 AM  Result Value Ref Range   Specific Gravity, UA 1.015 1.005 - 1.030   pH, UA 5.5 5.0 - 7.5   Color, UA Yellow Yellow   Appearance Ur Clear Clear   Leukocytes, UA Negative Negative   Protein, UA Negative Negative/Trace   Glucose, UA Negative Negative   Ketones, UA Negative Negative   RBC,  UA Negative Negative   Bilirubin, UA Negative Negative   Urobilinogen, Ur 0.2 0.2 - 1.0 mg/dL   Nitrite, UA Negative Negative   Microscopic Examination Comment     Comment: Microscopic not indicated and not performed.  Urine Microalbumin w/creat. ratio     Status: None   Collection Time: 06/18/18  7:20 AM  Result Value Ref Range   Creatinine, Urine 135.4 Not Estab. mg/dL   Microalbumin, Urine <3.0 Not Estab. ug/mL   Microalb/Creat Ratio <2.2 0.0 - 30.0 mg/g creat    Comment:                      Normal:                0.0 -  30.0                      Albuminuria:          31.0 - 300.0                      Clinical albuminuria:       >300.0   TSH     Status: Abnormal   Collection Time: 06/18/18  7:21 AM  Result Value Ref Range   TSH 4.910 (H) 0.450 - 4.500 uIU/mL      PHQ2/9: Depression screen [facility] 2/9 07/19/2018 05/01/2018 03/21/2017 10/19/2016 04/19/2016  Decreased Interest 0 0 0 0 0  Down, Depressed, Hopeless 0 0 0 0 0  PHQ - 2 Score 0 0 0 0 0  Altered sleeping 0 - - - -  Tired, decreased energy 1 - - - -  Change in appetite 0 - - - -  Feeling bad or failure about yourself  0 - - - -  Trouble concentrating 0 - - - -  Moving slowly or fidgety/restless 0 - - - -  Suicidal thoughts 0 - - - -  PHQ-9 Score 1 - - - -     Fall Risk: Fall Risk  07/19/2018 06/15/2018 05/01/2018 10/31/2017 03/21/2017  Falls in the past year? No No No No No     Functional Status Survey: Is the patient deaf or have difficulty hearing?: No Does the patient have difficulty seeing, even when wearing glasses/contacts?: No Does the patient have difficulty concentrating, remembering, or making decisions?: No Does the patient have difficulty walking or climbing stairs?: No Does the patient have difficulty dressing or bathing?: No Does the patient have difficulty doing errands alone such as visiting a doctor's office or shopping?: No    Assessment & Plan  1. Fibromyalgia  - DULoxetine (CYMBALTA) 60 MG  capsule; Take 1 capsule (60 mg total) by mouth daily.  Dispense: 90 capsule; Refill: 1  2. Adult hypothyroidism  She will recheck TSH  3. Vitamin D deficiency  - Vitamin D, Ergocalciferol, (DRISDOL) 50000 units CAPS capsule; Take 1 capsule (50,000 Units total) by mouth once a week.  Dispense: 12 capsule; Refill: 1

## 2018-07-26 LAB — TSH: TSH: 4.26 u[IU]/mL (ref 0.450–4.500)

## 2018-08-17 ENCOUNTER — Other Ambulatory Visit: Payer: Self-pay | Admitting: Obstetrics and Gynecology

## 2018-08-17 DIAGNOSIS — R232 Flushing: Secondary | ICD-10-CM

## 2018-09-05 ENCOUNTER — Other Ambulatory Visit: Payer: Self-pay | Admitting: Obstetrics and Gynecology

## 2018-09-05 DIAGNOSIS — R232 Flushing: Secondary | ICD-10-CM

## 2018-09-07 ENCOUNTER — Other Ambulatory Visit: Payer: Self-pay | Admitting: Family Medicine

## 2018-09-07 ENCOUNTER — Encounter: Payer: Self-pay | Admitting: Family Medicine

## 2018-09-07 DIAGNOSIS — R232 Flushing: Secondary | ICD-10-CM

## 2018-09-07 MED ORDER — ESTRADIOL 1 MG PO TABS: 1.0000 mg | ORAL_TABLET | Freq: Every day | ORAL | 1 refills | Status: DC

## 2018-09-20 ENCOUNTER — Encounter: Payer: Self-pay | Admitting: Family Medicine

## 2018-09-20 ENCOUNTER — Other Ambulatory Visit: Payer: Self-pay | Admitting: Family Medicine

## 2018-09-20 ENCOUNTER — Other Ambulatory Visit: Payer: Self-pay

## 2018-09-20 DIAGNOSIS — R768 Other specified abnormal immunological findings in serum: Secondary | ICD-10-CM

## 2018-09-20 DIAGNOSIS — R7982 Elevated C-reactive protein (CRP): Secondary | ICD-10-CM

## 2018-09-20 DIAGNOSIS — M2550 Pain in unspecified joint: Secondary | ICD-10-CM

## 2018-10-24 ENCOUNTER — Encounter: Payer: Self-pay | Admitting: Family Medicine

## 2018-10-24 LAB — IGG, IGA, IGM
IGG (IMMUNOGLOBIN G), SERUM: 772 mg/dL (ref 700–1600)
IgA/Immunoglobulin A, Serum: 101 mg/dL (ref 87–352)
IgM (Immunoglobulin M), Srm: 265 mg/dL — ABNORMAL HIGH (ref 26–217)

## 2018-10-24 LAB — C-REACTIVE PROTEIN: CRP: 10 mg/L (ref 0–10)

## 2018-10-24 LAB — ANA W/REFLEX IF POSITIVE: Anti Nuclear Antibody(ANA): NEGATIVE

## 2018-10-24 LAB — SEDIMENTATION RATE: SED RATE: 25 mm/h (ref 0–40)

## 2018-11-01 ENCOUNTER — Ambulatory Visit: Payer: Commercial Managed Care - PPO | Admitting: Family Medicine

## 2018-11-13 ENCOUNTER — Other Ambulatory Visit: Payer: Self-pay | Admitting: Family Medicine

## 2018-11-13 DIAGNOSIS — Z1231 Encounter for screening mammogram for malignant neoplasm of breast: Secondary | ICD-10-CM

## 2018-11-14 ENCOUNTER — Other Ambulatory Visit: Payer: Self-pay | Admitting: Family Medicine

## 2018-11-14 DIAGNOSIS — E039 Hypothyroidism, unspecified: Secondary | ICD-10-CM

## 2018-11-18 ENCOUNTER — Encounter: Payer: Self-pay | Admitting: Family Medicine

## 2018-11-19 ENCOUNTER — Other Ambulatory Visit (HOSPITAL_COMMUNITY)
Admission: RE | Admit: 2018-11-19 | Discharge: 2018-11-19 | Disposition: A | Discharge status: Home / Self Care | Payer: Commercial Managed Care - PPO | Source: Ambulatory Visit | Attending: Nurse Practitioner | Admitting: Nurse Practitioner

## 2018-11-19 ENCOUNTER — Ambulatory Visit
Admission: RE | Admit: 2018-11-19 | Discharge: 2018-11-19 | Disposition: A | Discharge status: Home / Self Care | Payer: Commercial Managed Care - PPO | Source: Ambulatory Visit | Attending: Nurse Practitioner | Admitting: Nurse Practitioner

## 2018-11-19 ENCOUNTER — Ambulatory Visit (INDEPENDENT_AMBULATORY_CARE_PROVIDER_SITE_OTHER): Payer: Commercial Managed Care - PPO | Admitting: Nurse Practitioner

## 2018-11-19 ENCOUNTER — Encounter: Payer: Self-pay | Admitting: Nurse Practitioner

## 2018-11-19 VITALS — BP 122/84 | HR 90 | Temp 97.8°F | Resp 14 | Ht 65.0 in | Wt 175.5 lb

## 2018-11-19 DIAGNOSIS — N950 Postmenopausal bleeding: Secondary | ICD-10-CM

## 2018-11-19 DIAGNOSIS — R1031 Right lower quadrant pain: Secondary | ICD-10-CM

## 2018-11-19 DIAGNOSIS — N898 Other specified noninflammatory disorders of vagina: Secondary | ICD-10-CM | POA: Diagnosis not present

## 2018-11-19 DIAGNOSIS — K5900 Constipation, unspecified: Secondary | ICD-10-CM | POA: Diagnosis not present

## 2018-11-19 LAB — HEMOCCULT GUIAC POC 1CARD (OFFICE): Fecal Occult Blood, POC: NEGATIVE

## 2018-11-19 LAB — POCT URINALYSIS DIPSTICK
BILIRUBIN UA: NEGATIVE
Blood, UA: NEGATIVE
Glucose, UA: NEGATIVE
KETONES UA: NEGATIVE
Leukocytes, UA: NEGATIVE
Nitrite, UA: NEGATIVE
PH UA: 6 (ref 5.0–8.0)
Protein, UA: NEGATIVE
Spec Grav, UA: 1.01 (ref 1.010–1.025)
UROBILINOGEN UA: NEGATIVE U/dL — AB

## 2018-11-19 MED ORDER — IOPAMIDOL (ISOVUE-300) INJECTION 61%
100.0000 mL | Freq: Once | INTRAVENOUS | Status: AC | PRN
  Administered 2018-11-19: 100 mL via INTRAVENOUS

## 2018-11-19 NOTE — Patient Instructions (Signed)
Always:  - Increase water intake - Increase activity as tolerated.  _______________ Can Do always or sometimes:   - Metamucil Powder; 1 teaspon start once a day for 3 days then twice a day for 3 days then can go up to 3 times a day if needed.  - Take docusate sodium 100 mg twice a day  ________  Rarely just as needed; Please follow direction on box:   - Take Polyethylene glycol 17 grams per day  OR  - Sennosides 8.6 mg: 2 tablets orally once a day at bedtime   _______ If unable to have Bowel movement, having abdominal pain, or nausea and vomiting please get medical assistance immediately.  

## 2018-11-19 NOTE — Progress Notes (Signed)
Name: Monica Hardin   MRN: [id number]    DOB: [birth year]   Date:11/19/2018       Progress Note  Subjective  Chief Complaint  Chief Complaint  Patient presents with  . Vaginal Bleeding    for 3 days, frequent changing in panty liner for 2 days. Hysterectomy in 2016.  . Abdominal Pain    right side  . Back Pain    HPI  Patient endorses right flank pain that started on Friday starts RLQ and shoots around to back. Patients pain feels similar to when she had fibroids that she had hysterectomy. Atates Saturday morning was having consistent bleeding- seemed to be vaginal, states had to change panty liner every few hours. States bleeding stopped around lunch time today. Patient endorses pain with intercourse that has been worse the past few weeks, last intercourse was over a week ago due to pain. Has had constipation this week- is usually very regular- every day or every other day. Last BM was maybe last Wednesday- with straining.   CT scan on 03/26/2017- showed uterus surgically absent with no visualization of ovaries.   Patient Active Problem List   Diagnosis Date Noted  . Primary osteoarthritis of both hands 06/15/2018  . Primary osteoarthritis of both knees 06/15/2018  . Primary osteoarthritis of both feet 06/15/2018  . Positive ANA (antinuclear antibody) 06/15/2018  . Fibromyalgia 06/15/2018  . Encounter for screening colonoscopy 11/07/2016  . Surgical menopause on hormone replacement therapy 09/01/2016  . Vitamin D deficiency 04/20/2016  . Low vitamin B12 level 04/20/2016  . History of uterine leiomyoma 04/19/2016  . Sciatica, right side 04/19/2016  . Status post TAH-BSO 11/12/2015  . History of ovarian cyst 11/12/2015  . Dyslipidemia 07/04/2015  . Adult hypothyroidism 07/04/2015  . Excess weight 07/04/2015  . Disturbance of skin sensation 07/04/2015  . Lipoma of arm 12/03/2014  . CFIDS (chronic fatigue and immune dysfunction syndrome) ([place]) 05/08/2007    Past Medical  History:  Diagnosis Date  . Chronic fatigue   . Hyperlipidemia   . Hypothyroidism   . Overweight   . Primary osteoarthritis of both hands 06/15/2018  . Sciatica, right side   . Thyroid disease     Past Surgical History:  Procedure Laterality Date  . ABDOMINAL HYSTERECTOMY N/A 10/05/2015   Procedure: HYSTERECTOMY ABDOMINAL;  Surgeon: [name], MD;  Location: [facility];  Service: Gynecology;  Laterality: N/A;  . CHOLECYSTECTOMY N/A 03/27/2017   Procedure: LAPAROSCOPIC CHOLECYSTECTOMY;  Surgeon: Olean Ree, MD;  Location: [facility];  Service: General;  Laterality: N/A;  . COLONOSCOPY WITH PROPOFOL N/A 11/22/2016   Procedure: COLONOSCOPY WITH PROPOFOL;  Surgeon: Robert Bellow, MD;  Location: [facility];  Service: Endoscopy;  Laterality: N/A;  . FRACTURE SURGERY    . LAPAROSCOPY N/A 08/24/2015   Procedure: LAPAROSCOPY DIAGNOSTIC;  Surgeon: [name], MD;  Location: [facility];  Service: Gynecology;  Laterality: N/A;  . LEEP  [id number]   [facility]  . [place]  . SALPINGOOPHORECTOMY Bilateral 10/05/2015   Procedure: SALPINGO OOPHORECTOMY;  Surgeon: [name], MD;  Location: [facility];  Service: Gynecology;  Laterality: Bilateral;    Social History   Tobacco Use  . Smoking status: Never Smoker  . Smokeless tobacco: Never Used  Substance Use Topics  . Alcohol use: No    Alcohol/week: 0.0 standard drinks     Current Outpatient Medications:  .  DULoxetine (CYMBALTA) 60 MG capsule, Take 1 capsule (60 mg total) by  mouth daily., Disp: 90 capsule, Rfl: 1 .  estradiol (ESTRACE) 1 MG tablet, Take 1 tablet (1 mg total) by mouth daily., Disp: 90 tablet, Rfl: 1 .  levothyroxine (SYNTHROID, LEVOTHROID) 112 MCG tablet, Take 1 tablet (112 mcg total) by mouth daily before breakfast., Disp: 90 tablet, Rfl: 1 .  Vitamin D, Ergocalciferol, (DRISDOL) 50000 units CAPS capsule, Take 1 capsule (50,000 Units total) by mouth once a week., Disp: 12  capsule, Rfl: 1  No Known Allergies  Review of Systems  Constitutional: Positive for malaise/fatigue (chronic). Negative for chills, diaphoresis, fever and weight loss.  Respiratory: Negative for cough and shortness of breath.   Cardiovascular: Negative for chest pain.  Gastrointestinal: Positive for abdominal pain and constipation. Negative for diarrhea, heartburn, nausea and vomiting.  Genitourinary: Positive for flank pain, frequency and urgency. Negative for dysuria.  Musculoskeletal: Positive for back pain and myalgias (chronic).  Skin: Negative for rash.  Neurological: Negative for dizziness, tingling, sensory change, focal weakness and headaches.     No other specific complaints in a complete review of systems (except as listed in HPI above).  Objective  Vitals:   11/19/18 1437  BP: 122/84  Pulse: 90  Resp: 14  Temp: 97.8 F (36.6 C)  TempSrc: Oral  SpO2: 99%  Weight: 175 lb 8 oz (79.6 kg)  Height: 5\' 5"  (1.651 m)    Body mass index is 29.2 kg/m.  Nursing Note and Vital Signs reviewed.  Physical Exam  Constitutional: She is oriented to person, place, and time. She appears well-developed and well-nourished.  HENT:  Head: Normocephalic and atraumatic.  Cardiovascular: Normal rate and regular rhythm.  Pulmonary/Chest: Breath sounds normal.  Abdominal: Normal appearance and normal aorta. She exhibits no distension and no mass. Bowel sounds are increased. There is tenderness in the right lower quadrant. There is no rigidity, no rebound, no guarding and no CVA tenderness.  Genitourinary: Rectum normal. Rectal exam shows no tenderness and guaiac negative stool.    Tender: not present. Right adnexum displays no mass and no tenderness. Left adnexum displays no mass and no tenderness. No erythema, tenderness or bleeding in the vagina. No foreign body in the vagina. Vaginal discharge (scant white discharge) found.  Neurological: She is alert and oriented to person, place,  and time.  Skin: Skin is warm and dry.  Psychiatric: She has a normal mood and affect. Her behavior is normal.       No results found for this or any previous visit (from the past 48 hour(s)).  Assessment & Plan  1. Right lower quadrant pain Discussed with PCP recommend imaging. Patient has negative hemoccult, no blood in urine and no bleeding noted during vaginal exams- no tears or masses or pain upon exam. Will look for appendicitis, mass, diverticulitis, continue to monitor symptoms and refer to GYN or appropriate specialist pending results.  - CBC with Differential - Comprehensive Metabolic Panel (CMET) - Cervicovaginal ancillary only - CT Abdomen Pelvis W Contrast; Future - CT Abdomen Pelvis W Contrast  2. Postmenopausal vaginal bleeding - CBC with Differential - POCT Urinalysis Dipstick - POCT occult blood stool - CT Abdomen Pelvis W Contrast; Future - CT Abdomen Pelvis W Contrast  3. Constipation, unspecified constipation type Discussed OTC management of constipation   4. Vaginal discharge - Cervicovaginal ancillary only

## 2018-11-20 LAB — CERVICOVAGINAL ANCILLARY ONLY
BACTERIAL VAGINITIS: NEGATIVE
CANDIDA VAGINITIS: NEGATIVE
Chlamydia: NEGATIVE
Neisseria Gonorrhea: NEGATIVE
Trichomonas: NEGATIVE

## 2018-11-20 LAB — COMPREHENSIVE METABOLIC PANEL
ALK PHOS: 63 IU/L (ref 39–117)
ALT: 17 IU/L (ref 0–32)
AST: 18 IU/L (ref 0–40)
Albumin/Globulin Ratio: 2 (ref 1.2–2.2)
Albumin: 4.7 g/dL (ref 3.5–5.5)
BUN/Creatinine Ratio: 17 (ref 9–23)
BUN: 15 mg/dL (ref 6–24)
Bilirubin Total: <0.2 mg/dL (ref 0.0–1.2)
CO2: 22 mmol/L (ref 20–29)
CREATININE: 0.89 mg/dL (ref 0.57–1.00)
Calcium: 9.6 mg/dL (ref 8.7–10.2)
Chloride: 106 mmol/L (ref 96–106)
GFR calc Af Amer: 86 mL/min/{1.73_m2} (ref >59)
GFR calc non Af Amer: 74 mL/min/{1.73_m2} (ref >59)
Globulin, Total: 2.4 g/dL (ref 1.5–4.5)
Glucose: 79 mg/dL (ref 65–99)
POTASSIUM: 4.6 mmol/L (ref 3.5–5.2)
Sodium: 148 mmol/L — ABNORMAL HIGH (ref 134–144)
Total Protein: 7.1 g/dL (ref 6.0–8.5)

## 2018-11-20 LAB — CBC WITH DIFFERENTIAL/PLATELET
BASOS ABS: 0 10*3/uL (ref 0.0–0.2)
Basos: 0 %
EOS (ABSOLUTE): 0.2 10*3/uL (ref 0.0–0.4)
Eos: 3 %
HEMATOCRIT: 41.1 % (ref 34.0–46.6)
Hemoglobin: 13.7 g/dL (ref 11.1–15.9)
Immature Grans (Abs): 0 10*3/uL (ref 0.0–0.1)
Immature Granulocytes: 0 %
LYMPHS ABS: 2.5 10*3/uL (ref 0.7–3.1)
Lymphs: 33 %
MCH: 31.1 pg (ref 26.6–33.0)
MCHC: 33.3 g/dL (ref 31.5–35.7)
MCV: 93 fL (ref 79–97)
MONOS ABS: 0.4 10*3/uL (ref 0.1–0.9)
Monocytes: 6 %
NEUTROS ABS: 4.3 10*3/uL (ref 1.4–7.0)
Neutrophils: 58 %
Platelets: 339 10*3/uL (ref 150–450)
RBC: 4.4 x10E6/uL (ref 3.77–5.28)
RDW: 12.1 % — AB (ref 12.3–15.4)
WBC: 7.5 10*3/uL (ref 3.4–10.8)

## 2018-12-04 ENCOUNTER — Encounter: Payer: Self-pay | Admitting: Family Medicine

## 2018-12-27 ENCOUNTER — Ambulatory Visit: Payer: Commercial Managed Care - PPO

## 2019-01-08 ENCOUNTER — Ambulatory Visit
Admission: RE | Admit: 2019-01-08 | Discharge: 2019-01-08 | Disposition: A | Discharge status: Home / Self Care | Payer: Commercial Managed Care - PPO | Source: Ambulatory Visit | Attending: Family Medicine | Admitting: Family Medicine

## 2019-01-08 DIAGNOSIS — Z1231 Encounter for screening mammogram for malignant neoplasm of breast: Secondary | ICD-10-CM | POA: Insufficient documentation

## 2019-01-14 ENCOUNTER — Encounter: Payer: Self-pay | Admitting: Family Medicine

## 2019-01-21 ENCOUNTER — Other Ambulatory Visit: Payer: Self-pay | Admitting: Family Medicine

## 2019-01-21 DIAGNOSIS — E039 Hypothyroidism, unspecified: Secondary | ICD-10-CM

## 2019-01-21 NOTE — Telephone Encounter (Signed)
She needs follow up

## 2019-01-21 NOTE — Telephone Encounter (Signed)
Does she have enough medication? Just in case we need to change the dose

## 2019-01-21 NOTE — Telephone Encounter (Signed)
LVM for pt to call the office to schedule an appt for med refill °

## 2019-01-21 NOTE — Telephone Encounter (Signed)
Refill request for thyroid medication: Levothyroxine 112 mcg  Last Physical: 05/01/2018  Lab Results  Component Value Date   TSH 4.260 07/25/2018    Follow-ups on file. 03/20/2019

## 2019-01-21 NOTE — Telephone Encounter (Signed)
Pt has an appt with dr Ancil Boozer on 01-23-2019

## 2019-01-23 ENCOUNTER — Encounter: Payer: Self-pay | Admitting: Family Medicine

## 2019-01-23 ENCOUNTER — Ambulatory Visit: Payer: Commercial Managed Care - PPO | Admitting: Family Medicine

## 2019-01-23 VITALS — BP 136/82 | HR 78 | Temp 97.7°F | Resp 16 | Ht 65.0 in | Wt 176.7 lb

## 2019-01-23 DIAGNOSIS — D8989 Other specified disorders involving the immune mechanism, not elsewhere classified: Secondary | ICD-10-CM

## 2019-01-23 DIAGNOSIS — E559 Vitamin D deficiency, unspecified: Secondary | ICD-10-CM | POA: Diagnosis not present

## 2019-01-23 DIAGNOSIS — R2689 Other abnormalities of gait and mobility: Secondary | ICD-10-CM

## 2019-01-23 DIAGNOSIS — Z23 Encounter for immunization: Secondary | ICD-10-CM | POA: Diagnosis not present

## 2019-01-23 DIAGNOSIS — R7303 Prediabetes: Secondary | ICD-10-CM

## 2019-01-23 DIAGNOSIS — R42 Dizziness and giddiness: Secondary | ICD-10-CM

## 2019-01-23 DIAGNOSIS — R29818 Other symptoms and signs involving the nervous system: Secondary | ICD-10-CM

## 2019-01-23 DIAGNOSIS — R232 Flushing: Secondary | ICD-10-CM | POA: Diagnosis not present

## 2019-01-23 DIAGNOSIS — M797 Fibromyalgia: Secondary | ICD-10-CM | POA: Diagnosis not present

## 2019-01-23 DIAGNOSIS — E785 Hyperlipidemia, unspecified: Secondary | ICD-10-CM

## 2019-01-23 DIAGNOSIS — H9312 Tinnitus, left ear: Secondary | ICD-10-CM

## 2019-01-23 DIAGNOSIS — R5382 Chronic fatigue, unspecified: Secondary | ICD-10-CM

## 2019-01-23 DIAGNOSIS — E039 Hypothyroidism, unspecified: Secondary | ICD-10-CM

## 2019-01-23 MED ORDER — PREGABALIN 75 MG PO CAPS: 75.0000 mg | ORAL_CAPSULE | Freq: Two times a day (BID) | ORAL | 1 refills | Status: DC

## 2019-01-23 MED ORDER — ESTRADIOL 0.5 MG PO TABS: 0.5000 mg | ORAL_TABLET | Freq: Every day | ORAL | 1 refills | Status: DC

## 2019-01-23 MED ORDER — DULOXETINE HCL 60 MG PO CPEP: 60.0000 mg | ORAL_CAPSULE | Freq: Every day | ORAL | 1 refills | Status: DC

## 2019-01-23 MED ORDER — VITAMIN D (ERGOCALCIFEROL) 1.25 MG (50000 UNIT) PO CAPS: 50000.0000 [IU] | ORAL_CAPSULE | ORAL | 1 refills | Status: DC

## 2019-01-23 NOTE — Progress Notes (Signed)
Name: Monica Hardin   MRN: [id number]    DOB: [birth year]   Date:01/23/2019       Progress Note  Subjective  Chief Complaint  Chief Complaint  Patient presents with  . Medication Refill  . Fibromyalgia  . Vitamin D Deficiency  . Hypothyroidism  . Dizziness    Onset-1 month-progressing getting worst the past couple of weeks- puffy face, face numbness, left ear stuffy.    HPI  FMS: she saw Dr. Jefferson Fuel back in 05/2018 and was given formal diagnosis of FMS. She started on Duloxetine and states pain has improved since, still feels very achy and legs are heavy at the end of the day, discussed adding Lyrica and she willing to try, she will start taking one qpm and go up to BID if needed. . pain level right now is 2/10. She still has some mental fogginess   Hypothyroidism: she has been on levothyroxine for years, but TSH spikes, she was feeling more tired, TPO positive and she was seen by Dr. Gabriel Carina who is prescribed medication now. No hair loss, no constipation   Pre-diabetes: last hgbA1C was 5.4%with change of life style, weight is stable, no polyphagia, polydipsia or polyuria.   Dyslipidemia; not on medication, on life style modification only.   Chronic fatigue: she is able to work a full time job, but has difficulty maintaining her house, she goes to bed around  9 pm now.    Dizziness: she states she had an URI about 2 weeks ago and has noticed some left ear fullness, no tinnitus, but also has a sensation of movement inside of her head that over the past few days it has been constant, she states she can walk straight but has to focus on a point ( looking straight ahead) , mild intermittent headache, no nausea or vomiting, no fever or chills. Denies hearing loss. Very difficulty to stand with eyes closed , unable to close her eyes when showering    Patient Active Problem List   Diagnosis Date Noted  . Primary osteoarthritis of both hands 06/15/2018  . Primary osteoarthritis of both  knees 06/15/2018  . Primary osteoarthritis of both feet 06/15/2018  . Positive ANA (antinuclear antibody) 06/15/2018  . Fibromyalgia 06/15/2018  . Encounter for screening colonoscopy 11/07/2016  . Surgical menopause on hormone replacement therapy 09/01/2016  . Vitamin D deficiency 04/20/2016  . Low vitamin B12 level 04/20/2016  . History of uterine leiomyoma 04/19/2016  . Sciatica, right side 04/19/2016  . Status post TAH-BSO 11/12/2015  . History of ovarian cyst 11/12/2015  . Dyslipidemia 07/04/2015  . Adult hypothyroidism 07/04/2015  . Excess weight 07/04/2015  . Disturbance of skin sensation 07/04/2015  . Lipoma of arm 12/03/2014  . CFIDS (chronic fatigue and immune dysfunction syndrome) ([place]) 05/08/2007    Past Surgical History:  Procedure Laterality Date  . ABDOMINAL HYSTERECTOMY N/A 10/05/2015   Procedure: HYSTERECTOMY ABDOMINAL;  Surgeon: [name], MD;  Location: [facility];  Service: Gynecology;  Laterality: N/A;  . CHOLECYSTECTOMY N/A 03/27/2017   Procedure: LAPAROSCOPIC CHOLECYSTECTOMY;  Surgeon: Olean Ree, MD;  Location: [facility];  Service: General;  Laterality: N/A;  . COLONOSCOPY WITH PROPOFOL N/A 11/22/2016   Procedure: COLONOSCOPY WITH PROPOFOL;  Surgeon: Robert Bellow, MD;  Location: [facility];  Service: Endoscopy;  Laterality: N/A;  . FRACTURE SURGERY    . LAPAROSCOPY N/A 08/24/2015   Procedure: LAPAROSCOPY DIAGNOSTIC;  Surgeon: [name], MD;  Location: [facility];  Service: Gynecology;  Laterality:  N/A;  . LEEP  [id number]   [facility]  . [place]  . SALPINGOOPHORECTOMY Bilateral 10/05/2015   Procedure: SALPINGO OOPHORECTOMY;  Surgeon: [name], MD;  Location: [facility];  Service: Gynecology;  Laterality: Bilateral;    Family History  Problem Relation Age of Onset  . Lung cancer Mother   . Cancer Mother   . Lung cancer Father   . Cancer Father   . Kidney Stones Daughter   . Asthma Son   . Breast  cancer Paternal Aunt 26  . Diabetes Paternal Grandmother   . Thyroid disease Sister     Social History   Socioeconomic History  . Marital status: Married    Spouse name: Marden Noble  . Number of children: 2  . Years of education: Not on file  . Highest education level: Associate degree: academic program  Occupational History  . Occupation: Office manager: [employer]  Social Needs  . Financial resource strain: Not very hard  . Food insecurity:    Worry: Never true    Inability: Never true  . Transportation needs:    Medical: No    Non-medical: No  Tobacco Use  . Smoking status: Never Smoker  . Smokeless tobacco: Never Used  Substance and Sexual Activity  . Alcohol use: No    Alcohol/week: 0.0 standard drinks  . Drug use: Never  . Sexual activity: Yes    Partners: Male    Birth control/protection: None    Comment: Hysterectomy  Lifestyle  . Physical activity:    Days per week: 3 days    Minutes per session: 10 min  . Stress: Not at all  Relationships  . Social connections:    Talks on phone: More than three times a week    Gets together: Twice a week    Attends religious service: More than 4 times per year    Active member of club or organization: Yes    Attends meetings of clubs or organizations: More than 4 times per year    Relationship status: Married  . Intimate partner violence:    Fear of current or ex partner: No    Emotionally abused: No    Physically abused: No    Forced sexual activity: No  Other Topics Concern  . Not on file  Social History Narrative   Two grown children, married   Works at [employer]      Current Outpatient Medications:  .  DULoxetine (CYMBALTA) 60 MG capsule, Take 1 capsule (60 mg total) by mouth daily., Disp: 90 capsule, Rfl: 1 .  estradiol (ESTRACE) 0.5 MG tablet, Take 1 tablet (0.5 mg total) by mouth daily., Disp: 90 tablet, Rfl: 1 .  levothyroxine (SYNTHROID, LEVOTHROID) 112 MCG tablet, Take 1 tablet (112 mcg total) by mouth  daily before breakfast., Disp: 90 tablet, Rfl: 1 .  Vitamin D, Ergocalciferol, (DRISDOL) 1.25 MG (50000 UT) CAPS capsule, Take 1 capsule (50,000 Units total) by mouth once a week., Disp: 12 capsule, Rfl: 1 .  pregabalin (LYRICA) 75 MG capsule, Take 1 capsule (75 mg total) by mouth 2 (two) times daily., Disp: 180 capsule, Rfl: 1  No Known Allergies  I personally reviewed active problem list, medication list, allergies, family history, social history with the patient/caregiver today.   ROS  Constitutional: Negative for fever or weight change.  Respiratory: Negative for cough and shortness of breath.   Cardiovascular: Negative for chest pain or palpitations.  Gastrointestinal: Negative for abdominal  pain, no bowel changes.  Musculoskeletal: Negative for gait problem or joint swelling.  Skin: Negative for rash.  Neurological: positive  for dizziness but no  headache.  No other specific complaints in a complete review of systems (except as listed in HPI above).  Objective  Vitals:   01/23/19 1539  BP: 136/82  Pulse: 78  Resp: 16  Temp: 97.7 F (36.5 C)  TempSrc: Oral  SpO2: 98%  Weight: 176 lb 11.2 oz (80.2 kg)  Height: 5\' 5"  (1.651 m)    Body mass index is 29.4 kg/m.  Physical Exam  Constitutional: Patient appears well-developed and well-nourished. Overweight.  No distress.  HEENT: head atraumatic, normocephalic, pupils equal and reactive to light, ears normal TM neck supple, throat within normal limits Cardiovascular: Normal rate, regular rhythm and normal heart sounds.  No murmur heard. No BLE edema. Pulmonary/Chest: Effort normal and breath sounds normal. No respiratory distress. Abdominal: Soft.  There is no tenderness. Muscular Skeletal: trigger point positive  Neurological: unable to stand still with eyes closed, normal cranial nerves, slow gait, no nystagmus  Psychiatric: Patient has a normal mood and affect. behavior is normal. Judgment and thought content  normal.  Recent Results (from the past 2160 hour(s))  Cervicovaginal ancillary only     Status: None   Collection Time: 11/19/18 12:00 AM  Result Value Ref Range   Bacterial vaginitis Negative for Bacterial Vaginitis Microorganisms     Comment: Normal Reference Range - Negative   Candida vaginitis Negative for Candida species     Comment: Normal Reference Range - Negative   Chlamydia Negative     Comment: Normal Reference Range - Negative   Neisseria gonorrhea Negative     Comment: Normal Reference Range - Negative   Trichomonas Negative     Comment: Normal Reference Range - Negative  POCT occult blood stool     Status: Normal   Collection Time: 11/19/18  3:15 PM  Result Value Ref Range   Fecal Occult Blood, POC Negative Negative   Card #1 Date     Card #2 Fecal Occult Blod, POC     Card #2 Date     Card #3 Fecal Occult Blood, POC     Card #3 Date    POCT Urinalysis Dipstick     Status: Abnormal   Collection Time: 11/19/18  3:20 PM  Result Value Ref Range   Color, UA yellow    Clarity, UA clear    Glucose, UA Negative Negative   Bilirubin, UA negative    Ketones, UA negative    Spec Grav, UA 1.010 1.010 - 1.025   Blood, UA negative    pH, UA 6.0 5.0 - 8.0   Protein, UA Negative Negative   Urobilinogen, UA negative (A) 0.2 or 1.0 E.U./dL   Nitrite, UA negative    Leukocytes, UA Negative Negative   Appearance clear    Odor none   CBC with Differential     Status: Abnormal   Collection Time: 11/19/18  3:45 PM  Result Value Ref Range   WBC 7.5 3.4 - 10.8 x10E3/uL   RBC 4.40 3.77 - 5.28 x10E6/uL   Hemoglobin 13.7 11.1 - 15.9 g/dL   Hematocrit 41.1 34.0 - 46.6 %   MCV 93 79 - 97 fL   MCH 31.1 26.6 - 33.0 pg   MCHC 33.3 31.5 - 35.7 g/dL   RDW 12.1 (L) 12.3 - 15.4 %   Platelets 339 150 - 450 x10E3/uL   Neutrophils 58  Not Estab. %   Lymphs 33 Not Estab. %   Monocytes 6 Not Estab. %   Eos 3 Not Estab. %   Basos 0 Not Estab. %   Neutrophils Absolute 4.3 1.4 - 7.0  x10E3/uL   Lymphocytes Absolute 2.5 0.7 - 3.1 x10E3/uL   Monocytes Absolute 0.4 0.1 - 0.9 x10E3/uL   EOS (ABSOLUTE) 0.2 0.0 - 0.4 x10E3/uL   Basophils Absolute 0.0 0.0 - 0.2 x10E3/uL   Immature Granulocytes 0 Not Estab. %   Immature Grans (Abs) 0.0 0.0 - 0.1 x10E3/uL  Comprehensive Metabolic Panel (CMET)     Status: Abnormal   Collection Time: 11/19/18  3:45 PM  Result Value Ref Range   Glucose 79 65 - 99 mg/dL   BUN 15 6 - 24 mg/dL   Creatinine, Ser 0.89 0.57 - 1.00 mg/dL   GFR calc non Af Amer 74 >59 mL/min/1.73   GFR calc Af Amer 86 >59 mL/min/1.73   BUN/Creatinine Ratio 17 9 - 23   Sodium 148 (H) 134 - 144 mmol/L   Potassium 4.6 3.5 - 5.2 mmol/L   Chloride 106 96 - 106 mmol/L   CO2 22 20 - 29 mmol/L   Calcium 9.6 8.7 - 10.2 mg/dL   Total Protein 7.1 6.0 - 8.5 g/dL   Albumin 4.7 3.5 - 5.5 g/dL   Globulin, Total 2.4 1.5 - 4.5 g/dL   Albumin/Globulin Ratio 2.0 1.2 - 2.2   Bilirubin Total <0.2 0.0 - 1.2 mg/dL   Alkaline Phosphatase 63 39 - 117 IU/L   AST 18 0 - 40 IU/L   ALT 17 0 - 32 IU/L     PHQ2/9: Depression screen [facility] 2/9 01/23/2019 11/19/2018 07/19/2018 05/01/2018 03/21/2017  Decreased Interest 0 0 0 0 0  Down, Depressed, Hopeless 0 0 0 0 0  PHQ - 2 Score 0 0 0 0 0  Altered sleeping 2 0 0 - -  Tired, decreased energy 3 0 1 - -  Change in appetite 0 0 0 - -  Feeling bad or failure about yourself  0 0 0 - -  Trouble concentrating 0 0 0 - -  Moving slowly or fidgety/restless 0 0 0 - -  Suicidal thoughts 0 0 0 - -  PHQ-9 Score 5 0 1 - -  Difficult doing work/chores Not difficult at all Not difficult at all - - -     Fall Risk: Fall Risk  01/23/2019 11/19/2018 07/19/2018 06/15/2018 05/01/2018  Falls in the past year? 1 0 No No No  Number falls in past yr: 1 0 - - -  Injury with Fall? 0 0 - - -  Risk for fall due to : Impaired balance/gait - - - -     Functional Status Survey: Is the patient deaf or have difficulty hearing?: No Does the patient have difficulty  seeing, even when wearing glasses/contacts?: Yes Does the patient have difficulty concentrating, remembering, or making decisions?: No Does the patient have difficulty walking or climbing stairs?: No Does the patient have difficulty dressing or bathing?: No Does the patient have difficulty doing errands alone such as visiting a doctor's office or shopping?: No   Assessment & Plan  1. Fibromyalgia  - DULoxetine (CYMBALTA) 60 MG capsule; Take 1 capsule (60 mg total) by mouth daily.  Dispense: 90 capsule; Refill: 1 - pregabalin (LYRICA) 75 MG capsule; Take 1 capsule (75 mg total) by mouth 2 (two) times daily.  Dispense: 180 capsule; Refill: 1  2. Need for  immunization against influenza  She refused   3. Hot flashes  - estradiol (ESTRACE) 0.5 MG tablet; Take 1 tablet (0.5 mg total) by mouth daily.  Dispense: 90 tablet; Refill: 1  4. Vitamin D deficiency  - Vitamin D, Ergocalciferol, (DRISDOL) 1.25 MG (50000 UT) CAPS capsule; Take 1 capsule (50,000 Units total) by mouth once a week.  Dispense: 12 capsule; Refill: 1  5. CFIDS (chronic fatigue and immune dysfunction syndrome) ([facility])   6. Prediabetes  Last hgb A1C was normal   7. Adult hypothyroidism  Seeing Dr. Gabriel Carina now   8. Dyslipidemia   9. Tinnitus of left ear  - CT Head Wo Contrast; Future  10. Vertigo  - CT Head Wo Contrast; Future  11. Balance problem  - CT Head Wo Contrast; Future  12. Other symptoms and signs involving the nervous system  - CT Head Wo Contrast; Future, if negative CT she would like to see Dr. Tami Ribas prior to seeing Neurologist

## 2019-01-27 ENCOUNTER — Other Ambulatory Visit: Payer: Self-pay | Admitting: Family Medicine

## 2019-01-27 DIAGNOSIS — E039 Hypothyroidism, unspecified: Secondary | ICD-10-CM

## 2019-02-01 ENCOUNTER — Ambulatory Visit
Admission: RE | Admit: 2019-02-01 | Discharge: 2019-02-01 | Disposition: A | Discharge status: Home / Self Care | Payer: Commercial Managed Care - PPO | Source: Ambulatory Visit | Attending: Family Medicine | Admitting: Family Medicine

## 2019-02-01 DIAGNOSIS — R2689 Other abnormalities of gait and mobility: Secondary | ICD-10-CM | POA: Diagnosis present

## 2019-02-01 DIAGNOSIS — R42 Dizziness and giddiness: Secondary | ICD-10-CM | POA: Diagnosis not present

## 2019-02-01 DIAGNOSIS — H9312 Tinnitus, left ear: Secondary | ICD-10-CM | POA: Insufficient documentation

## 2019-02-01 DIAGNOSIS — R29818 Other symptoms and signs involving the nervous system: Secondary | ICD-10-CM

## 2019-02-04 ENCOUNTER — Encounter: Payer: Self-pay | Admitting: Family Medicine

## 2019-02-13 ENCOUNTER — Other Ambulatory Visit: Payer: Self-pay | Admitting: Family Medicine

## 2019-02-13 DIAGNOSIS — G9332 Myalgic encephalomyelitis/chronic fatigue syndrome: Secondary | ICD-10-CM

## 2019-02-13 DIAGNOSIS — D8989 Other specified disorders involving the immune mechanism, not elsewhere classified: Secondary | ICD-10-CM

## 2019-02-13 DIAGNOSIS — R5382 Chronic fatigue, unspecified: Principal | ICD-10-CM

## 2019-02-13 DIAGNOSIS — M2550 Pain in unspecified joint: Secondary | ICD-10-CM

## 2019-02-13 DIAGNOSIS — R7982 Elevated C-reactive protein (CRP): Secondary | ICD-10-CM

## 2019-02-27 DIAGNOSIS — I8393 Asymptomatic varicose veins of bilateral lower extremities: Secondary | ICD-10-CM

## 2019-02-27 DIAGNOSIS — R5382 Chronic fatigue, unspecified: Secondary | ICD-10-CM | POA: Diagnosis not present

## 2019-02-27 DIAGNOSIS — M19041 Primary osteoarthritis, right hand: Secondary | ICD-10-CM | POA: Diagnosis not present

## 2019-02-27 DIAGNOSIS — M19071 Primary osteoarthritis, right ankle and foot: Secondary | ICD-10-CM | POA: Diagnosis not present

## 2019-02-27 HISTORY — DX: Asymptomatic varicose veins of bilateral lower extremities: I83.93

## 2019-04-23 DIAGNOSIS — E038 Other specified hypothyroidism: Secondary | ICD-10-CM | POA: Diagnosis not present

## 2019-04-23 DIAGNOSIS — E063 Autoimmune thyroiditis: Secondary | ICD-10-CM | POA: Diagnosis not present

## 2019-04-29 DIAGNOSIS — E038 Other specified hypothyroidism: Secondary | ICD-10-CM | POA: Diagnosis not present

## 2019-04-29 DIAGNOSIS — E063 Autoimmune thyroiditis: Secondary | ICD-10-CM | POA: Diagnosis not present

## 2019-05-08 ENCOUNTER — Encounter: Payer: Self-pay | Admitting: Family Medicine

## 2019-07-26 ENCOUNTER — Ambulatory Visit: Payer: Commercial Managed Care - PPO | Admitting: Family Medicine

## 2019-08-01 ENCOUNTER — Other Ambulatory Visit: Payer: Self-pay | Admitting: Family Medicine

## 2019-08-01 DIAGNOSIS — R232 Flushing: Secondary | ICD-10-CM

## 2019-08-01 DIAGNOSIS — M797 Fibromyalgia: Secondary | ICD-10-CM

## 2019-08-01 NOTE — Telephone Encounter (Signed)
Refill request for general medication. Estradiol and Cymbalta   Last office visit 01/23/2019   Follow up on 08/30/2019

## 2019-08-28 ENCOUNTER — Encounter: Payer: Self-pay | Admitting: Family Medicine

## 2019-08-30 ENCOUNTER — Ambulatory Visit (INDEPENDENT_AMBULATORY_CARE_PROVIDER_SITE_OTHER): Payer: Commercial Managed Care - PPO | Admitting: Family Medicine

## 2019-08-30 ENCOUNTER — Encounter: Payer: Self-pay | Admitting: Family Medicine

## 2019-08-30 ENCOUNTER — Other Ambulatory Visit: Payer: Self-pay

## 2019-08-30 VITALS — BP 108/62 | HR 99 | Temp 97.5°F | Resp 16 | Ht 65.0 in | Wt 177.8 lb

## 2019-08-30 DIAGNOSIS — R5382 Chronic fatigue, unspecified: Secondary | ICD-10-CM | POA: Diagnosis not present

## 2019-08-30 DIAGNOSIS — D8989 Other specified disorders involving the immune mechanism, not elsewhere classified: Secondary | ICD-10-CM

## 2019-08-30 DIAGNOSIS — R7982 Elevated C-reactive protein (CRP): Secondary | ICD-10-CM

## 2019-08-30 DIAGNOSIS — E785 Hyperlipidemia, unspecified: Secondary | ICD-10-CM

## 2019-08-30 DIAGNOSIS — R7303 Prediabetes: Secondary | ICD-10-CM

## 2019-08-30 DIAGNOSIS — E039 Hypothyroidism, unspecified: Secondary | ICD-10-CM

## 2019-08-30 DIAGNOSIS — E538 Deficiency of other specified B group vitamins: Secondary | ICD-10-CM

## 2019-08-30 DIAGNOSIS — Z23 Encounter for immunization: Secondary | ICD-10-CM | POA: Diagnosis not present

## 2019-08-30 DIAGNOSIS — M797 Fibromyalgia: Secondary | ICD-10-CM

## 2019-08-30 DIAGNOSIS — Z79899 Other long term (current) drug therapy: Secondary | ICD-10-CM

## 2019-08-30 DIAGNOSIS — M2550 Pain in unspecified joint: Secondary | ICD-10-CM

## 2019-08-30 DIAGNOSIS — E559 Vitamin D deficiency, unspecified: Secondary | ICD-10-CM

## 2019-08-30 DIAGNOSIS — R768 Other specified abnormal immunological findings in serum: Secondary | ICD-10-CM

## 2019-08-30 MED ORDER — VITAMIN D (ERGOCALCIFEROL) 1.25 MG (50000 UNIT) PO CAPS: 50000.0000 [IU] | ORAL_CAPSULE | ORAL | 1 refills | Status: DC

## 2019-08-30 NOTE — Progress Notes (Signed)
Name: Monica Hardin   MRN: [id number]    DOB: 1966/07/14   Date:08/30/2019       Progress Note  Subjective  Chief Complaint  Chief Complaint  Patient presents with  . Medication Refill    6 month F/U  . Fibromyalgia  . Hypothyroidism  . Dyslipidemia  . Chronic Fatigue    HPI  FMS: she saw Dr. Jefferson Fuel back in 05/2018 and was given formal diagnosis of FMS. She started on Duloxetine and states pain has improved since, still feels very achy and legs are heavy at the end of the day, we added Lyrica in January and she states the combination has helped with symptoms. She states her arms feel aching and heavy. She states last week she had a flare, started with nausea, followed by feeling like her entire body was bruised but feeling better since yesterday. No fever or chills.   Hypothyroidism: she has been on levothyroxine for years, TPO positive and she was seen by Dr. Gabriel Carina who is prescribed medication now. No hair loss, no constipation, or dry skin   Pre-diabetes: last hgbA1C was 5.4%with change of life style, weight is stable, no polyphagia, polydipsia or polyuria.   Dyslipidemia; not on medication, on life style modification only.   Chronic fatigue: she is able to work a full time job, but has difficulty maintaining her house, she goes to bed around  9 pm now.    Dizziness: she states she had an URI about 2 weeks ago and has noticed some left ear fullness, no tinnitus, but also has a sensation of movement inside of her head that over the past few days it has been constant, she states she can walk straight but has to focus on a point ( looking straight ahead) , mild intermittent headache, no nausea or vomiting, no fever or chills. Denies hearing loss. Very difficulty to stand with eyes closed , unable to close her eyes when showering   Patient Active Problem List   Diagnosis Date Noted  . Primary osteoarthritis of both hands 06/15/2018  . Primary osteoarthritis of both knees  06/15/2018  . Primary osteoarthritis of both feet 06/15/2018  . Positive ANA (antinuclear antibody) 06/15/2018  . Fibromyalgia 06/15/2018  . Encounter for screening colonoscopy 11/07/2016  . Surgical menopause on hormone replacement therapy 09/01/2016  . Vitamin D deficiency 04/20/2016  . Low vitamin B12 level 04/20/2016  . History of uterine leiomyoma 04/19/2016  . Sciatica, right side 04/19/2016  . Status post TAH-BSO 11/12/2015  . History of ovarian cyst 11/12/2015  . Dyslipidemia 07/04/2015  . Adult hypothyroidism 07/04/2015  . Excess weight 07/04/2015  . Disturbance of skin sensation 07/04/2015  . Lipoma of arm 12/03/2014  . CFIDS (chronic fatigue and immune dysfunction syndrome) ([place]) 05/08/2007    Past Surgical History:  Procedure Laterality Date  . ABDOMINAL HYSTERECTOMY N/A 10/05/2015   Procedure: HYSTERECTOMY ABDOMINAL;  Surgeon: [name], MD;  Location: [facility];  Service: Gynecology;  Laterality: N/A;  . CHOLECYSTECTOMY N/A 03/27/2017   Procedure: LAPAROSCOPIC CHOLECYSTECTOMY;  Surgeon: Olean Ree, MD;  Location: [facility];  Service: General;  Laterality: N/A;  . COLONOSCOPY WITH PROPOFOL N/A 11/22/2016   Procedure: COLONOSCOPY WITH PROPOFOL;  Surgeon: Robert Bellow, MD;  Location: [facility];  Service: Endoscopy;  Laterality: N/A;  . FRACTURE SURGERY    . LAPAROSCOPY N/A 08/24/2015   Procedure: LAPAROSCOPY DIAGNOSTIC;  Surgeon: [name], MD;  Location: [facility];  Service: Gynecology;  Laterality: N/A;  .  LEEP  [id number]   [facility]  . [place]  . SALPINGOOPHORECTOMY Bilateral 10/05/2015   Procedure: SALPINGO OOPHORECTOMY;  Surgeon: [name], MD;  Location: [facility];  Service: Gynecology;  Laterality: Bilateral;    Family History  Problem Relation Age of Onset  . Lung cancer Mother   . Cancer Mother   . Lung cancer Father   . Cancer Father   . Kidney Stones Daughter   . Asthma Son   . Breast cancer  Paternal Aunt 65  . Diabetes Paternal Grandmother   . Thyroid disease Sister     Social History   Socioeconomic History  . Marital status: Married    Spouse name: Marden Noble  . Number of children: 2  . Years of education: Not on file  . Highest education level: Associate degree: academic program  Occupational History  . Occupation: Office manager: [employer]  Social Needs  . Financial resource strain: Not very hard  . Food insecurity    Worry: Never true    Inability: Never true  . Transportation needs    Medical: No    Non-medical: No  Tobacco Use  . Smoking status: Never Smoker  . Smokeless tobacco: Never Used  Substance and Sexual Activity  . Alcohol use: No    Alcohol/week: 0.0 standard drinks  . Drug use: Never  . Sexual activity: Yes    Partners: Male    Birth control/protection: None    Comment: Hysterectomy  Lifestyle  . Physical activity    Days per week: 3 days    Minutes per session: 10 min  . Stress: Not at all  Relationships  . Social connections    Talks on phone: More than three times a week    Gets together: Twice a week    Attends religious service: More than 4 times per year    Active member of club or organization: Yes    Attends meetings of clubs or organizations: More than 4 times per year    Relationship status: Married  . Intimate partner violence    Fear of current or ex partner: No    Emotionally abused: No    Physically abused: No    Forced sexual activity: No  Other Topics Concern  . Not on file  Social History Narrative   Two grown children, married   Works at [employer]      Current Outpatient Medications:  .  DULoxetine (CYMBALTA) 60 MG capsule, TAKE 1 CAPS BY MOUTH DAILY, Disp: 90 capsule, Rfl: 1 .  estradiol (ESTRACE) 0.5 MG tablet, TAKE 1 TAB BY MOUTH DAIYLY, Disp: 90 tablet, Rfl: 1 .  levothyroxine (SYNTHROID, LEVOTHROID) 112 MCG tablet, Take 1 tablet (112 mcg total) by mouth daily before breakfast., Disp: 90 tablet, Rfl: 1 .   pregabalin (LYRICA) 75 MG capsule, Take 1 capsule (75 mg total) by mouth 2 (two) times daily., Disp: 180 capsule, Rfl: 1 .  Vitamin D, Ergocalciferol, (DRISDOL) 1.25 MG (50000 UT) CAPS capsule, Take 1 capsule (50,000 Units total) by mouth once a week., Disp: 12 capsule, Rfl: 1  No Known Allergies  I personally reviewed active problem list, medication list, allergies, family history, social history, health maintenance with the patient/caregiver today.   ROS  Constitutional: Negative for fever or weight change. Feeling tired  Respiratory: Negative for cough and shortness of breath.   Cardiovascular: Negative for chest pain or palpitations.  Gastrointestinal: Negative for abdominal pain, no bowel changes.  Musculoskeletal: Negative for gait problem or joint swelling.  Skin: Negative for rash.  Neurological: Negative for dizziness or headache.  No other specific complaints in a complete review of systems (except as listed in HPI above).  Objective  Vitals:   08/30/19 1509  BP: 108/62  Pulse: 99  Resp: 16  Temp: (!) 97.5 F (36.4 C)  SpO2: 98%   Physical Exam  Constitutional: Patient appears well-developed and well-nourished.  No distress.  HEENT: head atraumatic, normocephalic, pupils equal and reactive to light Cardiovascular: Normal rate, regular rhythm and normal heart sounds.  No murmur heard. No BLE edema. Pulmonary/Chest: Effort normal and breath sounds normal. No respiratory distress. Abdominal: Soft.  There is no tenderness. Psychiatric: Patient has a normal mood and affect. behavior is normal. Judgment and thought content normal.  PHQ2/9: Depression screen [facility] 2/9 08/30/2019 01/23/2019 11/19/2018 07/19/2018 05/01/2018  Decreased Interest 0 0 0 0 0  Down, Depressed, Hopeless 0 0 0 0 0  PHQ - 2 Score 0 0 0 0 0  Altered sleeping 2 2 0 0 -  Tired, decreased energy 2 3 0 1 -  Change in appetite 1 0 0 0 -  Feeling bad or failure about yourself  0 0 0 0 -  Trouble  concentrating 0 0 0 0 -  Moving slowly or fidgety/restless 0 0 0 0 -  Suicidal thoughts 0 0 0 0 -  PHQ-9 Score 5 5 0 1 -  Difficult doing work/chores Not difficult at all Not difficult at all Not difficult at all - -   PHQ-2/9 Result is negative.    Fall Risk: Fall Risk  08/30/2019 01/23/2019 11/19/2018 07/19/2018 06/15/2018  Falls in the past year? 0 1 0 No No  Number falls in past yr: 0 1 0 - -  Injury with Fall? 0 0 0 - -  Risk for fall due to : - Impaired balance/gait - - -     Assessment & Plan  1. Needs flu shot  - Flu Vaccine QUAD 6+ mos PF IM (Fluarix Quad PF)  2. CFIDS (chronic fatigue and immune dysfunction syndrome) ([facility])   3. Arthralgia, unspecified joint  - IgG, IgA, IgM - ANA,IFA RA Diag Pnl w/rflx Tit/Patn  4. Elevated C-reactive protein  - Sedimentation rate - C-reactive protein - IgG, IgA, IgM  5. Fibromyalgia  Continue duloxetine and lyrica  6. Prediabetes  - Hemoglobin A1c  7. Vitamin D deficiency  - VITAMIN D 25 Hydroxy (Vit-D Deficiency, Fractures) - Vitamin D, Ergocalciferol, (DRISDOL) 1.25 MG (50000 UT) CAPS capsule; Take 1 capsule (50,000 Units total) by mouth once a week.  Dispense: 12 capsule; Refill: 1  8. Adult hypothyroidism  - TSH  9. Dyslipidemia  - Lipid panel  10. Elevated antinuclear antibody (ANA) level  - IgG, IgA, IgM - ANA,IFA RA Diag Pnl w/rflx Tit/Patn  11. Low vitamin B12 level  - CBC with Differential/Platelet - Vitamin B12  12. Long-term use of high-risk medication  - CBC with Differential/Platelet - Comprehensive metabolic panel   I discussed the assessment and treatment plan with the patient. The patient was provided an opportunity to ask questions and all were answered. The patient agreed with the plan and demonstrated an understanding of the instructions.   The patient was advised to call back or seek an in-person evaluation if the symptoms worsen or if the condition fails to improve as  anticipated.  I provided 25  minutes of non-face-to-face time during this encounter.  Bethena Roys  Ancil Boozer, MD

## 2019-10-15 ENCOUNTER — Encounter: Payer: Self-pay | Admitting: Family Medicine

## 2019-11-14 ENCOUNTER — Encounter: Payer: Self-pay | Admitting: Family Medicine

## 2019-11-14 LAB — COMPREHENSIVE METABOLIC PANEL
ALT: 15 IU/L (ref 0–32)
AST: 16 IU/L (ref 0–40)
Albumin/Globulin Ratio: 1.8 (ref 1.2–2.2)
Albumin: 4.4 g/dL (ref 3.8–4.9)
Alkaline Phosphatase: 76 IU/L (ref 39–117)
BUN/Creatinine Ratio: 18 (ref 9–23)
BUN: 18 mg/dL (ref 6–24)
Bilirubin Total: 0.3 mg/dL (ref 0.0–1.2)
CO2: 23 mmol/L (ref 20–29)
Calcium: 9.5 mg/dL (ref 8.7–10.2)
Chloride: 105 mmol/L (ref 96–106)
Creatinine, Ser: 0.98 mg/dL (ref 0.57–1.00)
GFR calc Af Amer: 76 mL/min/{1.73_m2} (ref >59)
GFR calc non Af Amer: 66 mL/min/{1.73_m2} (ref >59)
Globulin, Total: 2.4 g/dL (ref 1.5–4.5)
Glucose: 95 mg/dL (ref 65–99)
Potassium: 4.3 mmol/L (ref 3.5–5.2)
Sodium: 141 mmol/L (ref 134–144)
Total Protein: 6.8 g/dL (ref 6.0–8.5)

## 2019-11-14 LAB — C-REACTIVE PROTEIN: CRP: 6 mg/L (ref 0–10)

## 2019-11-14 LAB — ANA,IFA RA DIAG PNL W/RFLX TIT/PATN
ANA Titer 1: POSITIVE — AB
Cyclic Citrullin Peptide Ab: 5 units (ref 0–19)
Rheumatoid fact SerPl-aCnc: <10 IU/mL (ref 0.0–13.9)

## 2019-11-14 LAB — CBC WITH DIFFERENTIAL/PLATELET
Basophils Absolute: 0 10*3/uL (ref 0.0–0.2)
Basos: 1 %
EOS (ABSOLUTE): 0.2 10*3/uL (ref 0.0–0.4)
Eos: 4 %
Hematocrit: 41.1 % (ref 34.0–46.6)
Hemoglobin: 13.6 g/dL (ref 11.1–15.9)
Immature Grans (Abs): 0 10*3/uL (ref 0.0–0.1)
Immature Granulocytes: 0 %
Lymphocytes Absolute: 2.2 10*3/uL (ref 0.7–3.1)
Lymphs: 37 %
MCH: 30.1 pg (ref 26.6–33.0)
MCHC: 33.1 g/dL (ref 31.5–35.7)
MCV: 91 fL (ref 79–97)
Monocytes Absolute: 0.3 10*3/uL (ref 0.1–0.9)
Monocytes: 5 %
Neutrophils Absolute: 3.3 10*3/uL (ref 1.4–7.0)
Neutrophils: 53 %
Platelets: 303 10*3/uL (ref 150–450)
RBC: 4.52 x10E6/uL (ref 3.77–5.28)
RDW: 12.2 % (ref 11.7–15.4)
WBC: 6 10*3/uL (ref 3.4–10.8)

## 2019-11-14 LAB — FANA STAINING PATTERNS
Homogeneous Pattern: 1:160 {titer} — ABNORMAL HIGH
Speckled Pattern: 1:80 {titer}

## 2019-11-14 LAB — LIPID PANEL
Chol/HDL Ratio: 3.8 ratio (ref 0.0–4.4)
Cholesterol, Total: 226 mg/dL — ABNORMAL HIGH (ref 100–199)
HDL: 59 mg/dL (ref >39)
LDL Chol Calc (NIH): 148 mg/dL — ABNORMAL HIGH (ref 0–99)
Triglycerides: 109 mg/dL (ref 0–149)
VLDL Cholesterol Cal: 19 mg/dL (ref 5–40)

## 2019-11-14 LAB — SEDIMENTATION RATE: Sed Rate: 21 mm/hr (ref 0–40)

## 2019-11-14 LAB — IGG, IGA, IGM
IgA/Immunoglobulin A, Serum: 114 mg/dL (ref 87–352)
IgG (Immunoglobin G), Serum: 938 mg/dL (ref 586–1602)
IgM (Immunoglobulin M), Srm: 281 mg/dL — ABNORMAL HIGH (ref 26–217)

## 2019-11-14 LAB — VITAMIN D 25 HYDROXY (VIT D DEFICIENCY, FRACTURES): Vit D, 25-Hydroxy: 48 ng/mL (ref 30.0–100.0)

## 2019-11-14 LAB — VITAMIN B12: Vitamin B-12: 368 pg/mL (ref 232–1245)

## 2019-11-14 LAB — HEMOGLOBIN A1C
Est. average glucose Bld gHb Est-mCnc: 111 mg/dL
Hgb A1c MFr Bld: 5.5 % (ref 4.8–5.6)

## 2019-11-14 LAB — TSH: TSH: 2.16 u[IU]/mL (ref 0.450–4.500)

## 2019-12-24 ENCOUNTER — Encounter: Payer: Self-pay | Admitting: Family Medicine

## 2019-12-24 ENCOUNTER — Other Ambulatory Visit: Payer: Self-pay | Admitting: Family Medicine

## 2019-12-24 DIAGNOSIS — M797 Fibromyalgia: Secondary | ICD-10-CM

## 2019-12-24 MED ORDER — PREGABALIN 75 MG PO CAPS: 75.0000 mg | ORAL_CAPSULE | Freq: Two times a day (BID) | ORAL | 0 refills | Status: DC

## 2020-02-27 ENCOUNTER — Ambulatory Visit (INDEPENDENT_AMBULATORY_CARE_PROVIDER_SITE_OTHER): Payer: Managed Care, Other (non HMO) | Admitting: Family Medicine

## 2020-02-27 ENCOUNTER — Other Ambulatory Visit: Payer: Self-pay

## 2020-02-27 ENCOUNTER — Encounter: Payer: Self-pay | Admitting: Family Medicine

## 2020-02-27 DIAGNOSIS — E785 Hyperlipidemia, unspecified: Secondary | ICD-10-CM

## 2020-02-27 DIAGNOSIS — R42 Dizziness and giddiness: Secondary | ICD-10-CM

## 2020-02-27 DIAGNOSIS — E559 Vitamin D deficiency, unspecified: Secondary | ICD-10-CM

## 2020-02-27 DIAGNOSIS — R7303 Prediabetes: Secondary | ICD-10-CM

## 2020-02-27 DIAGNOSIS — M797 Fibromyalgia: Secondary | ICD-10-CM | POA: Diagnosis not present

## 2020-02-27 DIAGNOSIS — R5382 Chronic fatigue, unspecified: Secondary | ICD-10-CM

## 2020-02-27 DIAGNOSIS — G9332 Myalgic encephalomyelitis/chronic fatigue syndrome: Secondary | ICD-10-CM

## 2020-02-27 DIAGNOSIS — E538 Deficiency of other specified B group vitamins: Secondary | ICD-10-CM

## 2020-02-27 DIAGNOSIS — E039 Hypothyroidism, unspecified: Secondary | ICD-10-CM

## 2020-02-27 DIAGNOSIS — D8989 Other specified disorders involving the immune mechanism, not elsewhere classified: Secondary | ICD-10-CM

## 2020-02-27 MED ORDER — VITAMIN D (ERGOCALCIFEROL) 1.25 MG (50000 UNIT) PO CAPS: 50000.0000 [IU] | ORAL_CAPSULE | ORAL | 1 refills | Status: DC

## 2020-02-27 MED ORDER — DULOXETINE HCL 60 MG PO CPEP: 60.0000 mg | ORAL_CAPSULE | Freq: Every day | ORAL | 1 refills | Status: DC

## 2020-02-27 MED ORDER — PREGABALIN 75 MG PO CAPS: 75.0000 mg | ORAL_CAPSULE | Freq: Two times a day (BID) | ORAL | 1 refills | Status: DC

## 2020-02-27 NOTE — Progress Notes (Signed)
Name: Monica Hardin   MRN: [id number]    DOB: 05-22-1966   Date:02/27/2020       Progress Note  Subjective  Chief Complaint  Chief Complaint  Patient presents with  . Fibromyalgia  . Hypothyroidism  . Dyslipidemia    I connected with  Kandis Nab on 02/27/20 at  8:00 AM EST by telephone and verified that I am speaking with the correct person using two identifiers.  I discussed the limitations, risks, security and privacy concerns of performing an evaluation and management service by telephone and the availability of in person appointments. Staff also discussed with the patient that there may be a patient responsible charge related to this service. Patient Location: at home  Provider Location: [facility]   HPI  FMS: she saw Dr. Jefferson Fuel back in 05/2018 and was given formal diagnosis of FMS. She started on Duloxetine and states painhas improved since, still feels very achy and legs are heavy at the end of the day, we added Lyrica in January  2020 and she states the combination has helped with symptoms. She continues to have body aches and stiffness. Pain level today is 0/10 just feeling stiff and heavy at this time   Hypothyroidism:she has been on levothyroxine for years, TPO positive and she was seen by Dr. Gabriel Carina who is prescribing the levothyroxine 112 mcg daily  for her. No hair loss, no constipation, or dry skin   Pre-diabetes: last hgbA1C was 5.5%with change of life style,weight is stable, no polyphagia, polydipsia or polyuria.  Dyslipidemia; not on medication,on life style modification only.  The 10-year ASCVD risk score Mikey Bussing DC Jr., et al., 2013) is: 1.8%   Values used to calculate the score:     Age: 54 years     Sex: Female     Is Non-Hispanic African American: No     Diabetic: No     Tobacco smoker: No     Systolic Blood Pressure: [id number] mmHg     Is BP treated: No     HDL Cholesterol: 59 mg/dL     Total Cholesterol: 226  mg/dL  Chronic fatigue: she is able to work a full time job, currently working from home and has helped her , she is going to the gym three times a week to walk or do  bar class to help her get out of the house . She states she still goes to bed early - between 9-9:30 and wakes up between 5:30 -6 am , waking up feeling rested   Dizziness: started after a viral illness about 6 months ago, she states it is happening about once a week, lasts seconds ( when she moves fast) when she moves her head or gets up quickly, not associated with hearing loss or tinnitus   Patient Active Problem List   Diagnosis Date Noted  . Primary osteoarthritis of both hands 06/15/2018  . Primary osteoarthritis of both knees 06/15/2018  . Primary osteoarthritis of both feet 06/15/2018  . Positive ANA (antinuclear antibody) 06/15/2018  . Fibromyalgia 06/15/2018  . Encounter for screening colonoscopy 11/07/2016  . Surgical menopause on hormone replacement therapy 09/01/2016  . Vitamin D deficiency 04/20/2016  . Low vitamin B12 level 04/20/2016  . History of uterine leiomyoma 04/19/2016  . Sciatica, right side 04/19/2016  . Status post TAH-BSO 11/12/2015  . History of ovarian cyst 11/12/2015  . Dyslipidemia 07/04/2015  . Adult hypothyroidism 07/04/2015  . Excess weight 07/04/2015  . Disturbance of skin  sensation 07/04/2015  . Lipoma of arm 12/03/2014  . CFIDS (chronic fatigue and immune dysfunction syndrome) ([place]) 05/08/2007    Past Surgical History:  Procedure Laterality Date  . ABDOMINAL HYSTERECTOMY N/A 10/05/2015   Procedure: HYSTERECTOMY ABDOMINAL;  Surgeon: [name], MD;  Location: [facility];  Service: Gynecology;  Laterality: N/A;  . CHOLECYSTECTOMY N/A 03/27/2017   Procedure: LAPAROSCOPIC CHOLECYSTECTOMY;  Surgeon: Olean Ree, MD;  Location: [facility];  Service: General;  Laterality: N/A;  . COLONOSCOPY WITH PROPOFOL N/A 11/22/2016   Procedure: COLONOSCOPY WITH PROPOFOL;  Surgeon: Robert Bellow, MD;  Location: [facility];  Service: Endoscopy;  Laterality: N/A;  . FRACTURE SURGERY    . LAPAROSCOPY N/A 08/24/2015   Procedure: LAPAROSCOPY DIAGNOSTIC;  Surgeon: [name], MD;  Location: [facility];  Service: Gynecology;  Laterality: N/A;  . LEEP  [id number]   [facility]  . [place]  . SALPINGOOPHORECTOMY Bilateral 10/05/2015   Procedure: SALPINGO OOPHORECTOMY;  Surgeon: [name], MD;  Location: [facility];  Service: Gynecology;  Laterality: Bilateral;    Family History  Problem Relation Age of Onset  . Lung cancer Mother   . Cancer Mother   . Lung cancer Father   . Cancer Father   . Kidney Stones Daughter   . Asthma Son   . Breast cancer Paternal Aunt 87  . Diabetes Paternal Grandmother   . Thyroid disease Sister     Social History   Socioeconomic History  . Marital status: Married    Spouse name: Marden Noble  . Number of children: 2  . Years of education: Not on file  . Highest education level: Associate degree: academic program  Occupational History  . Occupation: Office manager: [employer]  Tobacco Use  . Smoking status: Never Smoker  . Smokeless tobacco: Never Used  Substance and Sexual Activity  . Alcohol use: No    Alcohol/week: 0.0 standard drinks  . Drug use: Never  . Sexual activity: Yes    Partners: Male    Birth control/protection: None    Comment: Hysterectomy  Other Topics Concern  . Not on file  Social History Narrative   Two grown children, married   Works at [employer] Strain: [place]   . Difficulty of Paying Living Expenses: Not hard at all  Food Insecurity: No Food Insecurity  . Worried About Charity fundraiser in the Last Year: Never true  . Ran Out of Food in the Last Year: Never true  Transportation Needs: No Transportation Needs  . Lack of Transportation (Medical): No  . Lack of Transportation (Non-Medical): No  Physical Activity:  Sufficiently Active  . Days of Exercise per Week: 3 days  . Minutes of Exercise per Session: 60 min  Stress: No Stress Concern Present  . Feeling of Stress : Not at all  Social Connections: Not Isolated  . Frequency of Communication with Friends and Family: More than three times a week  . Frequency of Social Gatherings with Friends and Family: More than three times a week  . Attends Religious Services: More than 4 times per year  . Active Member of Clubs or Organizations: Yes  . Attends Archivist Meetings: More than 4 times per year  . Marital Status: Married  Human resources officer Violence: Not At Risk  . Fear of Current or Ex-Partner: No  . Emotionally Abused: No  . Physically Abused:  No  . Sexually Abused: No     Current Outpatient Medications:  .  DULoxetine (CYMBALTA) 60 MG capsule, Take 1 capsule (60 mg total) by mouth daily., Disp: 90 capsule, Rfl: 1 .  estradiol (ESTRACE) 0.5 MG tablet, TAKE 1 TAB BY MOUTH DAIYLY, Disp: 90 tablet, Rfl: 1 .  levothyroxine (SYNTHROID, LEVOTHROID) 112 MCG tablet, Take 1 tablet (112 mcg total) by mouth daily before breakfast., Disp: 90 tablet, Rfl: 1 .  pregabalin (LYRICA) 75 MG capsule, Take 1 capsule (75 mg total) by mouth 2 (two) times daily., Disp: 180 capsule, Rfl: 1 .  Vitamin D, Ergocalciferol, (DRISDOL) 1.25 MG (50000 UNIT) CAPS capsule, Take 1 capsule (50,000 Units total) by mouth once a week., Disp: 12 capsule, Rfl: 1  No Known Allergies  I personally reviewed active problem list, medication list, allergies, family history, social history with the patient/caregiver today.   ROS  Ten systems reviewed and is negative except as mentioned in HPI   Objective  Virtual encounter, vitals not obtained.  There is no height or weight on file to calculate BMI.  Physical Exam  Awake, alert and oriented  PHQ2/9: Depression screen [facility] 2/9 02/27/2020 08/30/2019 01/23/2019 11/19/2018 07/19/2018  Decreased Interest 0 0 0 0 0  Down,  Depressed, Hopeless 0 0 0 0 0  PHQ - 2 Score 0 0 0 0 0  Altered sleeping 0 2 2 0 0  Tired, decreased energy 0 2 3 0 1  Change in appetite 0 1 0 0 0  Feeling bad or failure about yourself  0 0 0 0 0  Trouble concentrating 0 0 0 0 0  Moving slowly or fidgety/restless 0 0 0 0 0  Suicidal thoughts 0 0 0 0 0  PHQ-9 Score 0 5 5 0 1  Difficult doing work/chores - Not difficult at all Not difficult at all Not difficult at all -   PHQ-2/9 Result is negative.    Fall Risk: Fall Risk  08/30/2019 01/23/2019 11/19/2018 07/19/2018 06/15/2018  Falls in the past year? 0 1 0 No No  Number falls in past yr: 0 1 0 - -  Injury with Fall? 0 0 0 - -  Risk for fall due to : - Impaired balance/gait - - -     Assessment & Plan  1. Vitamin D deficiency  - Vitamin D, Ergocalciferol, (DRISDOL) 1.25 MG (50000 UNIT) CAPS capsule; Take 1 capsule (50,000 Units total) by mouth once a week.  Dispense: 12 capsule; Refill: 1  2. Fibromyalgia  - pregabalin (LYRICA) 75 MG capsule; Take 1 capsule (75 mg total) by mouth 2 (two) times daily.  Dispense: 180 capsule; Refill: 1 - DULoxetine (CYMBALTA) 60 MG capsule; Take 1 capsule (60 mg total) by mouth daily.  Dispense: 90 capsule; Refill: 1  3. CFIDS (chronic fatigue and immune dysfunction syndrome) ([place])  Doing better since working from home  4. Prediabetes   5. Dyslipidemia  Low ASCVD, continue life style modification   6. Adult hypothyroidism  Under the care of Dr. Gabriel Carina   7. Low vitamin B12 level  Advised B12 a few times a week   8. Vertigo  Improving   I discussed the assessment and treatment plan with the patient. The patient was provided an opportunity to ask questions and all were answered. The patient agreed with the plan and demonstrated an understanding of the instructions.   The patient was advised to call back or seek an in-person evaluation if the symptoms worsen or if  the condition fails to improve as anticipated.  I provided 25  minutes  of non-face-to-face time during this encounter.  Loistine Chance, MD

## 2020-02-28 ENCOUNTER — Encounter: Payer: Self-pay | Admitting: Family Medicine

## 2020-04-10 ENCOUNTER — Other Ambulatory Visit: Payer: Self-pay | Admitting: Family Medicine

## 2020-04-10 DIAGNOSIS — Z1231 Encounter for screening mammogram for malignant neoplasm of breast: Secondary | ICD-10-CM

## 2020-04-14 ENCOUNTER — Other Ambulatory Visit: Payer: Self-pay

## 2020-04-14 ENCOUNTER — Ambulatory Visit
Admission: RE | Admit: 2020-04-14 | Discharge: 2020-04-14 | Disposition: A | Discharge status: Home / Self Care | Payer: Managed Care, Other (non HMO) | Source: Ambulatory Visit | Attending: Family Medicine | Admitting: Family Medicine

## 2020-04-14 DIAGNOSIS — Z1231 Encounter for screening mammogram for malignant neoplasm of breast: Secondary | ICD-10-CM | POA: Diagnosis present

## 2020-04-14 HISTORY — DX: Malignant (primary) neoplasm, unspecified: C80.1

## 2020-07-19 ENCOUNTER — Encounter: Payer: Self-pay | Admitting: Family Medicine

## 2020-09-01 NOTE — Progress Notes (Signed)
Name: Monica Hardin   MRN: [id number]    DOB: 04/02/1966   Date:09/02/2020       Progress Note  Subjective  Chief Complaint  Chief Complaint  Patient presents with  . Annual Exam    HPI  Patient presents for annual CPE.  FMS: she saw Dr. Jefferson Fuel back in 05/2018 and was given formal diagnosis of FMS. She started on Duloxetine and states painhas improved since, still feels very achy and legs are heavy at the end of the day,we added Lyrica in January  2020 and she states the combination has helped with symptoms. She continues to have body aches and stiffness. Today she feels stiff, she also has flares that causes her to feel more aching two to three times a year, episodes of flares not as frequent. She states currently 6/10 more stiff than painful.   Hypothyroidism:she has been on levothyroxine for years, TPO positive and she was seen by Dr. Gabriel Carina who is prescribing the levothyroxine 125  mcg daily - dose adjusted back in July and we will recheck level  No hair loss, no constipation, she has chronic dry skin  Pre-diabetes: last hgbA1C was 5.5%with change of life style,weight went up a little,  no polyphagia, polydipsia or polyuria.She states she recently had labs done at work and it went up, she would like to have it rechecked   Dyslipidemia; not on medication,on life style modification only.We will recheck labs   The 10-year ASCVD risk score Mikey Bussing DC Jr., et al., 2013) is: 1.2%   Values used to calculate the score:     Age: 54 years     Sex: Female     Is Non-Hispanic African American: No     Diabetic: No     Tobacco smoker: No     Systolic Blood Pressure: 903 mmHg     Is BP treated: No     HDL Cholesterol: 59 mg/dL     Total Cholesterol: 226 mg/dL  Chronic fatigue: she is able to work a full time job, currently working from home and has helped her , she is going to the gym four  times a week for at least 45  minutes . She states she still goes to bed early - between  9-9:30 and wakes up between 5:30 -6 am. She states her sleep has not been as good, able to fall asleep but not staying asleep, she has been taking melatonin, discussed trying Trazodone   Ear fullness: woke up two days ago with hearing difficulty on left side, she is worried it is wax, doing better today   Diet: trying to avoid gluten  Exercise: 45 minutes 4 times a week.     Office Visit from 08/30/2019 in [facility]  AUDIT-C Score 0     Depression: Phq 9 is  negative Depression screen [facility] 2/9 09/02/2020 02/27/2020 08/30/2019 01/23/2019 11/19/2018  Decreased Interest 0 0 0 0 0  Down, Depressed, Hopeless 0 0 0 0 0  PHQ - 2 Score 0 0 0 0 0  Altered sleeping - 0 2 2 0  Tired, decreased energy - 0 2 3 0  Change in appetite - 0 1 0 0  Feeling bad or failure about yourself  - 0 0 0 0  Trouble concentrating - 0 0 0 0  Moving slowly or fidgety/restless - 0 0 0 0  Suicidal thoughts - 0 0 0 0  PHQ-9 Score - 0 5 5 0  Difficult doing  work/chores - - Not difficult at all Not difficult at all Not difficult at all   Hypertension: BP Readings from Last 3 Encounters:  09/02/20 100/72  08/30/19 108/62  01/23/19 136/82   Obesity: Wt Readings from Last 3 Encounters:  09/02/20 181 lb 14.4 oz (82.5 kg)  08/30/19 177 lb 12.8 oz (80.6 kg)  01/23/19 176 lb 11.2 oz (80.2 kg)   BMI Readings from Last 3 Encounters:  09/02/20 30.27 kg/m  08/30/19 29.59 kg/m  01/23/19 29.40 kg/m     Vaccines:  Shingrix: 67-64 yo and ask insurance if covered when patient above 33 yo Pneumonia: educated and discussed with patient. Flu:  educated and discussed with patient.- she received it today   Hep C Screening: Today STD testing and prevention (HIV/chl/gon/syphilis): not interested  Intimate partner violence: negative screen  Sexual History : one partner, no pain, denies vaginal dryness  Menstrual History/LMP/Abnormal Bleeding: history of hysterectomy - complete  Incontinence Symptoms: she  has some stress incontinence   Breast cancer:  - Last Mammogram: 03/2020  - BRCA gene screening: 3 aunts had breast cancer on father's side , she is not interested at this time   Osteoporosis: Discussed high calcium and vitamin D supplementation, weight bearing exercises  Cervical cancer screening: not indicated   Skin cancer: Discussed monitoring for atypical lesions, she sees Dr. Phillip Heal yearly   Colorectal cancer: repeat in 2027  Lung cancer:   Low Dose CT Chest recommended if Age 87-80 years, 37 pack-year currently smoking OR have quit w/in 15years. Patient does not qualify.   ECG: 2019   Advanced Care Planning: A voluntary discussion about advance care planning including the explanation and discussion of advance directives.  Discussed health care proxy and Living will, and the patient was able to identify a health care proxy as husband .  Patient does have a living will at present time. If patient does have living will, I have requested they bring this to the clinic to be scanned in to their chart.  Lipids: Lab Results  Component Value Date   CHOL 226 (H) 11/08/2019   CHOL 238 (H) 04/25/2018   CHOL 215 (H) 10/31/2017   Lab Results  Component Value Date   HDL 59 11/08/2019   HDL 62 04/25/2018   HDL 59 10/31/2017   Lab Results  Component Value Date   LDLCALC 148 (H) 11/08/2019   LDLCALC 144 (H) 04/25/2018   LDLCALC 131 (H) 10/31/2017   Lab Results  Component Value Date   TRIG 109 11/08/2019   TRIG 160 (H) 04/25/2018   TRIG 123 10/31/2017   Lab Results  Component Value Date   CHOLHDL 3.8 11/08/2019   CHOLHDL 3.8 04/25/2018   CHOLHDL 3.6 10/31/2017   No results found for: LDLDIRECT  Glucose: Glucose  Date Value Ref Range Status  11/08/2019 95 65 - 99 mg/dL Final  11/19/2018 79 65 - 99 mg/dL Final  04/25/2018 84 65 - 99 mg/dL Final   Glucose, Bld  Date Value Ref Range Status  03/27/2017 114 (H) 65 - 99 mg/dL Final  03/26/2017 123 (H) 65 - 99 mg/dL Final     Patient Active Problem List   Diagnosis Date Noted  . Primary osteoarthritis of both hands 06/15/2018  . Primary osteoarthritis of both knees 06/15/2018  . Primary osteoarthritis of both feet 06/15/2018  . Positive ANA (antinuclear antibody) 06/15/2018  . Fibromyalgia 06/15/2018  . Encounter for screening colonoscopy 11/07/2016  . Surgical menopause on hormone replacement therapy 09/01/2016  .  Vitamin D deficiency 04/20/2016  . Low vitamin B12 level 04/20/2016  . History of uterine leiomyoma 04/19/2016  . Sciatica, right side 04/19/2016  . Status post TAH-BSO 11/12/2015  . History of ovarian cyst 11/12/2015  . Dyslipidemia 07/04/2015  . Adult hypothyroidism 07/04/2015  . Excess weight 07/04/2015  . Disturbance of skin sensation 07/04/2015  . Lipoma of arm 12/03/2014  . CFIDS (chronic fatigue and immune dysfunction syndrome) ([place]) 05/08/2007    Past Surgical History:  Procedure Laterality Date  . ABDOMINAL HYSTERECTOMY N/A 10/05/2015   Procedure: HYSTERECTOMY ABDOMINAL;  Surgeon: [name], MD;  Location: [facility] ORS;  Service: Gynecology;  Laterality: N/A;  . CHOLECYSTECTOMY N/A 03/27/2017   Procedure: LAPAROSCOPIC CHOLECYSTECTOMY;  Surgeon: Olean Ree, MD;  Location: [facility] ORS;  Service: General;  Laterality: N/A;  . COLONOSCOPY WITH PROPOFOL N/A 11/22/2016   Procedure: COLONOSCOPY WITH PROPOFOL;  Surgeon: Robert Bellow, MD;  Location: [facility] ENDOSCOPY;  Service: Endoscopy;  Laterality: N/A;  . FRACTURE SURGERY    . LAPAROSCOPY N/A 08/24/2015   Procedure: LAPAROSCOPY DIAGNOSTIC;  Surgeon: [name], MD;  Location: [facility] ORS;  Service: Gynecology;  Laterality: N/A;  . LEEP  [id number]   [facility]  . [place]  . SALPINGOOPHORECTOMY Bilateral 10/05/2015   Procedure: SALPINGO OOPHORECTOMY;  Surgeon: [name], MD;  Location: [facility] ORS;  Service: Gynecology;  Laterality: Bilateral;    Family History  Problem Relation Age of Onset   . Lung cancer Mother   . Cancer Mother   . Lung cancer Father   . Cancer Father   . Kidney Stones Daughter   . Asthma Son   . Breast cancer Paternal Aunt 60  . Diabetes Paternal Grandmother   . Thyroid disease Sister     Social History   Socioeconomic History  . Marital status: Married    Spouse name: Marden Noble  . Number of children: 2  . Years of education: Not on file  . Highest education level: Associate degree: academic program  Occupational History  . Occupation: Office manager: [employer]  Tobacco Use  . Smoking status: Never Smoker  . Smokeless tobacco: Never Used  Vaping Use  . Vaping Use: Never used  Substance and Sexual Activity  . Alcohol use: No    Alcohol/week: 0.0 standard drinks  . Drug use: Never  . Sexual activity: Yes    Partners: Male    Birth control/protection: None    Comment: Hysterectomy  Other Topics Concern  . Not on file  Social History Narrative   Two grown children, married   Works at [employer] Strain: [place]   . Difficulty of Paying Living Expenses: Not hard at all  Food Insecurity: No Food Insecurity  . Worried About Charity fundraiser in the Last Year: Never true  . Ran Out of Food in the Last Year: Never true  Transportation Needs: No Transportation Needs  . Lack of Transportation (Medical): No  . Lack of Transportation (Non-Medical): No  Physical Activity: Insufficiently Active  . Days of Exercise per Week: 3 days  . Minutes of Exercise per Session: 30 min  Stress: No Stress Concern Present  . Feeling of Stress : Only a little  Social Connections: Socially Integrated  . Frequency of Communication with Friends and Family: More than three times a week  . Frequency of Social Gatherings with Friends and Family: Three times a week  .  Attends Religious Services: More than 4 times per year  . Active Member of Clubs or Organizations: Yes  . Attends Archivist  Meetings: 1 to 4 times per year  . Marital Status: Married  Human resources officer Violence: Not At Risk  . Fear of Current or Ex-Partner: No  . Emotionally Abused: No  . Physically Abused: No  . Sexually Abused: No     Current Outpatient Medications:  .  DULoxetine (CYMBALTA) 60 MG capsule, Take 1 capsule (60 mg total) by mouth daily., Disp: 90 capsule, Rfl: 1 .  estradiol (ESTRACE) 0.5 MG tablet, TAKE 1 TAB BY MOUTH DAIYLY, Disp: 90 tablet, Rfl: 1 .  levothyroxine (SYNTHROID) 125 MCG tablet, Take 125 mcg by mouth daily., Disp: , Rfl:  .  pregabalin (LYRICA) 75 MG capsule, Take 1 capsule (75 mg total) by mouth 2 (two) times daily., Disp: 180 capsule, Rfl: 1 .  Vitamin D, Ergocalciferol, (DRISDOL) 1.25 MG (50000 UNIT) CAPS capsule, Take 1 capsule (50,000 Units total) by mouth once a week., Disp: 12 capsule, Rfl: 1  No Known Allergies   ROS  Constitutional: Negative for fever or weight change.  Respiratory: Negative for cough and shortness of breath.   Cardiovascular: Negative for chest pain or palpitations.  Gastrointestinal: Negative for abdominal pain, no bowel changes.  Musculoskeletal: Negative for gait problem or joint swelling.  Skin: Negative for rash.  Neurological: Negative for dizziness or headache.  No other specific complaints in a complete review of systems (except as listed in HPI above).  Objective  Vitals:   09/02/20 0814  BP: 100/72  Pulse: 77  Resp: 16  Temp: 98.2 F (36.8 C)  TempSrc: Oral  SpO2: 99%  Weight: 181 lb 14.4 oz (82.5 kg)  Height: _0  (1.651 m)    Body mass index is 30.27 kg/m.  Physical Exam  Constitutional: Patient appears well-developed and well-nourished. No distress.  HENT: Head: Normocephalic and atraumatic. Ears: B TMs ok, no erythema or effusion; Nose: Nose normal. Mouth/Throat: Oropharynx is clear and moist. No oropharyngeal exudate.  Eyes: Conjunctivae and EOM are normal. Pupils are equal, round, and reactive to light. No  scleral icterus.  Neck: Normal range of motion. Neck supple. No JVD present. No thyromegaly present.  Cardiovascular: Normal rate, regular rhythm and normal heart sounds.  No murmur heard. No BLE edema. Pulmonary/Chest: Effort normal and breath sounds normal. No respiratory distress. Abdominal: Soft. Bowel sounds are normal, no distension. There is no tenderness. no masses Breast: no lumps or masses, no nipple discharge or rashes FEMALE GENITALIA:  Not done RECTAL: not done  Musculoskeletal: Normal range of motion, no joint effusions. No gross deformities Neurological: he is alert and oriented to person, place, and time. No cranial nerve deficit. Coordination, balance, strength, speech and gait are normal.  Skin: Skin is warm and dry. No rash noted. No erythema.  Psychiatric: Patient has a normal mood and affect. behavior is normal. Judgment and thought content normal.  Fall Risk: Fall Risk  09/02/2020 08/30/2019 01/23/2019 11/19/2018 07/19/2018  Falls in the past year? 0 0 1 0 No  Number falls in past yr: 0 0 1 0 -  Injury with Fall? 0 0 0 0 -  Risk for fall due to : - - Impaired balance/gait - -     Functional Status Survey: Is the patient deaf or have difficulty hearing?: No Does the patient have difficulty seeing, even when wearing glasses/contacts?: No Does the patient have difficulty concentrating, remembering, or making  decisions?: No Does the patient have difficulty walking or climbing stairs?: No Does the patient have difficulty dressing or bathing?: No Does the patient have difficulty doing errands alone such as visiting a doctor's office or shopping?: No   Assessment & Plan  1. CFIDS (chronic fatigue and immune dysfunction syndrome) ([place])   2. Need for immunization against influenza  - Flu Vaccine QUAD 36+ mos IM  3. Well adult exam  - CBC with Differential/Platelet - Comprehensive metabolic panel  4. Need for hepatitis C screening test  - Hepatitis C  antibody  5. Low vitamin B12 level  - Vitamin B12  6. Dyslipidemia   7. Hypothyroidism due to Hashimoto's thyroiditis  - TSH  8. Prediabetes  - Hemoglobin A1c  9. Vitamin D deficiency  - VITAMIN D 25 Hydroxy (Vit-D Deficiency, Fractures)  10. Elevated antinuclear antibody (ANA) level  - Sedimentation rate - C-reactive protein - ANA,IFA RA Diag Pnl w/rflx Tit/Patn - IgG, IgA, IgM  11. Fibromyalgia   12. Lipid screening  - Lipid panel  -USPSTF grade A and B recommendations reviewed with patient; age-appropriate recommendations, preventive care, screening tests, etc discussed and encouraged; healthy living encouraged; see AVS for patient education given to patient -Discussed importance of 150 minutes of physical activity weekly, eat two servings of fish weekly, eat one serving of tree nuts ( cashews, pistachios, pecans, almonds.Marland Kitchen) every other day, eat 6 servings of fruit/vegetables daily and drink plenty of water and avoid sweet beverages.

## 2020-09-02 ENCOUNTER — Ambulatory Visit (INDEPENDENT_AMBULATORY_CARE_PROVIDER_SITE_OTHER): Payer: Managed Care, Other (non HMO) | Admitting: Family Medicine

## 2020-09-02 ENCOUNTER — Other Ambulatory Visit: Payer: Self-pay

## 2020-09-02 ENCOUNTER — Encounter: Payer: Self-pay | Admitting: Family Medicine

## 2020-09-02 VITALS — BP 100/72 | HR 77 | Temp 98.2°F | Resp 16 | Ht 65.0 in | Wt 181.9 lb

## 2020-09-02 DIAGNOSIS — R5382 Chronic fatigue, unspecified: Secondary | ICD-10-CM

## 2020-09-02 DIAGNOSIS — D8989 Other specified disorders involving the immune mechanism, not elsewhere classified: Secondary | ICD-10-CM

## 2020-09-02 DIAGNOSIS — R768 Other specified abnormal immunological findings in serum: Secondary | ICD-10-CM

## 2020-09-02 DIAGNOSIS — E538 Deficiency of other specified B group vitamins: Secondary | ICD-10-CM | POA: Diagnosis not present

## 2020-09-02 DIAGNOSIS — Z23 Encounter for immunization: Secondary | ICD-10-CM | POA: Diagnosis not present

## 2020-09-02 DIAGNOSIS — Z13220 Encounter for screening for lipoid disorders: Secondary | ICD-10-CM

## 2020-09-02 DIAGNOSIS — Z0000 Encounter for general adult medical examination without abnormal findings: Secondary | ICD-10-CM

## 2020-09-02 DIAGNOSIS — E559 Vitamin D deficiency, unspecified: Secondary | ICD-10-CM

## 2020-09-02 DIAGNOSIS — R232 Flushing: Secondary | ICD-10-CM

## 2020-09-02 DIAGNOSIS — G4700 Insomnia, unspecified: Secondary | ICD-10-CM

## 2020-09-02 DIAGNOSIS — E038 Other specified hypothyroidism: Secondary | ICD-10-CM

## 2020-09-02 DIAGNOSIS — Z1159 Encounter for screening for other viral diseases: Secondary | ICD-10-CM

## 2020-09-02 DIAGNOSIS — E785 Hyperlipidemia, unspecified: Secondary | ICD-10-CM

## 2020-09-02 DIAGNOSIS — E063 Autoimmune thyroiditis: Secondary | ICD-10-CM

## 2020-09-02 DIAGNOSIS — R7989 Other specified abnormal findings of blood chemistry: Secondary | ICD-10-CM

## 2020-09-02 DIAGNOSIS — R7303 Prediabetes: Secondary | ICD-10-CM

## 2020-09-02 DIAGNOSIS — G9332 Myalgic encephalomyelitis/chronic fatigue syndrome: Secondary | ICD-10-CM

## 2020-09-02 DIAGNOSIS — M797 Fibromyalgia: Secondary | ICD-10-CM

## 2020-09-02 DIAGNOSIS — R7689 Other specified abnormal immunological findings in serum: Secondary | ICD-10-CM

## 2020-09-02 MED ORDER — PREGABALIN 75 MG PO CAPS: 75.0000 mg | ORAL_CAPSULE | Freq: Two times a day (BID) | ORAL | 1 refills | Status: DC

## 2020-09-02 MED ORDER — DULOXETINE HCL 60 MG PO CPEP: 60.0000 mg | ORAL_CAPSULE | Freq: Every day | ORAL | 1 refills | Status: DC

## 2020-09-02 MED ORDER — B-12 1000 MCG SL SUBL: 1.0000 | SUBLINGUAL_TABLET | SUBLINGUAL | 0 refills | Status: DC

## 2020-09-02 MED ORDER — VITAMIN D (ERGOCALCIFEROL) 1.25 MG (50000 UNIT) PO CAPS: 50000.0000 [IU] | ORAL_CAPSULE | ORAL | 1 refills | Status: DC

## 2020-09-02 MED ORDER — ESTRADIOL 0.5 MG PO TABS: 0.5000 mg | ORAL_TABLET | Freq: Every day | ORAL | 1 refills | Status: DC

## 2020-09-02 MED ORDER — TRAZODONE HCL 50 MG PO TABS: 25.0000 mg | ORAL_TABLET | Freq: Every evening | ORAL | 1 refills | Status: DC | PRN

## 2020-09-02 NOTE — Patient Instructions (Signed)
Check coverage of shingrix vaccine   Preventive Care 43-55 Years Old, Female Preventive care refers to visits with your health care provider and lifestyle choices that can promote health and wellness. This includes:  A yearly physical exam. This may also be called an annual well check.  Regular dental visits and eye exams.  Immunizations.  Screening for certain conditions.  Healthy lifestyle choices, such as eating a healthy diet, getting regular exercise, not using drugs or products that contain nicotine and tobacco, and limiting alcohol use. What can I expect for my preventive care visit? Physical exam Your health care provider will check your:  Height and weight. This may be used to calculate body mass index (BMI), which tells if you are at a healthy weight.  Heart rate and blood pressure.  Skin for abnormal spots. Counseling Your health care provider may ask you questions about your:  Alcohol, tobacco, and drug use.  Emotional well-being.  Home and relationship well-being.  Sexual activity.  Eating habits.  Work and work Statistician.  Method of birth control.  Menstrual cycle.  Pregnancy history. What immunizations do I need?  Influenza (flu) vaccine  This is recommended every year. Tetanus, diphtheria, and pertussis (Tdap) vaccine  You may need a Td booster every 10 years. Varicella (chickenpox) vaccine  You may need this if you have not been vaccinated. Zoster (shingles) vaccine  You may need this after age 34. Measles, mumps, and rubella (MMR) vaccine  You may need at least one dose of MMR if you were born in 1957 or later. You may also need a second dose. Pneumococcal conjugate (PCV13) vaccine  You may need this if you have certain conditions and were not previously vaccinated. Pneumococcal polysaccharide (PPSV23) vaccine  You may need one or two doses if you smoke cigarettes or if you have certain conditions. Meningococcal conjugate (MenACWY)  vaccine  You may need this if you have certain conditions. Hepatitis A vaccine  You may need this if you have certain conditions or if you travel or work in places where you may be exposed to hepatitis A. Hepatitis B vaccine  You may need this if you have certain conditions or if you travel or work in places where you may be exposed to hepatitis B. Haemophilus influenzae type b (Hib) vaccine  You may need this if you have certain conditions. Human papillomavirus (HPV) vaccine  If recommended by your health care provider, you may need three doses over 6 months. You may receive vaccines as individual doses or as more than one vaccine together in one shot (combination vaccines). Talk with your health care provider about the risks and benefits of combination vaccines. What tests do I need? Blood tests  Lipid and cholesterol levels. These may be checked every 5 years, or more frequently if you are over 69 years old.  Hepatitis C test.  Hepatitis B test. Screening  Lung cancer screening. You may have this screening every year starting at age 46 if you have a 30-pack-year history of smoking and currently smoke or have quit within the past 15 years.  Colorectal cancer screening. All adults should have this screening starting at age 41 and continuing until age 6. Your health care provider may recommend screening at age 69 if you are at increased risk. You will have tests every 1-10 years, depending on your results and the type of screening test.  Diabetes screening. This is done by checking your blood sugar (glucose) after you have not eaten for  a while (fasting). You may have this done every 1-3 years.  Mammogram. This may be done every 1-2 years. Talk with your health care provider about when you should start having regular mammograms. This may depend on whether you have a family history of breast cancer.  BRCA-related cancer screening. This may be done if you have a family history of  breast, ovarian, tubal, or peritoneal cancers.  Pelvic exam and Pap test. This may be done every 3 years starting at age 76. Starting at age 41, this may be done every 5 years if you have a Pap test in combination with an HPV test. Other tests  Sexually transmitted disease (STD) testing.  Bone density scan. This is done to screen for osteoporosis. You may have this scan if you are at high risk for osteoporosis. Follow these instructions at home: Eating and drinking  Eat a diet that includes fresh fruits and vegetables, whole grains, lean protein, and low-fat dairy.  Take vitamin and mineral supplements as recommended by your health care provider.  Do not drink alcohol if: ? Your health care provider tells you not to drink. ? You are pregnant, may be pregnant, or are planning to become pregnant.  If you drink alcohol: ? Limit how much you have to 0-1 drink a day. ? Be aware of how much alcohol is in your drink. In the [place] one drink equals one 12 oz bottle of beer (355 mL), one 5 oz glass of wine (148 mL), or one 1 oz glass of hard liquor (44 mL). Lifestyle  Take daily care of your teeth and gums.  Stay active. Exercise for at least 30 minutes on 5 or more days each week.  Do not use any products that contain nicotine or tobacco, such as cigarettes, e-cigarettes, and chewing tobacco. If you need help quitting, ask your health care provider.  If you are sexually active, practice safe sex. Use a condom or other form of birth control (contraception) in order to prevent pregnancy and STIs (sexually transmitted infections).  If told by your health care provider, take low-dose aspirin daily starting at age 26. What's next?  Visit your health care provider once a year for a well check visit.  Ask your health care provider how often you should have your eyes and teeth checked.  Stay up to date on all vaccines. This information is not intended to replace advice given to you by your  health care provider. Make sure you discuss any questions you have with your health care provider. Document Revised: 08/23/2018 Document Reviewed: 08/23/2018 Elsevier Patient Education  2020 [employer].

## 2020-09-21 ENCOUNTER — Encounter: Payer: Self-pay | Admitting: Family Medicine

## 2020-09-21 LAB — CBC WITH DIFFERENTIAL/PLATELET
Basophils Absolute: 0 10*3/uL (ref 0.0–0.2)
Basos: 0 %
EOS (ABSOLUTE): 0.1 10*3/uL (ref 0.0–0.4)
Eos: 2 %
Hematocrit: 43.6 % (ref 34.0–46.6)
Hemoglobin: 14.5 g/dL (ref 11.1–15.9)
Immature Grans (Abs): 0 10*3/uL (ref 0.0–0.1)
Immature Granulocytes: 0 %
Lymphocytes Absolute: 2 10*3/uL (ref 0.7–3.1)
Lymphs: 33 %
MCH: 30.9 pg (ref 26.6–33.0)
MCHC: 33.3 g/dL (ref 31.5–35.7)
MCV: 93 fL (ref 79–97)
Monocytes Absolute: 0.3 10*3/uL (ref 0.1–0.9)
Monocytes: 5 %
Neutrophils Absolute: 3.5 10*3/uL (ref 1.4–7.0)
Neutrophils: 60 %
Platelets: 317 10*3/uL (ref 150–450)
RBC: 4.69 x10E6/uL (ref 3.77–5.28)
RDW: 12.3 % (ref 11.7–15.4)
WBC: 5.9 10*3/uL (ref 3.4–10.8)

## 2020-09-21 LAB — COMPREHENSIVE METABOLIC PANEL
ALT: 24 IU/L (ref 0–32)
AST: 18 IU/L (ref 0–40)
Albumin/Globulin Ratio: 1.8 (ref 1.2–2.2)
Albumin: 4.7 g/dL (ref 3.8–4.9)
Alkaline Phosphatase: 95 IU/L (ref 44–121)
BUN/Creatinine Ratio: 14 (ref 9–23)
BUN: 12 mg/dL (ref 6–24)
Bilirubin Total: 0.3 mg/dL (ref 0.0–1.2)
CO2: 25 mmol/L (ref 20–29)
Calcium: 9.3 mg/dL (ref 8.7–10.2)
Chloride: 101 mmol/L (ref 96–106)
Creatinine, Ser: 0.87 mg/dL (ref 0.57–1.00)
GFR calc Af Amer: 87 mL/min/{1.73_m2} (ref >59)
GFR calc non Af Amer: 76 mL/min/{1.73_m2} (ref >59)
Globulin, Total: 2.6 g/dL (ref 1.5–4.5)
Glucose: 98 mg/dL (ref 65–99)
Potassium: 4.4 mmol/L (ref 3.5–5.2)
Sodium: 140 mmol/L (ref 134–144)
Total Protein: 7.3 g/dL (ref 6.0–8.5)

## 2020-09-21 LAB — IGG, IGA, IGM
IgA/Immunoglobulin A, Serum: 124 mg/dL (ref 87–352)
IgG (Immunoglobin G), Serum: 1019 mg/dL (ref 586–1602)
IgM (Immunoglobulin M), Srm: 323 mg/dL — ABNORMAL HIGH (ref 26–217)

## 2020-09-21 LAB — HEMOGLOBIN A1C
Est. average glucose Bld gHb Est-mCnc: 108 mg/dL
Hgb A1c MFr Bld: 5.4 % (ref 4.8–5.6)

## 2020-09-21 LAB — LIPID PANEL
Chol/HDL Ratio: 5.2 ratio — ABNORMAL HIGH (ref 0.0–4.4)
Cholesterol, Total: 256 mg/dL — ABNORMAL HIGH (ref 100–199)
HDL: 49 mg/dL (ref >39)
LDL Chol Calc (NIH): 170 mg/dL — ABNORMAL HIGH (ref 0–99)
Triglycerides: 198 mg/dL — ABNORMAL HIGH (ref 0–149)
VLDL Cholesterol Cal: 37 mg/dL (ref 5–40)

## 2020-09-21 LAB — FANA STAINING PATTERNS: Speckled Pattern: 1:160 {titer} — ABNORMAL HIGH

## 2020-09-21 LAB — VITAMIN D 25 HYDROXY (VIT D DEFICIENCY, FRACTURES): Vit D, 25-Hydroxy: 59.4 ng/mL (ref 30.0–100.0)

## 2020-09-21 LAB — ANA,IFA RA DIAG PNL W/RFLX TIT/PATN
ANA Titer 1: POSITIVE — AB
Cyclic Citrullin Peptide Ab: 6 units (ref 0–19)
Rheumatoid fact SerPl-aCnc: <10 IU/mL (ref 0.0–13.9)

## 2020-09-21 LAB — TSH: TSH: 1.06 u[IU]/mL (ref 0.450–4.500)

## 2020-09-21 LAB — VITAMIN B12: Vitamin B-12: 475 pg/mL (ref 232–1245)

## 2020-09-21 LAB — C-REACTIVE PROTEIN: CRP: 10 mg/L (ref 0–10)

## 2020-09-21 LAB — HEPATITIS C ANTIBODY: Hep C Virus Ab: <0.1 s/co ratio (ref 0.0–0.9)

## 2020-09-21 LAB — SEDIMENTATION RATE: Sed Rate: 31 mm/hr (ref 0–40)

## 2020-10-01 ENCOUNTER — Other Ambulatory Visit: Payer: Self-pay | Admitting: Family Medicine

## 2020-10-01 DIAGNOSIS — M797 Fibromyalgia: Secondary | ICD-10-CM

## 2020-10-01 DIAGNOSIS — R768 Other specified abnormal immunological findings in serum: Secondary | ICD-10-CM

## 2020-10-01 DIAGNOSIS — R7982 Elevated C-reactive protein (CRP): Secondary | ICD-10-CM

## 2020-10-01 DIAGNOSIS — D8989 Other specified disorders involving the immune mechanism, not elsewhere classified: Secondary | ICD-10-CM

## 2020-10-13 ENCOUNTER — Telehealth: Payer: Self-pay

## 2020-10-13 ENCOUNTER — Other Ambulatory Visit: Payer: Self-pay

## 2020-10-13 DIAGNOSIS — M797 Fibromyalgia: Secondary | ICD-10-CM

## 2020-10-13 DIAGNOSIS — D8989 Other specified disorders involving the immune mechanism, not elsewhere classified: Secondary | ICD-10-CM

## 2020-10-13 NOTE — Telephone Encounter (Signed)
Copied from [place] [id number]. Topic: Referral - Status >> Oct 13, 2020  1:12 PM Lennox Solders wrote: Reason for CRM: Pt is checking on the status of the referral to dr Elnora Morrison at [facility] rheumatologist. They did not received the referral their phone number [phone] and fax number [phone]. Please fax the referral again

## 2020-10-13 NOTE — Telephone Encounter (Signed)
Referral was processed and sent on 10/07/2, but it was sent to the number listed in [place] [phone]).   I will resend it to the new number [phone]) but yes it does take time for them to review it and decide the urgency of when she needs to be seen.  Meanwhile, she is more than welcome to give their office a call to check the status of it.

## 2020-11-30 ENCOUNTER — Other Ambulatory Visit: Payer: Self-pay | Admitting: Family Medicine

## 2020-11-30 DIAGNOSIS — G4700 Insomnia, unspecified: Secondary | ICD-10-CM

## 2021-01-01 ENCOUNTER — Encounter: Payer: Self-pay | Admitting: Family Medicine

## 2021-01-01 DIAGNOSIS — G4700 Insomnia, unspecified: Secondary | ICD-10-CM

## 2021-01-04 MED ORDER — TRAZODONE HCL 50 MG PO TABS: 50.0000 mg | ORAL_TABLET | Freq: Every day | ORAL | 0 refills | Status: DC

## 2021-03-01 NOTE — Progress Notes (Signed)
Name: Monica Hardin   MRN: [id number]    DOB: July 06, 1966   Date:03/02/2021       Progress Note  Subjective  Chief Complaint  Follow  Up  HPI  FMS: she saw Dr. Jefferson Fuel back in 05/2018 and was given formal diagnosis of FMS. She started on Duloxetine years ago we added Lyrica in January  2020 and she states the combination was helping with symptoms. . She continues to have body aches and stiffness. She gets discourage about feeling tired all the time, having pain, brain fog. Discussed CBT but she wants to think about it.   Polyarthralgia: seen by Dr. Kathi Ludwig back in 2019 and ANA was high and also inflammatory markers, we have been following levels, she is going for a second opinion in [place] June 2022   Hypothyroidism:she has been on levothyroxine for years, TPO positive and she was seen by Dr. Gabriel Carina who is prescribing the levothyroxine , she is compliant with therapy. She has some hair loss, no change in bowel movements.   Pre-diabetes: last hgbA1C was 5.4%with change of life style,but weight continues to go up,  no polyphagia, polydipsia or polyuria.  Dyslipidemia; not on medication,on life style modification only.She has very high LDL at 170, she states not a high family history risk of heart attacks and strokes she has FMS and afraid of trying statin therapy   The 10-year ASCVD risk score Mikey Bussing DC Jr., et al., 2013) is: 2.2%   Values used to calculate the score:     Age: 55 years     Sex: Female     Is Non-Hispanic African American: No     Diabetic: No     Tobacco smoker: No     Systolic Blood Pressure: 834 mmHg     Is BP treated: No     HDL Cholesterol: 49 mg/dL     Total Cholesterol: 256 mg/dL   Chronic fatigue: she is able to work a full time job, currently working from home and has helped her , she is going to the gym about 3 times a week.  She states she still goes to bed early - between 9-9:30 and wakes up between 5:30 -6 am. She was falling asleep but not staying  asleep , we started her on Trazodone and it has improved her sleep pattern    Patient Active Problem List   Diagnosis Date Noted  . Primary osteoarthritis of both hands 06/15/2018  . Primary osteoarthritis of both knees 06/15/2018  . Primary osteoarthritis of both feet 06/15/2018  . Positive ANA (antinuclear antibody) 06/15/2018  . Fibromyalgia 06/15/2018  . Encounter for screening colonoscopy 11/07/2016  . Surgical menopause on hormone replacement therapy 09/01/2016  . Vitamin D deficiency 04/20/2016  . Low vitamin B12 level 04/20/2016  . History of uterine leiomyoma 04/19/2016  . Sciatica, right side 04/19/2016  . Status post TAH-BSO 11/12/2015  . History of ovarian cyst 11/12/2015  . Dyslipidemia 07/04/2015  . Adult hypothyroidism 07/04/2015  . Excess weight 07/04/2015  . Disturbance of skin sensation 07/04/2015  . Lipoma of arm 12/03/2014  . CFIDS (chronic fatigue and immune dysfunction syndrome) ([place]) 05/08/2007    Past Surgical History:  Procedure Laterality Date  . ABDOMINAL HYSTERECTOMY N/A 10/05/2015   Procedure: HYSTERECTOMY ABDOMINAL;  Surgeon: [name], MD;  Location: [facility];  Service: Gynecology;  Laterality: N/A;  . CHOLECYSTECTOMY N/A 03/27/2017   Procedure: LAPAROSCOPIC CHOLECYSTECTOMY;  Surgeon: Olean Ree, MD;  Location: [facility];  Service: General;  Laterality: N/A;  . COLONOSCOPY WITH PROPOFOL N/A 11/22/2016   Procedure: COLONOSCOPY WITH PROPOFOL;  Surgeon: Robert Bellow, MD;  Location: [facility];  Service: Endoscopy;  Laterality: N/A;  . FRACTURE SURGERY    . LAPAROSCOPY N/A 08/24/2015   Procedure: LAPAROSCOPY DIAGNOSTIC;  Surgeon: [name], MD;  Location: [facility];  Service: Gynecology;  Laterality: N/A;  . LEEP  [id number]   [facility]  . [place]  . SALPINGOOPHORECTOMY Bilateral 10/05/2015   Procedure: SALPINGO OOPHORECTOMY;  Surgeon: [name], MD;  Location: [facility];  Service: Gynecology;   Laterality: Bilateral;    Family History  Problem Relation Age of Onset  . Lung cancer Mother   . Cancer Mother   . Lung cancer Father   . Cancer Father   . Kidney Stones Daughter   . Asthma Son   . Breast cancer Paternal Aunt 51  . Diabetes Paternal Grandmother   . Thyroid disease Sister     Social History   Tobacco Use  . Smoking status: Never Smoker  . Smokeless tobacco: Never Used  Substance Use Topics  . Alcohol use: No    Alcohol/week: 0.0 standard drinks     Current Outpatient Medications:  .  Cyanocobalamin (B-12) 1000 MCG SUBL, Place 1 tablet under the tongue 3 (three) times a week., Disp: 90 tablet, Rfl: 0 .  DULoxetine (CYMBALTA) 60 MG capsule, Take 1 capsule (60 mg total) by mouth daily., Disp: 90 capsule, Rfl: 1 .  estradiol (ESTRACE) 0.5 MG tablet, Take 1 tablet (0.5 mg total) by mouth daily., Disp: 90 tablet, Rfl: 1 .  levothyroxine (SYNTHROID) 125 MCG tablet, Take 125 mcg by mouth daily., Disp: , Rfl:  .  pregabalin (LYRICA) 75 MG capsule, Take 1 capsule (75 mg total) by mouth 2 (two) times daily., Disp: 180 capsule, Rfl: 1 .  traZODone (DESYREL) 50 MG tablet, Take 1 tablet (50 mg total) by mouth at bedtime., Disp: 90 tablet, Rfl: 0 .  Vitamin D, Ergocalciferol, (DRISDOL) 1.25 MG (50000 UNIT) CAPS capsule, Take 1 capsule (50,000 Units total) by mouth once a week., Disp: 12 capsule, Rfl: 1  No Known Allergies  I personally reviewed active problem list, medication list, allergies, family history, social history, health maintenance, notes from last encounter with the patient/caregiver today.   ROS  Constitutional: Negative for fever , positive weight change.  Respiratory: Negative for cough and shortness of breath.   Cardiovascular: Negative for chest pain or palpitations.  Gastrointestinal: Negative for abdominal pain, no bowel changes.  Musculoskeletal: Negative for gait problem, positive for intermittent  joint swelling.  Skin: Negative for rash.   Neurological: Negative for dizziness or headache.  No other specific complaints in a complete review of systems (except as listed in HPI above).  Objective  Vitals:   03/02/21 0753  BP: 112/74  Pulse: 80  Resp: 16  Temp: 98 F (36.7 C)  TempSrc: Oral  SpO2: 99%  Weight: 188 lb (85.3 kg)  Height: 5\' 5"  (1.651 m)    Body mass index is 31.28 kg/m.  Physical Exam  Constitutional: Patient appears well-developed and well-nourished. Obese  No distress.  HEENT: head atraumatic, normocephalic, pupils equal and reactive to light,  neck supple Cardiovascular: Normal rate, regular rhythm and normal heart sounds.  No murmur heard. Trace BLE edema. Pulmonary/Chest: Effort normal and breath sounds normal. No respiratory distress. Abdominal: Soft.  There is no tenderness. Psychiatric: Patient has a normal mood and affect. behavior is normal.  Judgment and thought content normal.  PHQ2/9: Depression screen [facility] 2/9 03/02/2021 09/02/2020 02/27/2020 08/30/2019 01/23/2019  Decreased Interest 0 0 0 0 0  Down, Depressed, Hopeless 0 0 0 0 0  PHQ - 2 Score 0 0 0 0 0  Altered sleeping - - 0 2 2  Tired, decreased energy - - 0 2 3  Change in appetite - - 0 1 0  Feeling bad or failure about yourself  - - 0 0 0  Trouble concentrating - - 0 0 0  Moving slowly or fidgety/restless - - 0 0 0  Suicidal thoughts - - 0 0 0  PHQ-9 Score - - 0 5 5  Difficult doing work/chores - - - Not difficult at all Not difficult at all    phq 9 is negative   Fall Risk: Fall Risk  03/02/2021 09/02/2020 08/30/2019 01/23/2019 11/19/2018  Falls in the past year? 0 0 0 1 0  Number falls in past yr: 0 0 0 1 0  Injury with Fall? 0 0 0 0 0  Risk for fall due to : - - - Impaired balance/gait -    Functional Status Survey: Is the patient deaf or have difficulty hearing?: No Does the patient have difficulty seeing, even when wearing glasses/contacts?: No Does the patient have difficulty concentrating, remembering, or making  decisions?: Yes Does the patient have difficulty walking or climbing stairs?: No Does the patient have difficulty dressing or bathing?: No Does the patient have difficulty doing errands alone such as visiting a doctor's office or shopping?: No    Assessment & Plan  1. Fibromyalgia  - DULoxetine (CYMBALTA) 60 MG capsule; Take 1 capsule (60 mg total) by mouth daily.  Dispense: 90 capsule; Refill: 1 - pregabalin (LYRICA) 75 MG capsule; Take 1 capsule (75 mg total) by mouth 3 (three) times daily.  Dispense: 270 capsule; Refill: 1  We will try PA for provigil to improve mental fogginess and daily fatigue   2. CFIDS (chronic fatigue and immune dysfunction syndrome) ([facility])   3. Hypothyroidism due to Hashimoto's thyroiditis   4. Prediabetes   5. Vitamin D deficiency  - Vitamin D, Ergocalciferol, (DRISDOL) 1.25 MG (50000 UNIT) CAPS capsule; Take 1 capsule (50,000 Units total) by mouth once a week.  Dispense: 12 capsule; Refill: 1  6. Dyslipidemia  Discussed considering zeita  7. Elevated antinuclear antibody (ANA) level   8. Arthralgia, unspecified joint   9. Hot flashes  - estradiol (ESTRACE) 0.5 MG tablet; Take 1 tablet (0.5 mg total) by mouth daily.  Dispense: 90 tablet; Refill: 1  10. Insomnia, unspecified type  - traZODone (DESYREL) 50 MG tablet; Take 1 tablet (50 mg total) by mouth at bedtime.  Dispense: 90 tablet; Refill: 1  11. Low vitamin B12 level  - Cyanocobalamin (B-12) 1000 MCG SUBL; Place 1 tablet under the tongue 3 (three) times a week.  Dispense: 90 tablet; Refill: 1

## 2021-03-02 ENCOUNTER — Encounter: Payer: Self-pay | Admitting: Family Medicine

## 2021-03-02 ENCOUNTER — Ambulatory Visit: Payer: Managed Care, Other (non HMO) | Admitting: Family Medicine

## 2021-03-02 ENCOUNTER — Other Ambulatory Visit: Payer: Self-pay

## 2021-03-02 VITALS — BP 112/74 | HR 80 | Temp 98.0°F | Resp 16 | Ht 65.0 in | Wt 188.0 lb

## 2021-03-02 DIAGNOSIS — M2550 Pain in unspecified joint: Secondary | ICD-10-CM

## 2021-03-02 DIAGNOSIS — E063 Autoimmune thyroiditis: Secondary | ICD-10-CM

## 2021-03-02 DIAGNOSIS — E559 Vitamin D deficiency, unspecified: Secondary | ICD-10-CM

## 2021-03-02 DIAGNOSIS — E785 Hyperlipidemia, unspecified: Secondary | ICD-10-CM

## 2021-03-02 DIAGNOSIS — R5382 Chronic fatigue, unspecified: Secondary | ICD-10-CM

## 2021-03-02 DIAGNOSIS — E538 Deficiency of other specified B group vitamins: Secondary | ICD-10-CM

## 2021-03-02 DIAGNOSIS — E038 Other specified hypothyroidism: Secondary | ICD-10-CM | POA: Diagnosis not present

## 2021-03-02 DIAGNOSIS — R7303 Prediabetes: Secondary | ICD-10-CM

## 2021-03-02 DIAGNOSIS — R7989 Other specified abnormal findings of blood chemistry: Secondary | ICD-10-CM

## 2021-03-02 DIAGNOSIS — R232 Flushing: Secondary | ICD-10-CM

## 2021-03-02 DIAGNOSIS — D8989 Other specified disorders involving the immune mechanism, not elsewhere classified: Secondary | ICD-10-CM

## 2021-03-02 DIAGNOSIS — R768 Other specified abnormal immunological findings in serum: Secondary | ICD-10-CM

## 2021-03-02 DIAGNOSIS — G4700 Insomnia, unspecified: Secondary | ICD-10-CM

## 2021-03-02 DIAGNOSIS — M797 Fibromyalgia: Secondary | ICD-10-CM

## 2021-03-02 MED ORDER — MODAFINIL 100 MG PO TABS: 100.0000 mg | ORAL_TABLET | Freq: Every day | ORAL | 0 refills | Status: DC

## 2021-03-02 MED ORDER — VITAMIN D (ERGOCALCIFEROL) 1.25 MG (50000 UNIT) PO CAPS: 50000.0000 [IU] | ORAL_CAPSULE | ORAL | 1 refills | Status: DC

## 2021-03-02 MED ORDER — DULOXETINE HCL 60 MG PO CPEP
60.0000 mg | ORAL_CAPSULE | Freq: Every day | ORAL | 1 refills | Status: DC
Start: 2021-03-02 — End: 2021-09-07

## 2021-03-02 MED ORDER — ESTRADIOL 0.5 MG PO TABS
0.5000 mg | ORAL_TABLET | Freq: Every day | ORAL | 1 refills | Status: DC
Start: 2021-03-02 — End: 2021-09-07

## 2021-03-02 MED ORDER — PREGABALIN 75 MG PO CAPS: 75.0000 mg | ORAL_CAPSULE | Freq: Three times a day (TID) | ORAL | 1 refills | Status: DC

## 2021-03-02 MED ORDER — TRAZODONE HCL 50 MG PO TABS: 50.0000 mg | ORAL_TABLET | Freq: Every day | ORAL | 1 refills | Status: DC

## 2021-03-02 MED ORDER — B-12 1000 MCG SL SUBL: 1.0000 | SUBLINGUAL_TABLET | SUBLINGUAL | 1 refills | Status: DC

## 2021-04-05 ENCOUNTER — Other Ambulatory Visit: Payer: Self-pay | Admitting: Family Medicine

## 2021-04-05 DIAGNOSIS — M797 Fibromyalgia: Secondary | ICD-10-CM

## 2021-05-16 ENCOUNTER — Encounter: Payer: Self-pay | Admitting: Family Medicine

## 2021-07-06 ENCOUNTER — Other Ambulatory Visit: Payer: Self-pay | Admitting: Family Medicine

## 2021-07-06 DIAGNOSIS — Z1231 Encounter for screening mammogram for malignant neoplasm of breast: Secondary | ICD-10-CM

## 2021-07-07 ENCOUNTER — Ambulatory Visit
Admission: RE | Admit: 2021-07-07 | Discharge: 2021-07-07 | Disposition: A | Discharge status: Home / Self Care | Payer: Managed Care, Other (non HMO) | Source: Ambulatory Visit | Attending: Family Medicine | Admitting: Family Medicine

## 2021-07-07 ENCOUNTER — Other Ambulatory Visit: Payer: Self-pay

## 2021-07-07 DIAGNOSIS — Z1231 Encounter for screening mammogram for malignant neoplasm of breast: Secondary | ICD-10-CM | POA: Diagnosis not present

## 2021-09-06 NOTE — Progress Notes (Signed)
Name: Monica Hardin   MRN: [id number]    DOB: Nov 28, 1966   Date:09/07/2021       Progress Note  Subjective  Chief Complaint  Annual Exam  HPI  Patient presents for annual CPE and follow up   FMS: she saw Dr. Jefferson Fuel back in 05/2018 and was given formal diagnosis of FMS. She started on Duloxetine years ago we added Lyrica in January  2020 and she states the combination has been helping with symptoms. . She continues to have body aches and stiffness. She went to [facility] for a second opinion , she was seen by Dr. Cherlynn Kaiser on 03/30/2021 Anti-DFS 78 Ab   Polyarthralgia: seen by Dr. Kathi Ludwig back in 2019 and ANA was high and also inflammatory markers, we have been following levels, she had a second opinion at [place] 03/2021 saw Dr. Cherlynn Kaiser , infammatory markers back to normal   Paresthesia: she continues to have intermittent episodes of tingling and numbness on left arm, sometimes has it on her legs, no bowel or bladder incontinence, she feels weak but that has been stable, she states DR. Cherlynn Kaiser asked her about evaluation for MS, we did that back in 2012, patient is interested in having that repeated   Hypothyroidism: she has been on levothyroxine for years, TPO positive and she was seen by Dr. Gabriel Carina who is prescribing the levothyroxine , she is compliant with therapy. Last [facility] was done [facility] and normal. She states hair loss is still present but not severe No changein bowel movement    Pre-diabetes: last hgbA1C was 5.4% with change of life style, she lost 7 lbs since last visit, she is avoiding glutten.    Dyslipidemia; not on medication, on life style modification only. She has very high LDL at 170, she states not a high family history risk of heart attacks and strokes she has FMS and afraid of trying statin therapy Unchnaged    The 10-year ASCVD risk score (Arnett DK, et al., 2019) is: 2.9%   Values used to calculate the score:     Age: 55 years     Sex: Female     Is Non-Hispanic  African American: No     Diabetic: No     Tobacco smoker: No     Systolic Blood Pressure: 820 mmHg     Is BP treated: No     HDL Cholesterol: 49 mg/dL     Total Cholesterol: 256 mg/dL      Chronic fatigue: she is able to work a full time job, currently working from home and has helped her , she is going to the gym about 3 times a week.  She states she still goes to bed early - between 9-9:30 and wakes up between 5:30 -6 am She states Trazodone is helping with sleep, could not tolerate Provigil   Diet: she is on a gluten free diet, high protein and has lost weight  Exercise: walking 3 days a week for 30 minutes    [place] Visit from 08/30/2019 in [facility]  AUDIT-C Score 0      Depression: Phq 9 is  negative Depression screen [facility] 2/9 09/07/2021 03/02/2021 09/02/2020 02/27/2020 08/30/2019  Decreased Interest 0 0 0 0 0  Down, Depressed, Hopeless 0 0 0 0 0  PHQ - 2 Score 0 0 0 0 0  Altered sleeping - - - 0 2  Tired, decreased energy - - - 0 2  Change in appetite - - -  0 1  Feeling bad or failure about yourself  - - - 0 0  Trouble concentrating - - - 0 0  Moving slowly or fidgety/restless - - - 0 0  Suicidal thoughts - - - 0 0  PHQ-9 Score - - - 0 5  Difficult doing work/chores - - - - Not difficult at all   Hypertension: BP Readings from Last 3 Encounters:  09/07/21 128/80  03/02/21 112/74  09/02/20 100/72   Obesity: Wt Readings from Last 3 Encounters:  09/07/21 181 lb (82.1 kg)  03/02/21 188 lb (85.3 kg)  09/02/20 181 lb 14.4 oz (82.5 kg)   BMI Readings from Last 3 Encounters:  09/07/21 30.12 kg/m  03/02/21 31.28 kg/m  09/02/20 30.27 kg/m     Vaccines:   Shingrix: 56-64 yo and ask insurance if covered when patient above 10 yo Pneumonia: educated and discussed with patient. Flu: educated and discussed with patient.  Hep C Screening: 09/18/20 STD testing and prevention (HIV/chl/gon/syphilis): 03/27/17 Intimate partner violence:  negative Sexual History :no pain during intercourse  Menstrual History/LMP/Abnormal Bleeding: discussed post-menopausal bleeding  Incontinence Symptoms: no problems   Breast cancer:  - Last Mammogram: 07/07/21 - BRCA gene screening: N/A  Osteoporosis: Discussed high calcium and vitamin D supplementation, weight bearing exercises  Cervical cancer screening: N/A , sp hysterectomy   Skin cancer: Discussed monitoring for atypical lesions  Colorectal cancer: 11/22/16   Lung cancer: Low Dose CT Chest recommended if Age 56-80 years, 20 pack-year currently smoking OR have quit w/in 15years. Patient does not qualify.   ECG: 05/01/18  Advanced Care Planning: A voluntary discussion about advance care planning including the explanation and discussion of advance directives.  Discussed health care proxy and Living will, and the patient was able to identify a health care proxy as husband.    Lipids: Lab Results  Component Value Date   CHOL 256 (H) 09/18/2020   CHOL 226 (H) 11/08/2019   CHOL 238 (H) 04/25/2018   Lab Results  Component Value Date   HDL 49 09/18/2020   HDL 59 11/08/2019   HDL 62 04/25/2018   Lab Results  Component Value Date   LDLCALC 170 (H) 09/18/2020   LDLCALC 148 (H) 11/08/2019   LDLCALC 144 (H) 04/25/2018   Lab Results  Component Value Date   TRIG 198 (H) 09/18/2020   TRIG 109 11/08/2019   TRIG 160 (H) 04/25/2018   Lab Results  Component Value Date   CHOLHDL 5.2 (H) 09/18/2020   CHOLHDL 3.8 11/08/2019   CHOLHDL 3.8 04/25/2018   No results found for: LDLDIRECT  Glucose: Glucose  Date Value Ref Range Status  09/18/2020 98 65 - 99 mg/dL Final  11/08/2019 95 65 - 99 mg/dL Final  11/19/2018 79 65 - 99 mg/dL Final   Glucose, Bld  Date Value Ref Range Status  03/27/2017 114 (H) 65 - 99 mg/dL Final  03/26/2017 123 (H) 65 - 99 mg/dL Final    Patient Active Problem List   Diagnosis Date Noted   Varicose veins of both lower extremities 02/27/2019   Primary  osteoarthritis of both hands 06/15/2018   Primary osteoarthritis of both knees 06/15/2018   Primary osteoarthritis of both feet 06/15/2018   Positive ANA (antinuclear antibody) 06/15/2018   Fibromyalgia 06/15/2018   Encounter for screening colonoscopy 11/07/2016   Surgical menopause on hormone replacement therapy 09/01/2016   Vitamin D deficiency 04/20/2016   Low vitamin B12 level 04/20/2016   History of uterine leiomyoma 04/19/2016  Sciatica, right side 04/19/2016   Status post TAH-BSO 11/12/2015   History of ovarian cyst 11/12/2015   Dyslipidemia 07/04/2015   Adult hypothyroidism 07/04/2015   Excess weight 07/04/2015   Disturbance of skin sensation 07/04/2015   Lipoma of arm 12/03/2014   CFIDS (chronic fatigue and immune dysfunction syndrome) ([place]) 05/08/2007    Past Surgical History:  Procedure Laterality Date   ABDOMINAL HYSTERECTOMY N/A 10/05/2015   Procedure: HYSTERECTOMY ABDOMINAL;  Surgeon: [name], MD;  Location: [facility] ORS;  Service: Gynecology;  Laterality: N/A;   CHOLECYSTECTOMY N/A 03/27/2017   Procedure: LAPAROSCOPIC CHOLECYSTECTOMY;  Surgeon: Olean Ree, MD;  Location: [facility] ORS;  Service: General;  Laterality: N/A;   COLONOSCOPY WITH PROPOFOL N/A 11/22/2016   Procedure: COLONOSCOPY WITH PROPOFOL;  Surgeon: Robert Bellow, MD;  Location: [facility] ENDOSCOPY;  Service: Endoscopy;  Laterality: N/A;   FRACTURE SURGERY     LAPAROSCOPY N/A 08/24/2015   Procedure: LAPAROSCOPY DIAGNOSTIC;  Surgeon: [name], MD;  Location: [facility] ORS;  Service: Gynecology;  Laterality: N/A;   LEEP  [id number]   [facility]   MANDIBLE FRACTURE SURGERY  1996   SALPINGOOPHORECTOMY Bilateral 10/05/2015   Procedure: SALPINGO OOPHORECTOMY;  Surgeon: [name], MD;  Location: [facility] ORS;  Service: Gynecology;  Laterality: Bilateral;    Family History  Problem Relation Age of Onset   Lung cancer Mother    Cancer Mother    Lung cancer Father    Cancer Father    Kidney  Stones Daughter    Asthma Son    Breast cancer Paternal Aunt 28   Diabetes Paternal Grandmother    Thyroid disease Sister     Social History   Socioeconomic History   Marital status: Married    Spouse name: Doug   Number of children: 2   Years of education: Not on file   Highest education level: Associate degree: academic program  Occupational History   Occupation: Office manager: [employer]  Tobacco Use   Smoking status: Never   Smokeless tobacco: Never  Vaping Use   Vaping Use: Never used  Substance and Sexual Activity   Alcohol use: No    Alcohol/week: 0.0 standard drinks   Drug use: Never   Sexual activity: Yes    Partners: Male    Birth control/protection: None    Comment: Hysterectomy  Other Topics Concern   Not on file  Social History Narrative   Two grown children, married   Works at [employer] Strain: Low Risk    Difficulty of Paying Living Expenses: Not hard at all  Food Insecurity: No Food Insecurity   Worried About Charity fundraiser in the Last Year: Never true   Arboriculturist in the Last Year: Never true  Transportation Needs: No Transportation Needs   Lack of Transportation (Medical): No   Lack of Transportation (Non-Medical): No  Physical Activity: Insufficiently Active   Days of Exercise per Week: 3 days   Minutes of Exercise per Session: 30 min  Stress: No Stress Concern Present   Feeling of Stress : Only a little  Social Connections: Engineer, building services of Communication with Friends and Family: More than three times a week   Frequency of Social Gatherings with Friends and Family: Three times a week   Attends Religious Services: More than 4 times per year   Active Member of Clubs or Organizations: Yes  Attends Music therapist: More than 4 times per year   Marital Status: Married  Human resources officer Violence: Not At Risk   Fear of Current or Ex-Partner: No    Emotionally Abused: No   Physically Abused: No   Sexually Abused: No     Current Outpatient Medications:    Cyanocobalamin (B-12) 1000 MCG SUBL, Place 1 tablet under the tongue 3 (three) times a week., Disp: 90 tablet, Rfl: 1   DULoxetine (CYMBALTA) 60 MG capsule, Take 1 capsule (60 mg total) by mouth daily., Disp: 90 capsule, Rfl: 1   estradiol (ESTRACE) 0.5 MG tablet, Take 1 tablet (0.5 mg total) by mouth daily., Disp: 90 tablet, Rfl: 1   levothyroxine (SYNTHROID) 125 MCG tablet, Take 125 mcg by mouth daily., Disp: , Rfl:    modafinil (PROVIGIL) 100 MG tablet, Take 1 tablet (100 mg total) by mouth daily., Disp: 90 tablet, Rfl: 0   pregabalin (LYRICA) 75 MG capsule, Take 1 capsule (75 mg total) by mouth 3 (three) times daily., Disp: 270 capsule, Rfl: 1   traZODone (DESYREL) 50 MG tablet, Take 1 tablet (50 mg total) by mouth at bedtime., Disp: 90 tablet, Rfl: 1   Vitamin D, Ergocalciferol, (DRISDOL) 1.25 MG (50000 UNIT) CAPS capsule, Take 1 capsule (50,000 Units total) by mouth once a week., Disp: 12 capsule, Rfl: 1  No Known Allergies   ROS  Constitutional: Negative for fever or weight change.  Respiratory: Negative for cough and shortness of breath.   Cardiovascular: Negative for chest pain or palpitations.  Gastrointestinal: Negative for abdominal pain, no bowel changes.  Musculoskeletal: Negative for gait problem or joint swelling.  Skin: Negative for rash.  Neurological: Negative for dizziness or headache.  No other specific complaints in a complete review of systems (except as listed in HPI above).   Objective  Vitals:   09/07/21 0801  BP: 128/80  Pulse: 86  Resp: 16  Temp: 98 F (36.7 C)  SpO2: 98%  Weight: 181 lb (82.1 kg)  Height: '5\' 5"'  (1.651 m)    Body mass index is 30.12 kg/m.  Physical Exam  Constitutional: Patient appears well-developed and well-nourished. No distress.  HENT: Head: Normocephalic and atraumatic. Ears: B TMs ok, no erythema or effusion;  Nose: Nose normal. Mouth/Throat: not done  Eyes: Conjunctivae and EOM are normal. Pupils are equal, round, and reactive to light. No scleral icterus.  Neck: Normal range of motion. Neck supple. No JVD present. No thyromegaly present.  Cardiovascular: Normal rate, regular rhythm and normal heart sounds.  No murmur heard. No BLE edema. Pulmonary/Chest: Effort normal and breath sounds normal. No respiratory distress. Abdominal: Soft. Bowel sounds are normal, no distension. There is no tenderness. no masses Breast: no lumps or masses, no nipple discharge or rashes FEMALE GENITALIA:  Not done  RECTAL: not done  Musculoskeletal: Normal range of motion, no joint effusions. No gross deformities Neurological: he is alert and oriented to person, place, and time. No cranial nerve deficit. Coordination, balance, strength, speech and gait are normal.  Skin: Skin is warm and dry. No rash noted. No erythema. Some ecchymosis arms  Psychiatric: Patient has a normal mood and affect. behavior is normal. Judgment and thought content normal.   Fall Risk: Fall Risk  09/07/2021 03/02/2021 09/02/2020 08/30/2019 01/23/2019  Falls in the past year? 0 0 0 0 1  Number falls in past yr: 0 0 0 0 1  Injury with Fall? 0 0 0 0 0  Risk for fall due  to : No Fall Risks - - - Impaired balance/gait  Follow up Falls prevention discussed - - - -     Functional Status Survey: Is the patient deaf or have difficulty hearing?: No Does the patient have difficulty seeing, even when wearing glasses/contacts?: No Does the patient have difficulty concentrating, remembering, or making decisions?: Yes Does the patient have difficulty walking or climbing stairs?: No Does the patient have difficulty dressing or bathing?: No Does the patient have difficulty doing errands alone such as visiting a doctor's office or shopping?: No   Assessment & Plan  1. Well adult exam  - Comprehensive metabolic panel  2. Need for immunization against  influenza  - Flu Vaccine QUAD 33moIM (Fluarix, Fluzone & Alfiuria Quad PF)  3. Need for shingles vaccine  - Varicella-zoster vaccine IM  4. Prediabetes  - Hemoglobin A1c  5. CFIDS (chronic fatigue and immune dysfunction syndrome) ([facility])   6. Fibromyalgia  - DULoxetine (CYMBALTA) 60 MG capsule; Take 1 capsule (60 mg total) by mouth daily.  Dispense: 90 capsule; Refill: 1 - pregabalin (LYRICA) 75 MG capsule; Take 1 capsule (75 mg total) by mouth 3 (three) times daily.  Dispense: 270 capsule; Refill: 1  7. Hypothyroidism due to Hashimoto's thyroiditis  - TSH  8. Dyslipidemia  - Lipid panel  9. Vitamin D deficiency  - VITAMIN D 25 Hydroxy (Vit-D Deficiency, Fractures) - Vitamin D, Ergocalciferol, (DRISDOL) 1.25 MG (50000 UNIT) CAPS capsule; Take 1 capsule (50,000 Units total) by mouth once a week.  Dispense: 12 capsule; Refill: 1  10. Low vitamin B12 level  - CBC with Differential/Platelet - Vitamin B12  11. Paresthesia  - MR Brain W Wo Contrast; Future - MR CERVICAL SPINE W CONTRAST; Future  12. Elevated antinuclear antibody (ANA) level   13. Arthralgia, unspecified joint   14. Elevated C-reactive protein  - Sedimentation rate - C-reactive protein  15. Insomnia, unspecified type  - traZODone (DESYREL) 50 MG tablet; Take 1 tablet (50 mg total) by mouth at bedtime.  Dispense: 90 tablet; Refill: 1  16. Long-term use of high-risk medication  - Comprehensive metabolic panel  17. Hot flashes  - estradiol (ESTRACE) 0.5 MG tablet; Take 0.5-1 tablets (0.25-0.5 mg total) by mouth daily.  Dispense: 90 tablet; Refill: 1    -USPSTF grade A and B recommendations reviewed with patient; age-appropriate recommendations, preventive care, screening tests, etc discussed and encouraged; healthy living encouraged; see AVS for patient education given to patient -Discussed importance of 150 minutes of physical activity weekly, eat two servings of fish weekly, eat one serving  of tree nuts ( cashews, pistachios, pecans, almonds..Marland Kitchen every other day, eat 6 servings of fruit/vegetables daily and drink plenty of water and avoid sweet beverages.

## 2021-09-06 NOTE — Patient Instructions (Signed)
Preventive Care 40-56 Years Old, Female Preventive care refers to lifestyle choices and visits with your health care provider that can promote health and wellness. This includes: A yearly physical exam. This is also called an annual wellness visit. Regular dental and eye exams. Immunizations. Screening for certain conditions. Healthy lifestyle choices, such as: Eating a healthy diet. Getting regular exercise. Not using drugs or products that contain nicotine and tobacco. Limiting alcohol use. What can I expect for my preventive care visit? Physical exam Your health care provider will check your: Height and weight. These may be used to calculate your BMI (body mass index). BMI is a measurement that tells if you are at a healthy weight. Heart rate and blood pressure. Body temperature. Skin for abnormal spots. Counseling Your health care provider may ask you questions about your: Past medical problems. Family's medical history. Alcohol, tobacco, and drug use. Emotional well-being. Home life and relationship well-being. Sexual activity. Diet, exercise, and sleep habits. Work and work environment. Access to firearms. Method of birth control. Menstrual cycle. Pregnancy history. What immunizations do I need? Vaccines are usually given at various ages, according to a schedule. Your health care provider will recommend vaccines for you based on your age, medical history, and lifestyle or other factors, such as travel or where you work. What tests do I need? Blood tests Lipid and cholesterol levels. These may be checked every 5 years, or more often if you are over 50 years old. Hepatitis C test. Hepatitis B test. Screening Lung cancer screening. You may have this screening every year starting at age 55 if you have a 30-pack-year history of smoking and currently smoke or have quit within the past 15 years. Colorectal cancer screening. All adults should have this screening starting at  age 50 and continuing until age 75. Your health care provider may recommend screening at age 45 if you are at increased risk. You will have tests every 1-10 years, depending on your results and the type of screening test. Diabetes screening. This is done by checking your blood sugar (glucose) after you have not eaten for a while (fasting). You may have this done every 1-3 years. Mammogram. This may be done every 1-2 years. Talk with your health care provider about when you should start having regular mammograms. This may depend on whether you have a family history of breast cancer. BRCA-related cancer screening. This may be done if you have a family history of breast, ovarian, tubal, or peritoneal cancers. Pelvic exam and Pap test. This may be done every 3 years starting at age 21. Starting at age 30, this may be done every 5 years if you have a Pap test in combination with an HPV test. Other tests STD (sexually transmitted disease) testing, if you are at risk. Bone density scan. This is done to screen for osteoporosis. You may have this scan if you are at high risk for osteoporosis. Talk with your health care provider about your test results, treatment options, and if necessary, the need for more tests. Follow these instructions at home: Eating and drinking  Eat a diet that includes fresh fruits and vegetables, whole grains, lean protein, and low-fat dairy products. Take vitamin and mineral supplements as recommended by your health care provider. Do not drink alcohol if: Your health care provider tells you not to drink. You are pregnant, may be pregnant, or are planning to become pregnant. If you drink alcohol: Limit how much you have to 0-1 drink a day. Be   aware of how much alcohol is in your drink. In the [place] one drink equals one 12 oz bottle of beer (355 mL), one 5 oz glass of wine (148 mL), or one 1 oz glass of hard liquor (44 mL). Lifestyle Take daily care of your teeth and  gums. Brush your teeth every morning and night with fluoride toothpaste. Floss one time each day. Stay active. Exercise for at least 30 minutes 5 or more days each week. Do not use any products that contain nicotine or tobacco, such as cigarettes, e-cigarettes, and chewing tobacco. If you need help quitting, ask your health care provider. Do not use drugs. If you are sexually active, practice safe sex. Use a condom or other form of protection to prevent STIs (sexually transmitted infections). If you do not wish to become pregnant, use a form of birth control. If you plan to become pregnant, see your health care provider for a prepregnancy visit. If told by your health care provider, take low-dose aspirin daily starting at age 63. Find healthy ways to cope with stress, such as: Meditation, yoga, or listening to music. Journaling. Talking to a trusted person. Spending time with friends and family. Safety Always wear your seat belt while driving or riding in a vehicle. Do not drive: If you have been drinking alcohol. Do not ride with someone who has been drinking. When you are tired or distracted. While texting. Wear a helmet and other protective equipment during sports activities. If you have firearms in your house, make sure you follow all gun safety procedures. What's next? Visit your health care provider once a year for an annual wellness visit. Ask your health care provider how often you should have your eyes and teeth checked. Stay up to date on all vaccines. This information is not intended to replace advice given to you by your health care provider. Make sure you discuss any questions you have with your health care provider. Document Revised: 02/19/2021 Document Reviewed: 08/23/2018 Elsevier Patient Education  2022 [employer].

## 2021-09-07 ENCOUNTER — Ambulatory Visit (INDEPENDENT_AMBULATORY_CARE_PROVIDER_SITE_OTHER): Payer: Managed Care, Other (non HMO) | Admitting: Family Medicine

## 2021-09-07 ENCOUNTER — Encounter: Payer: Self-pay | Admitting: Family Medicine

## 2021-09-07 ENCOUNTER — Other Ambulatory Visit: Payer: Self-pay

## 2021-09-07 VITALS — BP 128/80 | HR 86 | Temp 98.0°F | Resp 16 | Ht 65.0 in | Wt 181.0 lb

## 2021-09-07 DIAGNOSIS — R7303 Prediabetes: Secondary | ICD-10-CM | POA: Diagnosis not present

## 2021-09-07 DIAGNOSIS — E559 Vitamin D deficiency, unspecified: Secondary | ICD-10-CM

## 2021-09-07 DIAGNOSIS — Z0000 Encounter for general adult medical examination without abnormal findings: Secondary | ICD-10-CM

## 2021-09-07 DIAGNOSIS — R5382 Chronic fatigue, unspecified: Secondary | ICD-10-CM | POA: Diagnosis not present

## 2021-09-07 DIAGNOSIS — E038 Other specified hypothyroidism: Secondary | ICD-10-CM

## 2021-09-07 DIAGNOSIS — Z23 Encounter for immunization: Secondary | ICD-10-CM | POA: Diagnosis not present

## 2021-09-07 DIAGNOSIS — M797 Fibromyalgia: Secondary | ICD-10-CM

## 2021-09-07 DIAGNOSIS — E063 Autoimmune thyroiditis: Secondary | ICD-10-CM

## 2021-09-07 DIAGNOSIS — E538 Deficiency of other specified B group vitamins: Secondary | ICD-10-CM

## 2021-09-07 DIAGNOSIS — R202 Paresthesia of skin: Secondary | ICD-10-CM

## 2021-09-07 DIAGNOSIS — M2550 Pain in unspecified joint: Secondary | ICD-10-CM

## 2021-09-07 DIAGNOSIS — R768 Other specified abnormal immunological findings in serum: Secondary | ICD-10-CM

## 2021-09-07 DIAGNOSIS — E785 Hyperlipidemia, unspecified: Secondary | ICD-10-CM

## 2021-09-07 DIAGNOSIS — G4700 Insomnia, unspecified: Secondary | ICD-10-CM

## 2021-09-07 DIAGNOSIS — R232 Flushing: Secondary | ICD-10-CM

## 2021-09-07 DIAGNOSIS — Z79899 Other long term (current) drug therapy: Secondary | ICD-10-CM

## 2021-09-07 DIAGNOSIS — R7982 Elevated C-reactive protein (CRP): Secondary | ICD-10-CM

## 2021-09-07 DIAGNOSIS — D8989 Other specified disorders involving the immune mechanism, not elsewhere classified: Secondary | ICD-10-CM

## 2021-09-07 MED ORDER — PREGABALIN 75 MG PO CAPS: 75.0000 mg | ORAL_CAPSULE | Freq: Three times a day (TID) | ORAL | 1 refills | Status: DC

## 2021-09-07 MED ORDER — ESTRADIOL 0.5 MG PO TABS: 0.2500 mg | ORAL_TABLET | Freq: Every day | ORAL | 1 refills | Status: DC

## 2021-09-07 MED ORDER — DULOXETINE HCL 60 MG PO CPEP: 60.0000 mg | ORAL_CAPSULE | Freq: Every day | ORAL | 1 refills | Status: DC

## 2021-09-07 MED ORDER — TRAZODONE HCL 50 MG PO TABS: 50.0000 mg | ORAL_TABLET | Freq: Every day | ORAL | 1 refills | Status: DC

## 2021-09-07 MED ORDER — VITAMIN D (ERGOCALCIFEROL) 1.25 MG (50000 UNIT) PO CAPS: 50000.0000 [IU] | ORAL_CAPSULE | ORAL | 1 refills | Status: DC

## 2021-09-08 LAB — COMPREHENSIVE METABOLIC PANEL
ALT: 18 IU/L (ref 0–32)
AST: 20 IU/L (ref 0–40)
Albumin/Globulin Ratio: 2 (ref 1.2–2.2)
Albumin: 4.7 g/dL (ref 3.8–4.9)
Alkaline Phosphatase: 77 IU/L (ref 44–121)
BUN/Creatinine Ratio: 22 (ref 9–23)
BUN: 20 mg/dL (ref 6–24)
Bilirubin Total: 0.3 mg/dL (ref 0.0–1.2)
CO2: 21 mmol/L (ref 20–29)
Calcium: 9.3 mg/dL (ref 8.7–10.2)
Chloride: 103 mmol/L (ref 96–106)
Creatinine, Ser: 0.89 mg/dL (ref 0.57–1.00)
Globulin, Total: 2.3 g/dL (ref 1.5–4.5)
Glucose: 86 mg/dL (ref 65–99)
Potassium: 4.8 mmol/L (ref 3.5–5.2)
Sodium: 141 mmol/L (ref 134–144)
Total Protein: 7 g/dL (ref 6.0–8.5)
eGFR: 77 mL/min/{1.73_m2} (ref >59)

## 2021-09-08 LAB — VITAMIN D 25 HYDROXY (VIT D DEFICIENCY, FRACTURES): Vit D, 25-Hydroxy: 59.1 ng/mL (ref 30.0–100.0)

## 2021-09-08 LAB — CBC WITH DIFFERENTIAL/PLATELET
Basophils Absolute: 0 10*3/uL (ref 0.0–0.2)
Basos: 0 %
EOS (ABSOLUTE): 0.1 10*3/uL (ref 0.0–0.4)
Eos: 3 %
Hematocrit: 41.2 % (ref 34.0–46.6)
Hemoglobin: 13.6 g/dL (ref 11.1–15.9)
Immature Grans (Abs): 0 10*3/uL (ref 0.0–0.1)
Immature Granulocytes: 0 %
Lymphocytes Absolute: 2.2 10*3/uL (ref 0.7–3.1)
Lymphs: 38 %
MCH: 30.8 pg (ref 26.6–33.0)
MCHC: 33 g/dL (ref 31.5–35.7)
MCV: 93 fL (ref 79–97)
Monocytes Absolute: 0.3 10*3/uL (ref 0.1–0.9)
Monocytes: 5 %
Neutrophils Absolute: 3.1 10*3/uL (ref 1.4–7.0)
Neutrophils: 54 %
Platelets: 300 10*3/uL (ref 150–450)
RBC: 4.41 x10E6/uL (ref 3.77–5.28)
RDW: 12.4 % (ref 11.7–15.4)
WBC: 5.7 10*3/uL (ref 3.4–10.8)

## 2021-09-08 LAB — LIPID PANEL
Chol/HDL Ratio: 4.4 ratio (ref 0.0–4.4)
Cholesterol, Total: 247 mg/dL — ABNORMAL HIGH (ref 100–199)
HDL: 56 mg/dL (ref >39)
LDL Chol Calc (NIH): 170 mg/dL — ABNORMAL HIGH (ref 0–99)
Triglycerides: 119 mg/dL (ref 0–149)
VLDL Cholesterol Cal: 21 mg/dL (ref 5–40)

## 2021-09-08 LAB — VITAMIN B12: Vitamin B-12: 374 pg/mL (ref 232–1245)

## 2021-09-08 LAB — HEMOGLOBIN A1C
Est. average glucose Bld gHb Est-mCnc: 111 mg/dL
Hgb A1c MFr Bld: 5.5 % (ref 4.8–5.6)

## 2021-09-08 LAB — TSH: TSH: 0.551 u[IU]/mL (ref 0.450–4.500)

## 2021-09-08 LAB — SEDIMENTATION RATE: Sed Rate: 19 mm/hr (ref 0–40)

## 2021-09-08 LAB — C-REACTIVE PROTEIN: CRP: 7 mg/L (ref 0–10)

## 2021-09-22 ENCOUNTER — Other Ambulatory Visit: Payer: Self-pay | Admitting: Family Medicine

## 2021-09-22 DIAGNOSIS — R202 Paresthesia of skin: Secondary | ICD-10-CM

## 2021-09-27 ENCOUNTER — Other Ambulatory Visit: Payer: Self-pay

## 2021-09-27 ENCOUNTER — Ambulatory Visit: Admission: RE | Admit: 2021-09-27 | Payer: Managed Care, Other (non HMO) | Source: Ambulatory Visit

## 2021-09-27 ENCOUNTER — Ambulatory Visit
Admission: RE | Admit: 2021-09-27 | Discharge: 2021-09-27 | Disposition: A | Discharge status: Home / Self Care | Payer: Managed Care, Other (non HMO) | Source: Ambulatory Visit | Attending: Family Medicine | Admitting: Family Medicine

## 2021-09-27 DIAGNOSIS — R202 Paresthesia of skin: Secondary | ICD-10-CM | POA: Diagnosis not present

## 2021-09-27 MED ORDER — GADOBUTROL 1 MMOL/ML IV SOLN
7.5000 mL | Freq: Once | INTRAVENOUS | Status: AC | PRN
  Administered 2021-09-27: 7.5 mL via INTRAVENOUS

## 2021-10-02 ENCOUNTER — Other Ambulatory Visit: Payer: Self-pay

## 2021-10-02 ENCOUNTER — Ambulatory Visit
Admission: RE | Admit: 2021-10-02 | Discharge: 2021-10-02 | Disposition: A | Discharge status: Home / Self Care | Payer: Managed Care, Other (non HMO) | Source: Ambulatory Visit | Attending: Family Medicine | Admitting: Family Medicine

## 2021-10-02 DIAGNOSIS — R202 Paresthesia of skin: Secondary | ICD-10-CM | POA: Insufficient documentation

## 2021-10-02 MED ORDER — GADOBUTROL 1 MMOL/ML IV SOLN
7.5000 mL | Freq: Once | INTRAVENOUS | Status: AC | PRN
  Administered 2021-10-02: 7.5 mL via INTRAVENOUS

## 2021-10-04 ENCOUNTER — Encounter: Payer: Self-pay | Admitting: Family Medicine

## 2021-10-06 ENCOUNTER — Other Ambulatory Visit: Payer: Self-pay | Admitting: Family Medicine

## 2021-10-06 DIAGNOSIS — M5030 Other cervical disc degeneration, unspecified cervical region: Secondary | ICD-10-CM

## 2022-03-03 NOTE — Progress Notes (Signed)
Name: Monica Hardin   MRN: [id number]    DOB: [birth year]   Date:03/07/2022       Progress Note  Subjective  Chief Complaint  Follow Up  HPI  FMS: she saw Dr. Jefferson Fuel back in 05/2018 and was given formal diagnosis of FMS. She started on Duloxetine years ago we added Lyrica in January  2020 and she states the combination has been helping with symptoms. . She continues to have body aches intermittent but morning  stiffness is still daily but stable. Marland Kitchen She went to [facility] for a second opinion , she was seen by Dr. Cherlynn Kaiser on 03/30/2021 Anti-DFS 52 Ab was positive. Last CRP and sed rate negative done 09/22    Polyarthralgia: seen by Dr. Kathi Ludwig back in 2019 and ANA was high and also inflammatory markers, we have been following levels, she had a second opinion at [facility] 03/2021 saw Dr. Cherlynn Kaiser , infammatory markers back to normal . Unchanged   Paresthesia: she continues to have intermittent episodes of tingling and numbness on left arm, sometimes has it on her legs, no bowel or bladder incontinence, she feels weak but that has been stable, she states Dr. Cherlynn Kaiser asked her about evaluation for MS, we did that back in 2012 and we repeated MRI c-spine and MRI and negative for MS, she had stenosis C4-C5   Hypothyroidism: she has been on levothyroxine for years, TPO positive and she was seen by Dr. Gabriel Carina who is prescribing the levothyroxine , she is compliant with therapy. Last TSH was done in September and normal  She states hair loss is still present but not severe No changein bowel movement . Denies dypshagia    Pre-diabetes: last hgbA1C was 5.4% with change of life style, her weight has been stable. She eats a low sugar diet   Dyslipidemia; not on medication, on life style modification only. She has very high LDL at 170, she states not a high family history risk of heart attacks and strokes she has FMS but willing to try medication today    The 10-year ASCVD risk score (Arnett DK, et al., 2019)  is: 2.7%   Values used to calculate the score:     Age: 56 years     Sex: Female     Is Non-Hispanic African American: No     Diabetic: No     Tobacco smoker: No     Systolic Blood Pressure: 967 mmHg     Is BP treated: No     HDL Cholesterol: 56 mg/dL     Total Cholesterol: 247 mg/dL      Chronic fatigue: she is able to work a full time job, currently working from home and has helped her , she is going to the gym about 3 times a week.  She states she still goes to bed early - between 9-9:30 and wakes up between 5:30 -6 am She states Trazodone is helping with sleep, could not tolerate Provigil it caused her to feel jittery    Patient Active Problem List   Diagnosis Date Noted   Varicose veins of both lower extremities 02/27/2019   Primary osteoarthritis of both hands 06/15/2018   Primary osteoarthritis of both knees 06/15/2018   Primary osteoarthritis of both feet 06/15/2018   Positive ANA (antinuclear antibody) 06/15/2018   Fibromyalgia 06/15/2018   Encounter for screening colonoscopy 11/07/2016   Surgical menopause on hormone replacement therapy 09/01/2016   Vitamin D deficiency 04/20/2016   Low vitamin B12 level 04/20/2016  History of uterine leiomyoma 04/19/2016   Sciatica, right side 04/19/2016   Status post TAH-BSO 11/12/2015   History of ovarian cyst 11/12/2015   Dyslipidemia 07/04/2015   Adult hypothyroidism 07/04/2015   Excess weight 07/04/2015   Disturbance of skin sensation 07/04/2015   Lipoma of arm 12/03/2014   CFIDS (chronic fatigue and immune dysfunction syndrome) ([place]) 05/08/2007    Past Surgical History:  Procedure Laterality Date   ABDOMINAL HYSTERECTOMY N/A 10/05/2015   Procedure: HYSTERECTOMY ABDOMINAL;  Surgeon: [name], MD;  Location: [facility] ORS;  Service: Gynecology;  Laterality: N/A;   CHOLECYSTECTOMY N/A 03/27/2017   Procedure: LAPAROSCOPIC CHOLECYSTECTOMY;  Surgeon: Olean Ree, MD;  Location: [facility];  Service: General;  Laterality:  N/A;   COLONOSCOPY WITH PROPOFOL N/A 11/22/2016   Procedure: COLONOSCOPY WITH PROPOFOL;  Surgeon: Robert Bellow, MD;  Location: [facility];  Service: Endoscopy;  Laterality: N/A;   FRACTURE SURGERY     LAPAROSCOPY N/A 08/24/2015   Procedure: LAPAROSCOPY DIAGNOSTIC;  Surgeon: [name], MD;  Location: [facility];  Service: Gynecology;  Laterality: N/A;   LEEP  [id number]   [facility]   MANDIBLE FRACTURE SURGERY  1996   SALPINGOOPHORECTOMY Bilateral 10/05/2015   Procedure: SALPINGO OOPHORECTOMY;  Surgeon: [name], MD;  Location: [facility] ORS;  Service: Gynecology;  Laterality: Bilateral;    Family History  Problem Relation Age of Onset   Lung cancer Mother    Cancer Mother    Lung cancer Father    Cancer Father    Kidney Stones Daughter    Asthma Son    Breast cancer Paternal Aunt 12   Diabetes Paternal Grandmother    Thyroid disease Sister     Social History   Tobacco Use   Smoking status: Never   Smokeless tobacco: Never  Substance Use Topics   Alcohol use: No    Alcohol/week: 0.0 standard drinks     Current Outpatient Medications:    Cyanocobalamin (B-12) 1000 MCG SUBL, Place 1 tablet under the tongue 3 (three) times a week., Disp: 90 tablet, Rfl: 1   levothyroxine (SYNTHROID) 125 MCG tablet, Take 125 mcg by mouth daily., Disp: , Rfl:    Vitamin D, Ergocalciferol, (DRISDOL) 1.25 MG (50000 UNIT) CAPS capsule, Take 1 capsule (50,000 Units total) by mouth once a week., Disp: 12 capsule, Rfl: 1   DULoxetine (CYMBALTA) 60 MG capsule, Take 1 capsule (60 mg total) by mouth daily., Disp: 90 capsule, Rfl: 1   estradiol (ESTRACE) 0.5 MG tablet, Take 0.5-1 tablets (0.25-0.5 mg total) by mouth daily., Disp: 90 tablet, Rfl: 1   pregabalin (LYRICA) 75 MG capsule, Take 1 capsule (75 mg total) by mouth 3 (three) times daily., Disp: 270 capsule, Rfl: 1   traZODone (DESYREL) 50 MG tablet, Take 1 tablet (50 mg total) by mouth at bedtime., Disp: 90 tablet, Rfl: 1  No Known  Allergies  I personally reviewed active problem list, medication list, allergies, family history, social history, health maintenance with the patient/caregiver today.   ROS  Constitutional: Negative for fever or weight change.  Respiratory: Negative for cough and shortness of breath.   Cardiovascular: Negative for chest pain or palpitations.  Gastrointestinal: Negative for abdominal pain, no bowel changes.  Musculoskeletal: Negative for gait problem or joint swelling.  Skin: Negative for rash.  Neurological: Negative for dizziness or headache.  No other specific complaints in a complete review of systems (except as listed in HPI above).   Objective  Vitals:   03/07/22 0755  BP: 128/70  Pulse: 88  Resp: 16  SpO2: 98%  Weight: 186 lb (84.4 kg)  Height: '5\' 6"'$  (1.676 m)    Body mass index is 30.02 kg/m.  Physical Exam  Constitutional: Patient appears well-developed and well-nourished. Obese  No distress.  HEENT: head atraumatic, normocephalic, pupils equal and reactive to light, neck supple Cardiovascular: Normal rate, regular rhythm and normal heart sounds.  No murmur heard. No BLE edema. Pulmonary/Chest: Effort normal and breath sounds normal. No respiratory distress. Abdominal: Soft.  There is no tenderness. Muscular Skeletal: trigger point positive  Psychiatric: Patient has a normal mood and affect. behavior is normal. Judgment and thought content normal.   PHQ2/9: Depression screen [facility] 2/9 03/07/2022 09/07/2021 03/02/2021 09/02/2020 02/27/2020  Decreased Interest 0 0 0 0 0  Down, Depressed, Hopeless 0 0 0 0 0  PHQ - 2 Score 0 0 0 0 0  Altered sleeping 0 - - - 0  Tired, decreased energy 0 - - - 0  Change in appetite 0 - - - 0  Feeling bad or failure about yourself  0 - - - 0  Trouble concentrating 0 - - - 0  Moving slowly or fidgety/restless 0 - - - 0  Suicidal thoughts 0 - - - 0  PHQ-9 Score 0 - - - 0  Difficult doing work/chores - - - - -    phq 9 is  negative   Fall Risk: Fall Risk  03/07/2022 09/07/2021 03/02/2021 09/02/2020 08/30/2019  Falls in the past year? 0 0 0 0 0  Number falls in past yr: 0 0 0 0 0  Injury with Fall? 0 0 0 0 0  Risk for fall due to : No Fall Risks No Fall Risks - - -  Follow up Falls prevention discussed Falls prevention discussed - - -      Functional Status Survey: Is the patient deaf or have difficulty hearing?: No Does the patient have difficulty seeing, even when wearing glasses/contacts?: No Does the patient have difficulty concentrating, remembering, or making decisions?: No Does the patient have difficulty walking or climbing stairs?: No Does the patient have difficulty dressing or bathing?: No Does the patient have difficulty doing errands alone such as visiting a doctor's office or shopping?: No    Assessment & Plan  1. Fibromyalgia  - DULoxetine (CYMBALTA) 60 MG capsule; Take 1 capsule (60 mg total) by mouth daily.  Dispense: 90 capsule; Refill: 1 - pregabalin (LYRICA) 75 MG capsule; Take 1 capsule (75 mg total) by mouth 3 (three) times daily.  Dispense: 270 capsule; Refill: 1  2. Hot flashes  - estradiol (ESTRACE) 0.5 MG tablet; Take 0.5-1 tablets (0.25-0.5 mg total) by mouth daily.  Dispense: 90 tablet; Refill: 1  3. Insomnia, unspecified type  - traZODone (DESYREL) 50 MG tablet; Take 1 tablet (50 mg total) by mouth at bedtime.  Dispense: 90 tablet; Refill: 1  4. Prediabetes   5. CFIDS (chronic fatigue and immune dysfunction syndrome) ([facility])   6. Dyslipidemia  She is willing to try Crestor , we will start at 5 mg dose   7. Hypothyroidism due to Hashimoto's thyroiditis  Keep follow up with Dr. Gabriel Carina   8. Vitamin D deficiency   9. Low vitamin B12 level  Resume B12  10. DDD (degenerative disc disease), cervical   11. Need for shingles vaccine  - Varicella-zoster vaccine IM

## 2022-03-07 ENCOUNTER — Other Ambulatory Visit: Payer: Self-pay

## 2022-03-07 ENCOUNTER — Encounter: Payer: Self-pay | Admitting: Family Medicine

## 2022-03-07 ENCOUNTER — Ambulatory Visit (INDEPENDENT_AMBULATORY_CARE_PROVIDER_SITE_OTHER): Payer: Managed Care, Other (non HMO) | Admitting: Family Medicine

## 2022-03-07 VITALS — BP 128/70 | HR 88 | Resp 16 | Ht 66.0 in | Wt 186.0 lb

## 2022-03-07 DIAGNOSIS — G9332 Myalgic encephalomyelitis/chronic fatigue syndrome: Secondary | ICD-10-CM

## 2022-03-07 DIAGNOSIS — M797 Fibromyalgia: Secondary | ICD-10-CM

## 2022-03-07 DIAGNOSIS — E538 Deficiency of other specified B group vitamins: Secondary | ICD-10-CM

## 2022-03-07 DIAGNOSIS — E063 Autoimmune thyroiditis: Secondary | ICD-10-CM

## 2022-03-07 DIAGNOSIS — E038 Other specified hypothyroidism: Secondary | ICD-10-CM

## 2022-03-07 DIAGNOSIS — R7303 Prediabetes: Secondary | ICD-10-CM

## 2022-03-07 DIAGNOSIS — E559 Vitamin D deficiency, unspecified: Secondary | ICD-10-CM

## 2022-03-07 DIAGNOSIS — D8989 Other specified disorders involving the immune mechanism, not elsewhere classified: Secondary | ICD-10-CM

## 2022-03-07 DIAGNOSIS — R7989 Other specified abnormal findings of blood chemistry: Secondary | ICD-10-CM

## 2022-03-07 DIAGNOSIS — Z23 Encounter for immunization: Secondary | ICD-10-CM

## 2022-03-07 DIAGNOSIS — M5030 Other cervical disc degeneration, unspecified cervical region: Secondary | ICD-10-CM

## 2022-03-07 DIAGNOSIS — R232 Flushing: Secondary | ICD-10-CM

## 2022-03-07 DIAGNOSIS — G4700 Insomnia, unspecified: Secondary | ICD-10-CM

## 2022-03-07 DIAGNOSIS — E785 Hyperlipidemia, unspecified: Secondary | ICD-10-CM

## 2022-03-07 MED ORDER — ESTRADIOL 0.5 MG PO TABS: 0.2500 mg | ORAL_TABLET | Freq: Every day | ORAL | 1 refills | Status: DC

## 2022-03-07 MED ORDER — PREGABALIN 75 MG PO CAPS: 75.0000 mg | ORAL_CAPSULE | Freq: Three times a day (TID) | ORAL | 1 refills | Status: DC

## 2022-03-07 MED ORDER — TRAZODONE HCL 50 MG PO TABS: 50.0000 mg | ORAL_TABLET | Freq: Every day | ORAL | 1 refills | Status: DC

## 2022-03-07 MED ORDER — ROSUVASTATIN CALCIUM 5 MG PO TABS: 5.0000 mg | ORAL_TABLET | Freq: Every day | ORAL | 1 refills | Status: DC

## 2022-03-07 MED ORDER — DULOXETINE HCL 60 MG PO CPEP: 60.0000 mg | ORAL_CAPSULE | Freq: Every day | ORAL | 1 refills | Status: DC

## 2022-08-05 ENCOUNTER — Other Ambulatory Visit: Payer: Self-pay | Admitting: Family Medicine

## 2022-08-05 DIAGNOSIS — Z1231 Encounter for screening mammogram for malignant neoplasm of breast: Secondary | ICD-10-CM

## 2022-08-30 ENCOUNTER — Ambulatory Visit
Admission: RE | Admit: 2022-08-30 | Discharge: 2022-08-30 | Disposition: A | Discharge status: Home / Self Care | Payer: Managed Care, Other (non HMO) | Source: Ambulatory Visit | Attending: Family Medicine | Admitting: Family Medicine

## 2022-08-30 DIAGNOSIS — Z1231 Encounter for screening mammogram for malignant neoplasm of breast: Secondary | ICD-10-CM | POA: Diagnosis not present

## 2022-09-09 ENCOUNTER — Encounter: Payer: Managed Care, Other (non HMO) | Admitting: Family Medicine

## 2022-09-18 ENCOUNTER — Encounter: Payer: Self-pay | Admitting: Family Medicine

## 2022-09-19 ENCOUNTER — Ambulatory Visit
Admission: RE | Admit: 2022-09-19 | Discharge: 2022-09-19 | Disposition: A | Discharge status: Home / Self Care | Payer: Managed Care, Other (non HMO) | Attending: Family Medicine | Admitting: Family Medicine

## 2022-09-19 ENCOUNTER — Ambulatory Visit
Admission: RE | Admit: 2022-09-19 | Discharge: 2022-09-19 | Disposition: A | Discharge status: Home / Self Care | Payer: Managed Care, Other (non HMO) | Source: Ambulatory Visit | Attending: Family Medicine | Admitting: *Deleted

## 2022-09-19 ENCOUNTER — Ambulatory Visit: Payer: Managed Care, Other (non HMO) | Admitting: Family Medicine

## 2022-09-19 ENCOUNTER — Encounter: Payer: Self-pay | Admitting: Family Medicine

## 2022-09-19 ENCOUNTER — Other Ambulatory Visit (HOSPITAL_COMMUNITY)
Admission: RE | Admit: 2022-09-19 | Discharge: 2022-09-19 | Disposition: A | Discharge status: Home / Self Care | Payer: Managed Care, Other (non HMO) | Source: Ambulatory Visit | Attending: Family Medicine | Admitting: Family Medicine

## 2022-09-19 VITALS — BP 126/78 | HR 85 | Temp 97.6°F | Resp 16 | Ht 66.0 in | Wt 193.6 lb

## 2022-09-19 DIAGNOSIS — M545 Low back pain, unspecified: Secondary | ICD-10-CM | POA: Diagnosis not present

## 2022-09-19 DIAGNOSIS — N760 Acute vaginitis: Secondary | ICD-10-CM | POA: Insufficient documentation

## 2022-09-19 DIAGNOSIS — M5136 Other intervertebral disc degeneration, lumbar region: Secondary | ICD-10-CM | POA: Insufficient documentation

## 2022-09-19 DIAGNOSIS — M47816 Spondylosis without myelopathy or radiculopathy, lumbar region: Secondary | ICD-10-CM | POA: Diagnosis not present

## 2022-09-19 DIAGNOSIS — R300 Dysuria: Secondary | ICD-10-CM

## 2022-09-19 LAB — POCT URINALYSIS DIPSTICK
Bilirubin, UA: NEGATIVE
Glucose, UA: NEGATIVE
Ketones, UA: NEGATIVE
Leukocytes, UA: NEGATIVE
Nitrite, UA: NEGATIVE
Odor: NORMAL
Protein, UA: NEGATIVE
Spec Grav, UA: 1.02 (ref 1.010–1.025)
Urobilinogen, UA: 0.2 E.U./dL
pH, UA: 5 (ref 5.0–8.0)

## 2022-09-19 NOTE — Progress Notes (Unsigned)
Patient ID: Monica Hardin, female    DOB: 1966-05-25, 56 y.o.   MRN: [id number]  PCP: Steele Sizer, MD  Chief Complaint  Patient presents with   Dysuria    Burning/itching   Urinary Frequency    Sx on going for weeks   Flank Pain    Left and radiates across lower back   Foot Swelling    Bilateral feet    Subjective:   Monica Hardin is a 56 y.o. female, presents to clinic with CC of the following: She endorses urinary frequency sometimes with discomfort or itching, some genital and vulvovaginal irritation or itching, she is had back pain for months -located to low back and hips sometimes radiates around pelvis or down legs -back pain has been ongoing for months urinary symptoms have been slightly more recent She sent Dr. Ancil Boozer a message about some of her symptoms hoping to that address them at an upcoming annual visit and she was told to get an appointment today no hx of recurrent UTI or kidney stone She denies any hematuria She is status post total abdominal hysterectomy and bilateral oophorectomy and is on oral estrogen replacement therapy, she endorses no genital rash or lesions, no pain or dryness or concerns with sex.  She denies vaginal discharge Urinary Tract Infection  This is a new problem. The current episode started more than 1 year ago. She is Sexually active. There is No history of pyelonephritis. Associated symptoms include a discharge, frequency and urgency. Pertinent negatives include no chills, flank pain, hematuria, hesitancy, nausea, possible pregnancy, sweats or vomiting. There is no history of recurrent UTIs, a single kidney, urinary stasis or a urological procedure.    Back pain she has had for months she also has severe fibromyalgia and prior work-up for her neck with consulting with a spine specialist and a cervical MRI She reports no history of going to PT in the past, she is on multiple medications for her fibromyalgia pain She does have chronic  swelling in her legs and feet and today is not significantly different from her baseline  She denies any recent falls or strenuous activity, she cannot tell me any alleviating or aggravating factors regarding her back pain She denies any abdominal pain, suprapubic pressure, nausea, vomiting, fever sweats chills  Patient Active Problem List   Diagnosis Date Noted   Varicose veins of both lower extremities 02/27/2019   Primary osteoarthritis of both hands 06/15/2018   Primary osteoarthritis of both knees 06/15/2018   Primary osteoarthritis of both feet 06/15/2018   Positive ANA (antinuclear antibody) 06/15/2018   Fibromyalgia 06/15/2018   Encounter for screening colonoscopy 11/07/2016   Surgical menopause on hormone replacement therapy 09/01/2016   Vitamin D deficiency 04/20/2016   Low vitamin B12 level 04/20/2016   History of uterine leiomyoma 04/19/2016   Sciatica, right side 04/19/2016   Status post TAH-BSO 11/12/2015   History of ovarian cyst 11/12/2015   Dyslipidemia 07/04/2015   Adult hypothyroidism 07/04/2015   Excess weight 07/04/2015   Disturbance of skin sensation 07/04/2015   Lipoma of arm 12/03/2014   CFIDS (chronic fatigue and immune dysfunction syndrome) ([place]) 05/08/2007      Current Outpatient Medications:    DULoxetine (CYMBALTA) 60 MG capsule, Take 1 capsule (60 mg total) by mouth daily., Disp: 90 capsule, Rfl: 1   estradiol (ESTRACE) 0.5 MG tablet, Take 0.5-1 tablets (0.25-0.5 mg total) by mouth daily., Disp: 90 tablet, Rfl: 1   levothyroxine (SYNTHROID) 125 MCG tablet,  Take 125 mcg by mouth daily., Disp: , Rfl:    pregabalin (LYRICA) 75 MG capsule, Take 1 capsule (75 mg total) by mouth 3 (three) times daily., Disp: 270 capsule, Rfl: 1   traZODone (DESYREL) 50 MG tablet, Take 1 tablet (50 mg total) by mouth at bedtime., Disp: 90 tablet, Rfl: 1   Vitamin D, Ergocalciferol, (DRISDOL) 1.25 MG (50000 UNIT) CAPS capsule, Take 1 capsule (50,000 Units total) by mouth  once a week., Disp: 12 capsule, Rfl: 1   Cyanocobalamin (B-12) 1000 MCG SUBL, Place 1 tablet under the tongue 3 (three) times a week. (Patient not taking: Reported on 09/19/2022), Disp: 90 tablet, Rfl: 1   rosuvastatin (CRESTOR) 5 MG tablet, Take 1 tablet (5 mg total) by mouth daily. (Patient not taking: Reported on 09/19/2022), Disp: 90 tablet, Rfl: 1   No Known Allergies   Social History   Tobacco Use   Smoking status: Never   Smokeless tobacco: Never  Vaping Use   Vaping Use: Never used  Substance Use Topics   Alcohol use: No    Alcohol/week: 0.0 standard drinks of alcohol   Drug use: Never      Chart Review Today: I have reviewed the patient's medical history in detail and updated the computerized patient record.   Review of Systems  Constitutional: Negative.  Negative for chills.  HENT: Negative.    Eyes: Negative.   Respiratory: Negative.    Cardiovascular: Negative.   Gastrointestinal: Negative.  Negative for nausea and vomiting.  Endocrine: Negative.   Genitourinary:  Positive for frequency and urgency. Negative for flank pain, hematuria and hesitancy.  Musculoskeletal: Negative.   Skin: Negative.   Allergic/Immunologic: Negative.   Neurological: Negative.   Hematological: Negative.   Psychiatric/Behavioral: Negative.    All other systems reviewed and are negative.      Objective:   Vitals:   09/19/22 1421  BP: 126/78  Pulse: 85  Resp: 16  Temp: 97.6 F (36.4 C)  TempSrc: Oral  SpO2: 96%  Weight: 193 lb 9.6 oz (87.8 kg)  Height: '5\' 6"'$  (1.676 m)    Body mass index is 31.25 kg/m.  Physical Exam Vitals and nursing note reviewed.  Constitutional:      General: She is not in acute distress.    Appearance: Normal appearance. She is well-developed and overweight. She is not ill-appearing, toxic-appearing or diaphoretic.  HENT:     Head: Normocephalic and atraumatic.     Right Ear: External ear normal.     Left Ear: External ear normal.  Eyes:      General: Lids are normal. No scleral icterus.       Right eye: No discharge.        Left eye: No discharge.     Conjunctiva/sclera: Conjunctivae normal.  Neck:     Trachea: Phonation normal. No tracheal deviation.  Cardiovascular:     Rate and Rhythm: Normal rate and regular rhythm.     Pulses: Normal pulses.          Radial pulses are 2+ on the right side and 2+ on the left side.       Posterior tibial pulses are 2+ on the right side and 2+ on the left side.     Heart sounds: Normal heart sounds. No murmur heard.    No friction rub. No gallop.  Pulmonary:     Effort: Pulmonary effort is normal. No respiratory distress.     Breath sounds: Normal breath sounds. No stridor. No  wheezing, rhonchi or rales.  Chest:     Chest wall: No tenderness.  Abdominal:     General: Bowel sounds are normal. There is no distension.     Palpations: Abdomen is soft.     Tenderness: There is no abdominal tenderness. There is no right CVA tenderness, left CVA tenderness, guarding or rebound.  Musculoskeletal:     Right lower leg: No edema.     Left lower leg: No edema.     Comments: No midline tenderness or step-off from cervical to lumbar spine Negative straight leg raise bilaterally  Skin:    General: Skin is warm and dry.     Coloration: Skin is not jaundiced or pale.     Findings: No rash.  Neurological:     Mental Status: She is alert.     Motor: No weakness or abnormal muscle tone.     Coordination: Coordination normal.     Gait: Gait normal.  Psychiatric:        Mood and Affect: Mood normal.        Speech: Speech normal.        Behavior: Behavior normal. Behavior is cooperative.      Results for orders placed or performed in visit on 09/19/22  POCT Urinalysis Dipstick  Result Value Ref Range   Color, UA Yellow    Clarity, UA Clear    Glucose, UA Negative Negative   Bilirubin, UA Negative    Ketones, UA Negative    Spec Grav, UA 1.020 1.010 - 1.025   Blood, UA Trace    pH, UA 5.0  5.0 - 8.0   Protein, UA Negative Negative   Urobilinogen, UA 0.2 0.2 or 1.0 E.U./dL   Nitrite, UA Negative    Leukocytes, UA Negative Negative   Appearance Yellow    Odor Normal        Assessment & Plan:   1. Dysuria Patient reports urinary frequency and some discomfort or itching with urination -the symptoms have been noted gradually possibly over the past couple months or since the beginning of summer. Her abdominal exam was unremarkable She had no CVA tenderness Her dip was unremarkable only had trace amount of blood and without a overwhelming history concerning for an acute recent UTI and I have no concerns for [place] today I we will wait for lab results before treating with antibiotics, the patient is in agreement with this There is no history of pyelonephritis, UTI or kidney stones - POCT Urinalysis Dipstick - Urine Culture  2. Bilateral low back pain, unspecified chronicity, unspecified whether sciatica present Difficult exam due to her history of fibromyalgia which is fairly severe She endorses generalized low back pain which seems to be from lumbar spine to sacral and to SI joints bilaterally with a negative straight leg raise today Overall she has good range of motion, good sensation and strength There is no injury but with months of pain and with history of cervical stenosis or degenerative disc disease I do feel it is appropriate to get a screening x-ray She is already maxed out on several medications for her fibromyalgia pain, she can do Tylenol, NSAIDs if able, I offered muscle relaxers, and I strongly encouraged her to consult with physical therapy which may be helpful for her low back pain. I explained that usually some physical therapy some medical management and screening x-rays are usually needed before an MRI can be approved if she happens to me that in the future She also may  choose to follow-up with the spine specialist that she saw previously for her cervical spine I  am not overly concerned for her back pain to be necessarily related to all of her other symptoms which she is come in for today - DG Lumbar Spine Complete; Future  3. Vulvovaginitis She endorses some vulvovaginal irritation without any discharge, she is low risk, monogamous with her husband for many decades, she is not having any dyspareunia or difficulties or dryness with sex.  She is status post TAH-SBO -with some mild irritation explained that we could do a vaginal swab and make sure there is no yeast or BV that needs to be treated but this can sometimes give GU symptoms -she agrees to the swab today and treating anything that is positive Also discussed that some women over 50 can develop GU sx from hormonal and tissue changes and sometimes vaginal estrogens are effective at preventing the symptoms.  Patient is currently on oral estrogen replacement - Cervicovaginal ancillary only  We will treat pending results Patient does express additionally that she still wants to know what Dr. Ancil Boozer thinks of all of her symptoms - will route chart to PCP or pt can f/up with her for her upcoming appt    Delsa Grana, PA-C 09/19/22 2:44 PM

## 2022-09-20 LAB — URINE CULTURE
MICRO NUMBER:: 13964656
SPECIMEN QUALITY:: ADEQUATE

## 2022-09-21 ENCOUNTER — Encounter: Payer: Self-pay | Admitting: Family Medicine

## 2022-09-21 ENCOUNTER — Other Ambulatory Visit: Payer: Self-pay

## 2022-09-21 DIAGNOSIS — M545 Low back pain, unspecified: Secondary | ICD-10-CM

## 2022-09-21 DIAGNOSIS — M5136 Other intervertebral disc degeneration, lumbar region: Secondary | ICD-10-CM

## 2022-09-21 LAB — CERVICOVAGINAL ANCILLARY ONLY
Bacterial Vaginitis (gardnerella): NEGATIVE
Candida Glabrata: NEGATIVE
Candida Vaginitis: NEGATIVE
Comment: NEGATIVE
Comment: NEGATIVE
Comment: NEGATIVE

## 2022-10-05 ENCOUNTER — Other Ambulatory Visit: Payer: Self-pay | Admitting: Family Medicine

## 2022-10-05 DIAGNOSIS — R232 Flushing: Secondary | ICD-10-CM

## 2022-10-12 NOTE — Progress Notes (Signed)
Referring Physician:  Delsa Grana, PA-C [address] [place],  [place] [address]  Primary Physician:  Steele Sizer, MD  History of Present Illness: 10/12/2022 Ms. Chanise Habeck is here today with a chief complaint of LBP with right lateral leg pain to her foot x 6 months. Had flare up a few weeks ago, she is better but still has pain.   Now she has constant LBP with intermittent right leg pain laterally to her foot. Pain is worse with walking and prolonged sitting. She is better with sitting. Grocery cart helps. She has some numbness in right leg. She has diffuse muscle weakness x years that they think is from her Hashimoto's.   She tried some back exercises from internet- did not help. No PT, lumbar injections. No bowel or bladder dysfunction.   History of severe FM, Hashimoto's hypothyroidism, and chronic fatigue and immune dysfunction syndrome. She does not smoke.   Has seen [facility] in the past for her cervical spine.    Conservative measures:  Physical therapy: no, has tried HEP from internet.   Multimodal medical therapy including regular antiinflammatories: cymbalta, lyrica  Injections: No epidural steroid injections  Past Surgery: no surgery on her spine  ZEBA LUBY has no symptoms of cervical myelopathy.  The symptoms are causing a significant impact on the patient's life.   Review of Systems:  A 10 point review of systems is negative, except for the pertinent positives and negatives detailed in the HPI.  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   Chronic fatigue    Hyperlipidemia    Hypothyroidism    Overweight    Primary osteoarthritis of both hands 06/15/2018   Sciatica, right side    Thyroid disease     Past Surgical History: Past Surgical History:  Procedure Laterality Date   ABDOMINAL HYSTERECTOMY N/A 10/05/2015   Procedure: HYSTERECTOMY ABDOMINAL;  Surgeon:  [name], MD;  Location: [facility];  Service: Gynecology;  Laterality: N/A;   CHOLECYSTECTOMY N/A 03/27/2017   Procedure: LAPAROSCOPIC CHOLECYSTECTOMY;  Surgeon: Olean Ree, MD;  Location: [facility];  Service: General;  Laterality: N/A;   COLONOSCOPY WITH PROPOFOL N/A 11/22/2016   Procedure: COLONOSCOPY WITH PROPOFOL;  Surgeon: Robert Bellow, MD;  Location: [facility];  Service: Endoscopy;  Laterality: N/A;   FRACTURE SURGERY     LAPAROSCOPY N/A 08/24/2015   Procedure: LAPAROSCOPY DIAGNOSTIC;  Surgeon: [name], MD;  Location: [facility];  Service: Gynecology;  Laterality: N/A;   LEEP  [id number]   [facility]   MANDIBLE FRACTURE SURGERY  1996   SALPINGOOPHORECTOMY Bilateral 10/05/2015   Procedure: SALPINGO OOPHORECTOMY;  Surgeon: [name], MD;  Location: [facility];  Service: Gynecology;  Laterality: Bilateral;    Allergies: Allergies as of 10/14/2022   (No Known Allergies)    Medications: Outpatient Encounter Medications as of 10/14/2022  Medication Sig   Cyanocobalamin (B-12) 1000 MCG SUBL Place 1 tablet under the tongue 3 (three) times a week. (Patient not taking: Reported on 09/19/2022)   DULoxetine (CYMBALTA) 60 MG capsule Take 1 capsule (60 mg total) by mouth daily.   estradiol (ESTRACE) 0.5 MG tablet TAKE 1/2 TO 1 TABLET(0.25 TO 0.5 MG) BY MOUTH DAILY   levothyroxine (SYNTHROID) 125 MCG tablet Take 125 mcg by mouth daily.   pregabalin (LYRICA) 75 MG capsule Take 1 capsule (75 mg total) by mouth 3 (three) times daily.  rosuvastatin (CRESTOR) 5 MG tablet Take 1 tablet (5 mg total) by mouth daily. (Patient not taking: Reported on 09/19/2022)   traZODone (DESYREL) 50 MG tablet Take 1 tablet (50 mg total) by mouth at bedtime.   Vitamin D, Ergocalciferol, (DRISDOL) 1.25 MG (50000 UNIT) CAPS capsule Take 1 capsule (50,000 Units total) by mouth once a week.   No facility-administered encounter medications on file as of 10/14/2022.    Social  History: Social History   Tobacco Use   Smoking status: Never   Smokeless tobacco: Never  Vaping Use   Vaping Use: Never used  Substance Use Topics   Alcohol use: No    Alcohol/week: 0.0 standard drinks of alcohol   Drug use: Never    Family Medical History: Family History  Problem Relation Age of Onset   Lung cancer Mother    Cancer Mother    Lung cancer Father    Cancer Father    Kidney Stones Daughter    Asthma Son    Breast cancer Paternal Aunt 106   Diabetes Paternal Grandmother    Thyroid disease Sister     Physical Examination: There were no vitals filed for this visit.  General: Patient is well developed, well nourished, calm, collected, and in no apparent distress. Attention to examination is appropriate.  Respiratory: Patient is breathing without any difficulty.   NEUROLOGICAL:     Awake, alert, oriented to person, place, and time.  Speech is clear and fluent. Fund of knowledge is appropriate.   Cranial Nerves: Pupils equal round and reactive to light.  Facial tone is symmetric.  Facial sensation is symmetric.  ROM of spine:  Limited ROM of lumbar spine with pain  She has diffuse lower lumbar tenderness.   No abnormal lesions on exposed skin.   Strength: Side Biceps Triceps Deltoid Interossei Grip Wrist Ext. Wrist Flex.  R '4 4 4 4 4 4 4  '$ L '4 4 4 4 4 4 4   '$ Side Iliopsoas Quads Hamstring PF DF EHL  R '4 4 4 4 '$ 4- 4-  L '4 4 4 4 '$ 4- 4-   History of chronic muscle weakness for years per patient. She's had workup done and was told it was from [place].  Reflexes are 2+ and symmetric at the biceps, triceps, brachioradialis, patella and achilles.   Hoffman's is absent.  Clonus is not present.   Bilateral upper and lower extremity sensation is intact to light touch.     Gait is normal.    Medical Decision Making  Imaging: Lumbar xrays dated 09/19/22:  FINDINGS: L4-5 and L5-S1 degenerative disc disease. Lower lumbar spine facet degenerative  changes. Preservation of the vertebral body heights. SI joints are unremarkable. Stool throughout the colon. Cholecystectomy clips.   IMPRESSION: Lower lumbar spine degenerative disc and facet disease.     Electronically Signed   By: Lovey Newcomer M.D.   On: 09/20/2022 14:07  I have personally reviewed the images and agree with the above interpretation, except she may have slight slip at L4-L5 and L5-S1.   Assessment and Plan: Ms. Calabria is a pleasant 56 y.o. female with 6 month history of constant LBP with intermittent right leg pain laterally to her foot. Pain is worse with walking and prolonged standing. Has severe flare up a a few weeks ago.   She has known DDD and spondylosis at L4-S1 along with slight slip L4-L5 and L5-S1.   She has diffuse muscle weakness on her exam. She's had this for  years. Workup done and she was told it was from [place].   Treatment options discussed with patient and following plan made:   - MRI of lumbar spine to further evaluate lumbar radiculopathy. No improvement with time or medications.  - PT recommended for lumbar spine, but she likely would be limited by FM and Hashimoto's (muscle weakness). Will hold off for now.  - Depending on results of MRI, may consider lumbar injections.  - Will schedule her follow up to see me in clinic after her MRI to review results.  - Continue lyrica and cymbalta from other providers.   I spent a total of 30 minutes in face-to-face and non-face-to-face activities related to this patient's care toda including review of outside records, review of imaging, review of symptoms, physical exam, discussion of differential diagnosis, discussion of treatment options, and documentation.   Thank you for involving me in the care of this patient.   Geronimo Boot PA-C Dept. of Neurosurgery

## 2022-10-14 ENCOUNTER — Ambulatory Visit: Payer: Managed Care, Other (non HMO) | Admitting: Orthopedic Surgery

## 2022-10-14 ENCOUNTER — Encounter: Payer: Self-pay | Admitting: Orthopedic Surgery

## 2022-10-14 VITALS — BP 126/80 | Ht 66.0 in | Wt 193.8 lb

## 2022-10-14 DIAGNOSIS — M5136 Other intervertebral disc degeneration, lumbar region: Secondary | ICD-10-CM | POA: Diagnosis not present

## 2022-10-14 DIAGNOSIS — M4316 Spondylolisthesis, lumbar region: Secondary | ICD-10-CM | POA: Diagnosis not present

## 2022-10-14 DIAGNOSIS — M5416 Radiculopathy, lumbar region: Secondary | ICD-10-CM

## 2022-10-14 DIAGNOSIS — M51369 Other intervertebral disc degeneration, lumbar region without mention of lumbar back pain or lower extremity pain: Secondary | ICD-10-CM

## 2022-10-26 ENCOUNTER — Ambulatory Visit
Admission: RE | Admit: 2022-10-26 | Discharge: 2022-10-26 | Disposition: A | Discharge status: Home / Self Care | Payer: Managed Care, Other (non HMO) | Source: Ambulatory Visit | Attending: Orthopedic Surgery | Admitting: Orthopedic Surgery

## 2022-10-26 DIAGNOSIS — M5416 Radiculopathy, lumbar region: Secondary | ICD-10-CM | POA: Insufficient documentation

## 2022-10-26 DIAGNOSIS — M4316 Spondylolisthesis, lumbar region: Secondary | ICD-10-CM | POA: Diagnosis present

## 2022-10-26 DIAGNOSIS — M5136 Other intervertebral disc degeneration, lumbar region: Secondary | ICD-10-CM | POA: Insufficient documentation

## 2022-10-28 NOTE — Telephone Encounter (Signed)
Patient calling to check if her MRI results are in yet. I notified her that the report is not in her chart yet. We will call her once the report is received and reviewed, she agreed.

## 2022-10-28 NOTE — Telephone Encounter (Signed)
See chart- Patty spoke with patient.

## 2022-10-30 ENCOUNTER — Ambulatory Visit
Admission: EM | Admit: 2022-10-30 | Discharge: 2022-10-30 | Disposition: A | Discharge status: Home / Self Care | Payer: Managed Care, Other (non HMO) | Attending: Emergency Medicine | Admitting: Emergency Medicine

## 2022-10-30 DIAGNOSIS — J0100 Acute maxillary sinusitis, unspecified: Secondary | ICD-10-CM | POA: Diagnosis not present

## 2022-10-30 DIAGNOSIS — H66001 Acute suppurative otitis media without spontaneous rupture of ear drum, right ear: Secondary | ICD-10-CM | POA: Diagnosis not present

## 2022-10-30 MED ORDER — IPRATROPIUM BROMIDE 0.06 % NA SOLN: 2.0000 | Freq: Four times a day (QID) | NASAL | 12 refills | Status: DC

## 2022-10-30 MED ORDER — AMOXICILLIN-POT CLAVULANATE 875-125 MG PO TABS: 1.0000 | ORAL_TABLET | Freq: Two times a day (BID) | ORAL | 0 refills | Status: AC

## 2022-10-30 NOTE — ED Provider Notes (Signed)
[facility]    CSN: [id number] Arrival date & time: 10/30/22  0802      History   Chief Complaint Chief Complaint  Patient presents with   Nasal Congestion   Ear Fullness    Right     HPI Monica Hardin is a 56 y.o. female.   HPI  56 year old female here for evaluation of upper respiratory complaints.  The patient is reporting that 8 days ago she developed nasal congestion and sinus pressure she is experiencing green nasal discharge.  She is also had multiple nosebleeds this past week.  Additionally, she has a fullness and decreased hearing in her right ear and this is causing intermittent dizziness.  She is not dizzy at present.  She denies any fever, ringing in her ear, or cough.  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   Chronic fatigue    Hyperlipidemia    Hypothyroidism    Overweight    Primary osteoarthritis of both hands 06/15/2018   Sciatica, right side    Thyroid disease     Patient Active Problem List   Diagnosis Date Noted   Varicose veins of both lower extremities 02/27/2019   Primary osteoarthritis of both hands 06/15/2018   Primary osteoarthritis of both knees 06/15/2018   Primary osteoarthritis of both feet 06/15/2018   Positive ANA (antinuclear antibody) 06/15/2018   Fibromyalgia 06/15/2018   Encounter for screening colonoscopy 11/07/2016   Surgical menopause on hormone replacement therapy 09/01/2016   Vitamin D deficiency 04/20/2016   Low vitamin B12 level 04/20/2016   History of uterine leiomyoma 04/19/2016   Sciatica, right side 04/19/2016   Status post TAH-BSO 11/12/2015   History of ovarian cyst 11/12/2015   Dyslipidemia 07/04/2015   Adult hypothyroidism 07/04/2015   Excess weight 07/04/2015   Disturbance of skin sensation 07/04/2015   Lipoma of arm 12/03/2014   CFIDS (chronic fatigue and immune dysfunction syndrome) ([place]) 05/08/2007    Past Surgical History:  Procedure Laterality Date    ABDOMINAL HYSTERECTOMY N/A 10/05/2015   Procedure: HYSTERECTOMY ABDOMINAL;  Surgeon: [name], MD;  Location: [facility];  Service: Gynecology;  Laterality: N/A;   CHOLECYSTECTOMY N/A 03/27/2017   Procedure: LAPAROSCOPIC CHOLECYSTECTOMY;  Surgeon: Olean Ree, MD;  Location: [facility];  Service: General;  Laterality: N/A;   COLONOSCOPY WITH PROPOFOL N/A 11/22/2016   Procedure: COLONOSCOPY WITH PROPOFOL;  Surgeon: Robert Bellow, MD;  Location: [facility];  Service: Endoscopy;  Laterality: N/A;   FRACTURE SURGERY     LAPAROSCOPY N/A 08/24/2015   Procedure: LAPAROSCOPY DIAGNOSTIC;  Surgeon: [name], MD;  Location: [facility];  Service: Gynecology;  Laterality: N/A;   LEEP  [id number]   [facility]   MANDIBLE FRACTURE SURGERY  1996   SALPINGOOPHORECTOMY Bilateral 10/05/2015   Procedure: SALPINGO OOPHORECTOMY;  Surgeon: [name], MD;  Location: [facility];  Service: Gynecology;  Laterality: Bilateral;    OB History     Gravida  3   Para  2   Term  2   Preterm      AB  1   Living  2      SAB  1   IAB      Ectopic      Multiple      Live Births  2        Obstetric Comments  1st Menstrual Cycle:   ? 1st Pregnancy:  34  Home Medications    Prior to Admission medications   Medication Sig Start Date End Date Taking? Authorizing Provider  amoxicillin-clavulanate (AUGMENTIN) 875-125 MG tablet Take 1 tablet by mouth every 12 (twelve) hours for 10 days. 10/30/22 11/09/22 Yes Margarette Canada, NP  ipratropium (ATROVENT) 0.06 % nasal spray Place 2 sprays into both nostrils 4 (four) times daily. 10/30/22  Yes Margarette Canada, NP  DULoxetine (CYMBALTA) 60 MG capsule Take 1 capsule (60 mg total) by mouth daily. 03/07/22   Steele Sizer, MD  estradiol (ESTRACE) 0.5 MG tablet TAKE 1/2 TO 1 TABLET(0.25 TO 0.5 MG) BY MOUTH DAILY 10/05/22   Steele Sizer, MD  levothyroxine (SYNTHROID) 125 MCG tablet Take 125 mcg by mouth daily. 07/07/20   [provider]  pregabalin (LYRICA) 75 MG capsule Take 1 capsule (75 mg total) by mouth 3 (three) times daily. 03/07/22   Steele Sizer, MD  traZODone (DESYREL) 50 MG tablet Take 1 tablet (50 mg total) by mouth at bedtime. 03/07/22   Steele Sizer, MD  Vitamin D, Ergocalciferol, (DRISDOL) 1.25 MG (50000 UNIT) CAPS capsule Take 1 capsule (50,000 Units total) by mouth once a week. 09/07/21   Steele Sizer, MD    Family History Family History  Problem Relation Age of Onset   Lung cancer Mother    Cancer Mother    Lung cancer Father    Cancer Father    Kidney Stones Daughter    Asthma Son    Breast cancer Paternal Aunt 43   Diabetes Paternal Grandmother    Thyroid disease Sister     Social History Social History   Tobacco Use   Smoking status: Never   Smokeless tobacco: Never  Vaping Use   Vaping Use: Never used  Substance Use Topics   Alcohol use: No    Alcohol/week: 0.0 standard drinks of alcohol   Drug use: Never     Allergies   Patient has no known allergies.   Review of Systems Review of Systems  Constitutional:  Negative for fever.  HENT:  Positive for congestion, ear pain, hearing loss, nosebleeds, postnasal drip, rhinorrhea, sinus pressure and sinus pain. Negative for ear discharge, sore throat and tinnitus.   Respiratory:  Negative for cough.      Physical Exam Triage Vital Signs ED Triage Vitals  Enc Vitals Group     BP 10/30/22 0808 115/69     Pulse Rate 10/30/22 0808 86     Resp --      Temp 10/30/22 0808 98.6 F (37 C)     Temp Source 10/30/22 0808 Oral     SpO2 10/30/22 0808 95 %     Weight 10/30/22 0807 190 lb (86.2 kg)     Height 10/30/22 0807 _0  (1.676 m)     Head Circumference --      Peak Flow --      Pain Score --      Pain Loc --      Pain Edu? --      Excl. in [place]? --    No data found.  Updated Vital Signs BP 115/69 (BP Location: Left Arm)   Pulse 86   Temp 98.6 F (37 C) (Oral)   Ht _1  (1.676 m)   Wt 190 lb (86.2  kg)   LMP 09/29/2015 Comment: upreg neg  SpO2 95%   BMI 30.67 kg/m   Visual Acuity Right Eye Distance:   Left Eye Distance:   Bilateral Distance:    Right  Eye Near:   Left Eye Near:    Bilateral Near:     Physical Exam Vitals and nursing note reviewed.  Constitutional:      Appearance: Normal appearance. She is not ill-appearing.  HENT:     Head: Normocephalic and atraumatic.     Right Ear: Ear canal and external ear normal. There is no impacted cerumen.     Left Ear: Tympanic membrane, ear canal and external ear normal. There is no impacted cerumen.     Ears:     Comments: Right tympanic membrane is erythematous and injected with loss of landmarks.  The external auditory canal is clear.  The left tympanic membrane appears pearly gray at the upper aspect.  There is a large amount of cerumen in the external auditory canal obscuring complete visualization.    Nose: Congestion and rhinorrhea present.     Comments: Nasal mucosa is erythematous and edematous with thick yellow discharge in both nares.    Mouth/Throat:     Mouth: Mucous membranes are moist.     Pharynx: Oropharynx is clear. Posterior oropharyngeal erythema present.     Comments: Posterior oropharynx is erythematous injected with yellow postnasal drip. Cardiovascular:     Rate and Rhythm: Normal rate and regular rhythm.     Pulses: Normal pulses.     Heart sounds: Normal heart sounds. No murmur heard.    No friction rub. No gallop.  Pulmonary:     Effort: Pulmonary effort is normal.     Breath sounds: Normal breath sounds. No wheezing, rhonchi or rales.  Musculoskeletal:     Cervical back: Normal range of motion and neck supple.  Lymphadenopathy:     Cervical: No cervical adenopathy.  Skin:    General: Skin is warm and dry.     Capillary Refill: Capillary refill takes less than 2 seconds.     Findings: No erythema or rash.  Neurological:     General: No focal deficit present.     Mental Status: She is alert  and oriented to person, place, and time.  Psychiatric:        Mood and Affect: Mood normal.        Behavior: Behavior normal.        Thought Content: Thought content normal.        Judgment: Judgment normal.      UC Treatments / Results  Labs (all labs ordered are listed, but only abnormal results are displayed) Labs Reviewed - No data to display  EKG   Radiology No results found.  Procedures Procedures (including critical care time)  Medications Ordered in UC Medications - No data to display  Initial Impression / Assessment and Plan / UC Course  I have reviewed the triage vital signs and the nursing notes.  Pertinent labs & imaging results that were available during my care of the patient were reviewed by me and considered in my medical decision making (see chart for details).   Patient is a very pleasant, nontoxic-appearing 56 year old female here for evaluation of upper respiratory complaints outlined in HPI above.  Her exam does reveal inflamed nasal mucosa with purulent nasal discharge and tenderness to percussion of bilateral maxillary sinuses.  Her right tympanic membrane is also erythematous, injected, and misshapen.  Her cardiopulmonary exam is benign.  Her exam is consistent with maxillary sinusitis and acute otitis media that I will treat with Augmentin 875 twice daily for 10 days.  I have also discussed performing sinus irrigation to help  alleviate her mucus burden and using over-the-counter Mucinex to help thin out the stickiness of her mucus.  This also requires increased water consumption to help keep her secretions then.  I will prescribe Atrovent nasal spray that she can use as needed to help with her congestion.  Return precautions reviewed.   Final Clinical Impressions(s) / UC Diagnoses   Final diagnoses:  Acute non-recurrent maxillary sinusitis  Non-recurrent acute suppurative otitis media of right ear without spontaneous rupture of tympanic membrane      Discharge Instructions      The Augmentin twice daily with food for 10 days for treatment of your sinusitis.  Perform sinus irrigation 2-3 times a day with a [employer]Med sinus rinse kit and distilled water.  Do not use tap water.  You can use plain over-the-counter Mucinex every 6 hours to break up the stickiness of the mucus so your body can clear it.  Increase your oral fluid intake to thin out your mucus so that is also able for your body to clear more easily.  Take an over-the-counter probiotic, such as Culturelle-align-activia, 1 hour after each dose of antibiotic to prevent diarrhea.  Use the Atrovent nasal spray every 6 hours as needed for congestion and sinus drainage. You can instill 2 squirts in each nostril every 6 hours.  If you develop any new or worsening symptoms return for reevaluation or see your primary care provider.      ED Prescriptions     Medication Sig Dispense Auth. Provider   amoxicillin-clavulanate (AUGMENTIN) 875-125 MG tablet Take 1 tablet by mouth every 12 (twelve) hours for 10 days. 20 tablet Margarette Canada, NP   ipratropium (ATROVENT) 0.06 % nasal spray Place 2 sprays into both nostrils 4 (four) times daily. 15 mL Margarette Canada, NP      PDMP not reviewed this encounter.   Margarette Canada, NP 10/30/22 [phone]

## 2022-10-30 NOTE — ED Triage Notes (Signed)
Patient presents to UC for nasal congestion with green mucus and can't hear out of her right ear.   Patient reports started last Sunday.

## 2022-10-30 NOTE — Discharge Instructions (Addendum)
The Augmentin twice daily with food for 10 days for treatment of your sinusitis.  Perform sinus irrigation 2-3 times a day with a NeilMed sinus rinse kit and distilled water.  Do not use tap water.  You can use plain over-the-counter Mucinex every 6 hours to break up the stickiness of the mucus so your body can clear it.  Increase your oral fluid intake to thin out your mucus so that is also able for your body to clear more easily.  Take an over-the-counter probiotic, such as Culturelle-align-activia, 1 hour after each dose of antibiotic to prevent diarrhea.  Use the Atrovent nasal spray every 6 hours as needed for congestion and sinus drainage. You can instill 2 squirts in each nostril every 6 hours.  If you develop any new or worsening symptoms return for reevaluation or see your primary care provider.

## 2022-11-03 NOTE — Progress Notes (Signed)
Referring Physician:  Steele Sizer, [address],  [place] [id number]  Primary Physician:  Steele Sizer, MD  History of Present Illness: [name] has a history of severe FM, Hashimoto's hypothyroidism, and chronic fatigue and immune dysfunction syndrome. She does not smoke.   She had constant LBP with intermittent right leg pain laterally to her foot. Pain was worse with walking and prolonged standing. Had severe flare up a a few weeks ago.    She has known DDD and spondylosis at L4-S1 along with slight slip L4-L5 and L5-S1.    She had diffuse muscle weakness on her previous exam. She's had this for years. Workup done and she was told it was from [place].   She is here to review her lumbar MRI. We discussed PT at her last visit and she declined.   She continues with constant LBP with intermittent right leg pain laterally to her foot. Pain was worse with walking and prolonged standing.   Conservative measures:  Physical therapy: no, has tried HEP from internet.   Multimodal medical therapy including regular antiinflammatories: cymbalta, lyrica  Injections: No epidural steroid injections  Past Surgery: no surgery on her spine  Monica Hardin has no symptoms of cervical myelopathy.  The symptoms are causing a significant impact on the patient's life.   Review of Systems:  A 10 point review of systems is negative, except for the pertinent positives and negatives detailed in the HPI.  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   Chronic fatigue    Hyperlipidemia    Hypothyroidism    Overweight    Primary osteoarthritis of both hands 06/15/2018   Sciatica, right side    Thyroid disease     Past Surgical History: Past Surgical History:  Procedure Laterality Date   ABDOMINAL HYSTERECTOMY N/A 10/05/2015   Procedure: HYSTERECTOMY ABDOMINAL;  Surgeon: [name], MD;   Location: [facility] OR[facility];  Service: Gynecology;  Laterality: N/A;   CHOLECYSTECTOMY N/A 03/27/2017   Procedure: LAPAROSCOPIC CHOLECYSTECTOMY;  Surgeon: Olean Ree, MD;  Location: [facility];  Service: General;  Laterality: N/A;   COLONOSCOPY WITH PROPOFOL N/A 11/22/2016   Procedure: COLONOSCOPY WITH PROPOFOL;  Surgeon: Robert Bellow, MD;  Location: [facility];  Service: Endoscopy;  Laterality: N/A;   FRACTURE SURGERY     LAPAROSCOPY N/A 08/24/2015   Procedure: LAPAROSCOPY DIAGNOSTIC;  Surgeon: [name], MD;  Location: [facility];  Service: Gynecology;  Laterality: N/A;   LEEP  [id number]   [facility]   MANDIBLE FRACTURE SURGERY  1996   SALPINGOOPHORECTOMY Bilateral 10/05/2015   Procedure: SALPINGO OOPHORECTOMY;  Surgeon: [name], MD;  Location: [facility];  Service: Gynecology;  Laterality: Bilateral;    Allergies: Allergies as of 11/04/2022   (No Known Allergies)    Medications: Outpatient Encounter Medications as of 11/04/2022  Medication Sig   amoxicillin-clavulanate (AUGMENTIN) 875-125 MG tablet Take 1 tablet by mouth every 12 (twelve) hours for 10 days.   DULoxetine (CYMBALTA) 60 MG capsule Take 1 capsule (60 mg total) by mouth daily.   estradiol (ESTRACE) 0.5 MG tablet TAKE 1/2 TO 1 TABLET(0.25 TO 0.5 MG) BY MOUTH DAILY   ipratropium (ATROVENT) 0.06 % nasal spray Place 2 sprays into both nostrils 4 (four) times daily.   levothyroxine (SYNTHROID) 125 MCG tablet Take 125 mcg by mouth daily.   pregabalin (LYRICA) 75 MG capsule Take 1 capsule (  75 mg total) by mouth 3 (three) times daily.   traZODone (DESYREL) 50 MG tablet Take 1 tablet (50 mg total) by mouth at bedtime.   Vitamin D, Ergocalciferol, (DRISDOL) 1.25 MG (50000 UNIT) CAPS capsule Take 1 capsule (50,000 Units total) by mouth once a week.   No facility-administered encounter medications on file as of 11/04/2022.    Social History: Social History   Tobacco Use   Smoking status: Never   Smokeless tobacco:  Never  Vaping Use   Vaping Use: Never used  Substance Use Topics   Alcohol use: No    Alcohol/week: 0.0 standard drinks of alcohol   Drug use: Never    Family Medical History: Family History  Problem Relation Age of Onset   Lung cancer Mother    Cancer Mother    Lung cancer Father    Cancer Father    Kidney Stones Daughter    Asthma Son    Breast cancer Paternal Aunt 54   Diabetes Paternal Grandmother    Thyroid disease Sister     Physical Examination: There were no vitals filed for this visit.  Awake, alert, oriented to person, place, and time.  Speech is clear and fluent. Fund of knowledge is appropriate.   Cranial Nerves: Pupils equal round and reactive to light.  Facial tone is symmetric.  Facial sensation is symmetric.  No abnormal lesions on exposed skin.   Strength:  Side Iliopsoas Quads Hamstring PF DF EHL  R '4 4 4 4 '$ 4- 4-  L '4 4 4 4 '$ 4- 4-   History of chronic muscle weakness for years per patient. She's had workup done and was told it was from [place].   Bilateral lower extremity sensation is intact to light touch.     Normal gait.    Medical Decision Making  Imaging: Lumbar MRI scan dated 10/26/22:  FINDINGS: Segmentation:  Standard.   Alignment:  Minimal grade 1 anterolisthesis of L4 on L5.   Vertebrae: No acute fracture, evidence of discitis, or aggressive bone lesion.   Conus medullaris and cauda equina: Conus extends to the T12-L1 level. Conus and cauda equina appear normal.   Paraspinal and other soft tissues: No acute paraspinal abnormality.   Disc levels:   Disc spaces: Mild disc height loss at L5-S1. Mild disc desiccation at L3-4, L4-5 and L5-S1.   T12-L1: No significant disc bulge. No neural foraminal stenosis. No central canal stenosis.   L1-L2: No significant disc bulge. No neural foraminal stenosis. No central canal stenosis.   L2-L3: No significant disc bulge. No neural foraminal stenosis. No central canal stenosis.    L3-L4: No significant disc bulge. No neural foraminal stenosis. No central canal stenosis.   L4-L5: No significant disc bulge. No neural foraminal stenosis. No central canal stenosis. Mild bilateral facet arthropathy.   L5-S1: No significant disc bulge. No neural foraminal stenosis. No central canal stenosis. Mild bilateral facet arthropathy.   IMPRESSION: 1. Mild bilateral facet arthropathy at L4-5 and L5-S1. 2. No significant lumbar spine disc protrusion, foraminal stenosis or central canal stenosis. 3. No acute osseous injury of the lumbar spine.     Electronically Signed   By: Kathreen Devoid M.D.   On: 10/29/2022 10:09  I have personally reviewed the images and agree with the above interpretation.    Assessment and Plan: Ms. Marxen is a pleasant 56 y.o. female with constant LBP with intermittent right leg pain laterally to her foot. LBP > right leg pain. Only has right  leg pain when she has a flare up.   She has known DDD and spondylosis at L4-S1 along with slight slip L4-L5 and L5-S1. MRI shows facet hypertrophy at L4-S1. No central/foraminal stenosis.   She has diffuse muscle weakness on her exam. She's had this for years. Workup done and she was told it was from [place].   Treatment options discussed with patient and following plan made:   - Referral to PMR ([place]) to consider lumbar injections (?facet injections).  - We again discussed PT for lumbar spine. Will hold off now as she likely would be limited by FM and Hashimoto's (muscle weakness).  - She will f/u prn.  I spent a total of 20 minutes in face-to-face and non-face-to-face activities related to this patient's care toda including review of outside records, review of imaging, review of symptoms, physical exam, discussion of differential diagnosis, discussion of treatment options, and documentation.   Thank you for involving me in the care of this patient.   Geronimo Boot PA-C Dept. of Neurosurgery

## 2022-11-04 ENCOUNTER — Encounter: Payer: Self-pay | Admitting: Orthopedic Surgery

## 2022-11-04 ENCOUNTER — Ambulatory Visit: Payer: Managed Care, Other (non HMO) | Admitting: Family Medicine

## 2022-11-04 ENCOUNTER — Encounter: Payer: Self-pay | Admitting: Family Medicine

## 2022-11-04 ENCOUNTER — Ambulatory Visit (INDEPENDENT_AMBULATORY_CARE_PROVIDER_SITE_OTHER): Payer: Managed Care, Other (non HMO) | Admitting: Orthopedic Surgery

## 2022-11-04 VITALS — BP 126/78 | Ht 66.0 in | Wt 190.0 lb

## 2022-11-04 VITALS — BP 118/74 | HR 98 | Temp 97.7°F | Resp 16 | Ht 66.0 in | Wt 190.1 lb

## 2022-11-04 DIAGNOSIS — H6122 Impacted cerumen, left ear: Secondary | ICD-10-CM

## 2022-11-04 DIAGNOSIS — M47816 Spondylosis without myelopathy or radiculopathy, lumbar region: Secondary | ICD-10-CM

## 2022-11-04 DIAGNOSIS — H9193 Unspecified hearing loss, bilateral: Secondary | ICD-10-CM

## 2022-11-04 DIAGNOSIS — H9201 Otalgia, right ear: Secondary | ICD-10-CM

## 2022-11-04 DIAGNOSIS — H66001 Acute suppurative otitis media without spontaneous rupture of ear drum, right ear: Secondary | ICD-10-CM | POA: Diagnosis not present

## 2022-11-04 DIAGNOSIS — M4316 Spondylolisthesis, lumbar region: Secondary | ICD-10-CM

## 2022-11-04 MED ORDER — CIPROFLOXACIN-DEXAMETHASONE 0.3-0.1 % OT SUSP
4.0000 [drp] | Freq: Two times a day (BID) | OTIC | 0 refills | Status: AC
Start: 2022-11-04 — End: 2022-11-09

## 2022-11-04 NOTE — Patient Instructions (Signed)
Your right ear drop looks better than what was documented in the urgent care note - but its not totally normal.  I recommend you return to the office here in 2 weeks or so to have it rechecked and we can check your hearing at that time also.  If there are still abnormal findings or hearing we would then refer you to ENT or audiology for further evaluation and treatment.  Ears often feel blocked due to eustachian tube dysfunction Which is treated with antihistamines, intranasal steroid sprays and decongestants - which I recommend you start taking after you are done with the antibiotics (can take the steroid nasal spray and antihistamine now with antibiotics and start sudafed from behind the counter of pharmacy after completed with augmentin)  Eustachian Tube Dysfunction  Eustachian tube dysfunction refers to a condition in which a blockage develops in the narrow passage that connects the middle ear to the back of the nose (eustachian tube). The eustachian tube regulates air pressure in the middle ear by letting air move between the ear and nose. It also helps to drain fluid from the middle ear space. Eustachian tube dysfunction can affect one or both ears. When the eustachian tube does not function properly, air pressure, fluid, or both can build up in the middle ear. What are the causes? This condition occurs when the eustachian tube becomes blocked or cannot open normally. Common causes of this condition include: Ear infections. Colds and other infections that affect the nose, mouth, and throat (upper respiratory tract). Allergies. Irritation from cigarette smoke. Irritation from stomach acid coming up into the esophagus (gastroesophageal reflux). The esophagus is the part of the body that moves food from the mouth to the stomach. Sudden changes in air pressure, such as from descending in an airplane or scuba diving. Abnormal growths in the nose or throat, such as: Growths that line the nose  (nasal polyps). Abnormal growth of cells (tumors). Enlarged tissue at the back of the throat (adenoids). What increases the risk? You are more likely to develop this condition if: You smoke. You are overweight. You are a child who has: Certain birth defects of the mouth, such as cleft palate. Large tonsils or adenoids. What are the signs or symptoms? Common symptoms of this condition include: A feeling of fullness in the ear. Ear pain. Clicking or popping noises in the ear. Ringing in the ear (tinnitus). Hearing loss. Loss of balance. Dizziness. Symptoms may get worse when the air pressure around you changes, such as when you travel to an area of high elevation, fly on an airplane, or go scuba diving. How is this diagnosed? This condition may be diagnosed based on: Your symptoms. A physical exam of your ears, nose, and throat. Tests, such as those that measure: The movement of your eardrum. Your hearing (audiometry). How is this treated? Treatment depends on the cause and severity of your condition. In mild cases, you may relieve your symptoms by moving air into your ears. This is called "popping the ears." In more severe cases, or if you have symptoms of fluid in your ears, treatment may include: Medicines to relieve congestion (decongestants). Medicines that treat allergies (antihistamines). Nasal sprays or ear drops that contain medicines that reduce swelling (steroids). A procedure to drain the fluid in your eardrum. In this procedure, a small tube may be placed in the eardrum to: Drain the fluid. Restore the air in the middle ear space. A procedure to insert a balloon device through the nose  to inflate the opening of the eustachian tube (balloon dilation). Follow these instructions at home: Lifestyle Do not do any of the following until your health care provider approves: Travel to high altitudes. Fly in airplanes. Work in a Pension scheme manager or room. Scuba dive. Do  not use any products that contain nicotine or tobacco. These products include cigarettes, chewing tobacco, and vaping devices, such as e-cigarettes. If you need help quitting, ask your health care provider. Keep your ears dry. Wear fitted earplugs during showering and bathing. Dry your ears completely after. General instructions Take over-the-counter and prescription medicines only as told by your health care provider. Use techniques to help pop your ears as recommended by your health care provider. These may include: Chewing gum. Yawning. Frequent, forceful swallowing. Closing your mouth, holding your nose closed, and gently blowing as if you are trying to blow air out of your nose. Keep all follow-up visits. This is important. Contact a health care provider if: Your symptoms do not go away after treatment. Your symptoms come back after treatment. You are unable to pop your ears. You have: A fever. Pain in your ear. Pain in your head or neck. Fluid draining from your ear. Your hearing suddenly changes. You become very dizzy. You lose your balance. Get help right away if: You have a sudden, severe increase in any of your symptoms. Summary Eustachian tube dysfunction refers to a condition in which a blockage develops in the eustachian tube. It can be caused by ear infections, allergies, inhaled irritants, or abnormal growths in the nose or throat. Symptoms may include ear pain or fullness, hearing loss, or ringing in the ears. Mild cases are treated with techniques to unblock the ears, such as yawning or chewing gum. More severe cases are treated with medicines or procedures. This information is not intended to replace advice given to you by your health care provider. Make sure you discuss any questions you have with your health care provider. Document Revised: 02/22/2021 Document Reviewed: 02/22/2021 Elsevier Patient Education  [place].

## 2022-11-04 NOTE — Progress Notes (Signed)
Patient ID: Monica Hardin, female    DOB: Oct 13, 1966, 56 y.o.   MRN: [id number]  PCP: Steele Sizer, MD  Chief Complaint  Patient presents with   Ear Pain    Pt states right ear it aches every now and then but feels like she does not hear from it    Subjective:   Monica Hardin is a 56 y.o. female, presents to clinic with CC of the following:  HPI  Sinusitis and right AOM On augmentin x 5 d Sinuses feel better and right ear is not hurting as back but it still feels blocked, she wanted to be rechecked today She has stayed on an OTC zyrtec, is using atrovent nasal spray from the UC provider and taking augmentin. No fever Still a little congestions Decreased hearing   Patient Active Problem List   Diagnosis Date Noted   Varicose veins of both lower extremities 02/27/2019   Primary osteoarthritis of both hands 06/15/2018   Primary osteoarthritis of both knees 06/15/2018   Primary osteoarthritis of both feet 06/15/2018   Positive ANA (antinuclear antibody) 06/15/2018   Fibromyalgia 06/15/2018   Encounter for screening colonoscopy 11/07/2016   Surgical menopause on hormone replacement therapy 09/01/2016   Vitamin D deficiency 04/20/2016   Low vitamin B12 level 04/20/2016   History of uterine leiomyoma 04/19/2016   Sciatica, right side 04/19/2016   Status post TAH-BSO 11/12/2015   History of ovarian cyst 11/12/2015   Dyslipidemia 07/04/2015   Adult hypothyroidism 07/04/2015   Excess weight 07/04/2015   Disturbance of skin sensation 07/04/2015   Lipoma of arm 12/03/2014   CFIDS (chronic fatigue and immune dysfunction syndrome) ([place]) 05/08/2007      Current Outpatient Medications:    amoxicillin-clavulanate (AUGMENTIN) 875-125 MG tablet, Take 1 tablet by mouth every 12 (twelve) hours for 10 days., Disp: 20 tablet, Rfl: 0   DULoxetine (CYMBALTA) 60 MG capsule, Take 1 capsule (60 mg total) by mouth daily., Disp: 90 capsule, Rfl: 1   estradiol (ESTRACE) 0.5 MG  tablet, TAKE 1/2 TO 1 TABLET(0.25 TO 0.5 MG) BY MOUTH DAILY, Disp: 90 tablet, Rfl: 0   ipratropium (ATROVENT) 0.06 % nasal spray, Place 2 sprays into both nostrils 4 (four) times daily., Disp: 15 mL, Rfl: 12   levothyroxine (SYNTHROID) 125 MCG tablet, Take 125 mcg by mouth daily., Disp: , Rfl:    pregabalin (LYRICA) 75 MG capsule, Take 1 capsule (75 mg total) by mouth 3 (three) times daily., Disp: 270 capsule, Rfl: 1   traZODone (DESYREL) 50 MG tablet, Take 1 tablet (50 mg total) by mouth at bedtime., Disp: 90 tablet, Rfl: 1   Vitamin D, Ergocalciferol, (DRISDOL) 1.25 MG (50000 UNIT) CAPS capsule, Take 1 capsule (50,000 Units total) by mouth once a week., Disp: 12 capsule, Rfl: 1   No Known Allergies   Social History   Tobacco Use   Smoking status: Never   Smokeless tobacco: Never  Vaping Use   Vaping Use: Never used  Substance Use Topics   Alcohol use: No    Alcohol/week: 0.0 standard drinks of alcohol   Drug use: Never      Chart Review Today: I personally reviewed active problem list, medication list, allergies, family history, social history, health maintenance, notes from last encounter, lab results, imaging with the patient/caregiver today.    Review of Systems  Constitutional: Negative.   HENT: Negative.    Eyes: Negative.   Respiratory: Negative.    Cardiovascular: Negative.   Gastrointestinal: Negative.  Endocrine: Negative.   Genitourinary: Negative.   Musculoskeletal: Negative.   Skin: Negative.   Allergic/Immunologic: Negative.   Neurological: Negative.   Hematological: Negative.   Psychiatric/Behavioral: Negative.    All other systems reviewed and are negative.      Objective:   Vitals:   11/04/22 1355  BP: 118/74  Pulse: 98  Resp: 16  Temp: 97.7 F (36.5 C)  TempSrc: Oral  SpO2: 99%  Weight: 190 lb 1.6 oz (86.2 kg)  Height: '5\' 6"'$  (1.676 m)    Body mass index is 30.68 kg/m.  Physical Exam Vitals and nursing note reviewed.   Constitutional:      General: She is not in acute distress.    Appearance: Normal appearance. She is well-developed. She is not ill-appearing, toxic-appearing or diaphoretic.  HENT:     Head: Normocephalic and atraumatic.     Right Ear: Ear canal and external ear normal. Decreased hearing noted. No middle ear effusion. There is no impacted cerumen. Tympanic membrane is injected. Tympanic membrane is not perforated.     Left Ear: External ear normal. Decreased hearing noted. No tenderness. There is impacted cerumen.     Nose: Mucosal edema, congestion and rhinorrhea present. Rhinorrhea is clear.  Eyes:     General:        Right eye: No discharge.        Left eye: No discharge.     Conjunctiva/sclera: Conjunctivae normal.  Neck:     Trachea: No tracheal deviation.  Cardiovascular:     Rate and Rhythm: Normal rate and regular rhythm.  Pulmonary:     Effort: Pulmonary effort is normal. No respiratory distress.     Breath sounds: No stridor.  Musculoskeletal:        General: Normal range of motion.  Skin:    General: Skin is warm and dry.     Findings: No rash.  Neurological:     Mental Status: She is alert.     Motor: No abnormal muscle tone.     Coordination: Coordination normal.  Psychiatric:        Behavior: Behavior normal.      Results for orders placed or performed in visit on 09/19/22  Urine Culture   Specimen: Urine  Result Value Ref Range   MICRO NUMBER: [id number]    SPECIMEN QUALITY: Adequate    Sample Source URINE    STATUS: FINAL    Result:      Less than 10,000 CFU/mL of single Gram negative organism isolated. No further testing will be performed. If clinically indicated, recollection using a method to minimize contamination, with prompt transfer to Urine Culture Transport Tube, is recommended.  POCT Urinalysis Dipstick  Result Value Ref Range   Color, UA Yellow    Clarity, UA Clear    Glucose, UA Negative Negative   Bilirubin, UA Negative    Ketones, UA  Negative    Spec Grav, UA 1.020 1.010 - 1.025   Blood, UA Trace    pH, UA 5.0 5.0 - 8.0   Protein, UA Negative Negative   Urobilinogen, UA 0.2 0.2 or 1.0 E.U./dL   Nitrite, UA Negative    Leukocytes, UA Negative Negative   Appearance Yellow    Odor Normal   Cervicovaginal ancillary only  Result Value Ref Range   Bacterial Vaginitis (gardnerella) Negative    Candida Vaginitis Negative    Candida Glabrata Negative    Comment      Normal Reference Range Bacterial Vaginosis -  Negative   Comment Normal Reference Range Candida Species - Negative    Comment Normal Reference Range Candida Galbrata - Negative    Indication: Cerumen impaction of the ear(s)  Medical necessity statement: On physical examination, cerumen impairs clinically significant portions of the external auditory canal, and tympanic membrane. Noted obstructive, copious cerumen that cannot be removed without magnification and instrumentations requiring MD/APP skills/procedure  Consent: Discussed benefits and risks of procedure and verbal consent obtained  Procedure:  Cerumen Disimpaction  Patient was prepped for the procedure.  Utilized an otoscope to assess and take note of the ear canal, the tympanic membrane, and the presence, amount, and placement of the cerumen.  Gentle ear lavage with warm water and hydrogen peroxide performed on the left ear.      Post procedure examination: shows cerumen was completely removed. Patient tolerated procedure well.  There were no complications and following the disimpaction the tympanic membrane was  visible on the left , TM intact.   Auditory canals are inflamed.   Pt tolerated procedure, reported improvement of symptoms after removal of cerumen.   The patient is made aware that they may experience temporary vertigo, temporary hearing loss, and temporary discomfort.  If these symptom last for more than 24 hours to follow up in clinic.      Assessment & Plan:     ICD-10-CM    1. Non-recurrent acute suppurative otitis media of right ear without spontaneous rupture of tympanic membrane  H66.001    1/2 of TM is transparent the posterior aspect has a few spots but no effusion or purulence noted, mildly erythematous injected edges of TM noted    2. Impacted cerumen of left ear  H61.22     3. Decreased hearing of both ears  H91.93    R>L AOM is improving compared to UC documentation, still feels "blocked" explained there is likely remaining inflammation or ETD    4. Acute otalgia, right  H92.01 ciprofloxacin-dexamethasone (CIPRODEX) OTIC suspension     Suspect pts right ear sx are from inflammation and possible ETD -  She was encouraged to continue antibiotic and complete it, still take antihistamines, add flonase She wants to try ear drops - they may have some benefit with steroid component TM intact Some either debris, spots/scaring possibly to posterior half of TM, anterior portion translucent, landmarks and cone of light visible , no effusion, purulence or bulging noted Very mild injection/erythema to border of canal and TM  Right nare/sinus congestion, erythema and drainage  is worse than left side - could have more obstruction on right side No hx of adult AOM Explained she will likely have some hearing changes and pressure for another 2 weeks after finishing abx She can f/up with [place] or see ENT if not back to baseline after that  Instructioned writted and printed for pt along with hand out for ETD:  Your right ear drop looks better than what was documented in the urgent care note - but its not totally normal.  I recommend you return to the office here in 2 weeks or so to have it rechecked and we can check your hearing at that time also.  If there are still abnormal findings or hearing we would then refer you to ENT or audiology for further evaluation and treatment.  Ears often feel blocked due to eustachian tube dysfunction Which is treated with  antihistamines, intranasal steroid sprays and decongestants - which I recommend you start taking after you are done with  the antibiotics (can take the steroid nasal spray and antihistamine now with antibiotics and start sudafed from behind the counter of pharmacy after completed with augmentin)  Return for 2-3 week f/up on ear drum and hearing.    Delsa Grana, PA-C 11/04/22 2:14 PM

## 2022-11-04 NOTE — Patient Instructions (Signed)
It was nice to see you today.   Overall, your MRI looked good. You have some wear and tear (arthritis) in the joints at L4-L5 and L5-S1. This likely is causing your low back pain.   I sent a referral for you to see Dr. Sharlet Salina at the [facility] to discuss possible injections. They should call you to schedule or you can call his clinic at [phone].   Will leave your appointments open with me, but please call with any questions or concerns.   Geronimo Boot PA-C [phone]

## 2022-11-28 ENCOUNTER — Other Ambulatory Visit: Payer: Self-pay | Admitting: Family Medicine

## 2022-11-28 DIAGNOSIS — M797 Fibromyalgia: Secondary | ICD-10-CM

## 2022-12-06 NOTE — Progress Notes (Unsigned)
Name: Monica Hardin   MRN: [id number]    DOB: [birth year]   Date:12/07/2022       Progress Note  Subjective  Chief Complaint  Annual Exam  HPI  Patient presents for annual CPE and follow up  FMS: she saw Dr. Jefferson Fuel back in 05/2018 and was given formal diagnosis of FMS. She started on Duloxetine years ago we added Lyrica in January  2020 and she states the combination has been helping with symptoms. . She continues to have body aches intermittent but morning  stiffness is still daily but stable. Marland Kitchen She went to [facility] for a second opinion , she was seen by Dr. Cherlynn Kaiser on 03/30/2021 Anti-DFS 2 Ab was positive. Last CRP and sed rate negative done 09/22 , she likes getting labs done years    Polyarthralgia: seen by Dr. Kathi Ludwig back in 2019 and ANA was high and also inflammatory markers, we have been following levels, she had a second opinion at [facility] 03/2021 saw Dr. Cherlynn Kaiser , infammatory markers back to normal but she would like to keep checking it    Paresthesia: she continues to have intermittent episodes of tingling and numbness on left arm, sometimes has it on her legs, no bowel or bladder incontinence, she feels weak but that has been stable, she states Dr. Cherlynn Kaiser asked her about evaluation for MS, we did that back in 2012 and we repeated MRI c-spine and MRI and negative for MS, she had stenosis C4-C5. She states she still has symptoms but not constant    Hypothyroidism: she has been on levothyroxine for years, TPO positive and she was seen by Dr. Gabriel Carina who is prescribing the levothyroxine , she is compliant with therapy. Last TSH normal, she is worried about weight gain, discussed intermittent fasting    Pre-diabetes: last hgbA1C was 5.4% with change of life style, her weight has been stable. She eats a low sugar diet . Unchanged    Dyslipidemia; not on medication, on life style modification only. She has very high LDL at 170, she states not a high family history risk of heart attacks  and strokes she has FMS we gave her statin therapy but she could not tolerate it     Chronic fatigue: she is able to work a full time job, currently working from home and has helped her , she has been walking or going to the gym 3 times per week.  She states she still goes to bed early - between 9-9:30 and wakes up between 5:30 -6 am She states Trazodone is helping with sleep, could not tolerate Provigil it caused her to feel jittery    Neurogenic claudication : she developed low back pain over the past since Spring 2023, finally seen here and had abnormal X-ray lumbar spine, referred neurosurgeon and MRI showed findings below , she was referred PMR and had steroid injections last week and radiculitis down right leg resolved   IMPRESSIO 10/29/2022 : 1. Mild bilateral facet arthropathy at L4-5 and L5-S1. 2. No significant lumbar spine disc protrusion, foraminal stenosis or central canal stenosis. 3. No acute osseous injury of the lumbar spine.      Diet:small portions, likely not enough calories Exercise: discussed 150 minutes per week   Last Eye Exam: up to date  Last Dental Exam: up to date  [employer] Visit from 09/07/2021 in [facility]  AUDIT-C Score 0      Depression: Phq 9 is  negative    12/07/2022  1:10 PM 11/04/2022    1:55 PM 09/19/2022    2:20 PM 03/07/2022    7:55 AM 09/07/2021    7:53 AM  Depression screen PHQ 2/9  Decreased Interest 0 0 0 0 0  Down, Depressed, Hopeless 0 0 0 0 0  PHQ - 2 Score 0 0 0 0 0  Altered sleeping 0 0 0 0   Tired, decreased energy 0 0 0 0   Change in appetite 0 0 0 0   Feeling bad or failure about yourself  0 0 0 0   Trouble concentrating 0 0 0 0   Moving slowly or fidgety/restless 0 0 0 0   Suicidal thoughts 0 0 0 0   PHQ-9 Score 0 0 0 0   Difficult doing work/chores  Not difficult at all Not difficult at all     Hypertension: BP Readings from Last 3 Encounters:  12/07/22 116/78  11/04/22 118/74   11/04/22 126/78   Obesity: Wt Readings from Last 3 Encounters:  12/07/22 193 lb 14.4 oz (88 kg)  11/04/22 190 lb 1.6 oz (86.2 kg)  11/04/22 190 lb (86.2 kg)   BMI Readings from Last 3 Encounters:  12/07/22 31.30 kg/m  11/04/22 30.68 kg/m  11/04/22 30.67 kg/m     Vaccines:   Tdap: up to date Shingrix: up to date Pneumonia: N/A Flu: today  COVID-19: up to date   Hep C Screening: 09/18/20 STD testing and prevention (HIV/chl/gon/syphilis): 03/27/17 Intimate partner violence: negative screen  Sexual History : no pain  Menstrual History/LMP/Abnormal Bleeding: hysterectomy  Discussed importance of follow up if any post-menopausal bleeding: N/A Incontinence Symptoms: negative for symptoms   Breast cancer:  - Last Mammogram: 08/30/22 - BRCA gene screening: N/A  Osteoporosis Prevention : Discussed high calcium and vitamin D supplementation, weight bearing exercises Bone density: discussed checking since she has hypothyroidism   Cervical cancer screening: N/A  Skin cancer: Discussed monitoring for atypical lesions  Colorectal cancer: 11/22/16   Lung cancer:  Low Dose CT Chest recommended if Age 63-80 years, 20 pack-year currently smoking OR have quit w/in 15years. Patient does not qualify for screen   ECG: 05/01/18  Advanced Care Planning: A voluntary discussion about advance care planning including the explanation and discussion of advance directives.  Discussed health care proxy and Living will, and the patient was able to identify a health care proxy as husband .  Patient does not have a living will and power of attorney of health care   Lipids: Lab Results  Component Value Date   CHOL 247 (H) 09/07/2021   CHOL 256 (H) 09/18/2020   CHOL 226 (H) 11/08/2019   Lab Results  Component Value Date   HDL 56 09/07/2021   HDL 49 09/18/2020   HDL 59 11/08/2019   Lab Results  Component Value Date   LDLCALC 170 (H) 09/07/2021   LDLCALC 170 (H) 09/18/2020   LDLCALC 148 (H)  11/08/2019   Lab Results  Component Value Date   TRIG 119 09/07/2021   TRIG 198 (H) 09/18/2020   TRIG 109 11/08/2019   Lab Results  Component Value Date   CHOLHDL 4.4 09/07/2021   CHOLHDL 5.2 (H) 09/18/2020   CHOLHDL 3.8 11/08/2019   No results found for: "LDLDIRECT"  Glucose: Glucose  Date Value Ref Range Status  09/07/2021 86 65 - 99 mg/dL Final  09/18/2020 98 65 - 99 mg/dL Final  11/08/2019 95 65 - 99 mg/dL Final   Glucose, Bld  Date Value Ref  Range Status  03/27/2017 114 (H) 65 - 99 mg/dL Final  03/26/2017 123 (H) 65 - 99 mg/dL Final    Patient Active Problem List   Diagnosis Date Noted   Neurogenic claudication due to lumbar spinal stenosis 12/07/2022   Varicose veins of both lower extremities 02/27/2019   Primary osteoarthritis of both hands 06/15/2018   Primary osteoarthritis of both knees 06/15/2018   Primary osteoarthritis of both feet 06/15/2018   Positive ANA (antinuclear antibody) 06/15/2018   Fibromyalgia 06/15/2018   Encounter for screening colonoscopy 11/07/2016   Surgical menopause on hormone replacement therapy 09/01/2016   Vitamin D deficiency 04/20/2016   Low vitamin B12 level 04/20/2016   History of uterine leiomyoma 04/19/2016   Sciatica, right side 04/19/2016   Status post TAH-BSO 11/12/2015   History of ovarian cyst 11/12/2015   Dyslipidemia 07/04/2015   Adult hypothyroidism 07/04/2015   Excess weight 07/04/2015   Disturbance of skin sensation 07/04/2015   Lipoma of arm 12/03/2014   CFIDS (chronic fatigue and immune dysfunction syndrome) ([place]) 05/08/2007    Past Surgical History:  Procedure Laterality Date   ABDOMINAL HYSTERECTOMY N/A 10/05/2015   Procedure: HYSTERECTOMY ABDOMINAL;  Surgeon: [name], MD;  Location: [facility] ORS;  Service: Gynecology;  Laterality: N/A;   CHOLECYSTECTOMY N/A 03/27/2017   Procedure: LAPAROSCOPIC CHOLECYSTECTOMY;  Surgeon: Olean Ree, MD;  Location: [facility] ORS;  Service: General;  Laterality: N/A;    COLONOSCOPY WITH PROPOFOL N/A 11/22/2016   Procedure: COLONOSCOPY WITH PROPOFOL;  Surgeon: Robert Bellow, MD;  Location: [facility] END[facility]COPY;  Service: Endoscopy;  Laterality: N/A;   FRACTURE SURGERY     LAPAROSCOPY N/A 08/24/2015   Procedure: LAPAROSCOPY DIAGNOSTIC;  Surgeon: [name], MD;  Location: [facility] ORS;  Service: Gynecology;  Laterality: N/A;   LEEP  [id number]   [facility]   MANDIBLE FRACTURE SURGERY  1996   SALPINGOOPHORECTOMY Bilateral 10/05/2015   Procedure: SALPINGO OOPHORECTOMY;  Surgeon: [name], MD;  Location: [facility] OR[facility];  Service: Gynecology;  Laterality: Bilateral;    Family History  Problem Relation Age of Onset   Lung cancer Mother    Cancer Mother    Lung cancer Father    Cancer Father    Kidney Stones Daughter    Asthma Son    Breast cancer Paternal Aunt 60   Diabetes Paternal Grandmother    Thyroid disease Sister     Social History   Socioeconomic History   Marital status: Married    Spouse name: Doug   Number of children: 2   Years of education: Not on file   Highest education level: Associate degree: academic program  Occupational History   Occupation: Office manager: [employer]  Tobacco Use   Smoking status: Never   Smokeless tobacco: Never  Vaping Use   Vaping Use: Never used  Substance and Sexual Activity   Alcohol use: No    Alcohol/week: 0.0 standard drinks of alcohol   Drug use: Never   Sexual activity: Yes    Partners: Male    Birth control/protection: None    Comment: Hysterectomy  Other Topics Concern   Not on file  Social History Narrative   Two grown children, married   Works at [employer] Strain: [place]  (12/07/2022)   Overall Financial Resource Strain (CARDIA)    Difficulty of Paying Living Expenses: Not hard at all  Food Insecurity: No Food Insecurity (12/07/2022)   Hunger Vital  Sign    Worried About Charity fundraiser in the Last Year: Never  true    [place] in the Last Year: Never true  Transportation Needs: No Transportation Needs (12/07/2022)   PRAPARE - Hydrologist (Medical): No    Lack of Transportation (Non-Medical): No  Physical Activity: Insufficiently Active (12/07/2022)   Exercise Vital Sign    Days of Exercise per Week: 3 days    Minutes of Exercise per Session: 30 min  Stress: No Stress Concern Present (12/07/2022)   [place]    Feeling of Stress : Only a little  Social Connections: Socially Integrated (12/07/2022)   Social Connection and Isolation Panel [NHANES]    Frequency of Communication with Friends and Family: More than three times a week    Frequency of Social Gatherings with Friends and Family: Three times a week    Attends Religious Services: More than 4 times per year    Active Member of Clubs or Organizations: Yes    Attends Archivist Meetings: More than 4 times per year    Marital Status: Married  Human resources officer Violence: Not At Risk (12/07/2022)   Humiliation, Afraid, Rape, and Kick questionnaire    Fear of Current or Ex-Partner: No    Emotionally Abused: No    Physically Abused: No    Sexually Abused: No     Current Outpatient Medications:    DULoxetine (CYMBALTA) 60 MG capsule, Take 1 capsule (60 mg total) by mouth daily., Disp: 90 capsule, Rfl: 1   estradiol (ESTRACE) 0.5 MG tablet, TAKE 1/2 TO 1 TABLET(0.25 TO 0.5 MG) BY MOUTH DAILY, Disp: 90 tablet, Rfl: 0   ipratropium (ATROVENT) 0.06 % nasal spray, Place 2 sprays into both nostrils 4 (four) times daily., Disp: 15 mL, Rfl: 12   levothyroxine (SYNTHROID) 125 MCG tablet, Take 125 mcg by mouth daily., Disp: , Rfl:    pregabalin (LYRICA) 75 MG capsule, TAKE 1 CAPSULE(75 MG) BY MOUTH THREE TIMES DAILY, Disp: 90 capsule, Rfl: 0   traZODone (DESYREL) 50 MG tablet, Take 1 tablet (50 mg total) by mouth at bedtime., Disp: 90  tablet, Rfl: 1   Vitamin D, Ergocalciferol, (DRISDOL) 1.25 MG (50000 UNIT) CAPS capsule, Take 1 capsule (50,000 Units total) by mouth once a week., Disp: 12 capsule, Rfl: 1  No Known Allergies   ROS  Constitutional: Negative for fever or weight change.  Respiratory: Negative for cough and shortness of breath.   Cardiovascular: Negative for chest pain or palpitations.  Gastrointestinal: Negative for abdominal pain, no bowel changes.  Musculoskeletal: Negative for gait problem or joint swelling.  Skin: Negative for rash.  Neurological: Negative for dizziness or headache.  No other specific complaints in a complete review of systems (except as listed in HPI above).   Objective  Vitals:   12/07/22 1309  BP: 116/78  Pulse: 93  Resp: 16  Temp: 98.3 F (36.8 C)  TempSrc: Oral  SpO2: 98%  Weight: 193 lb 14.4 oz (88 kg)  Height: _0  (1.676 m)    Body mass index is 31.3 kg/m.  Physical Exam  Constitutional: Patient appears well-developed and well-nourished. No distress.  HENT: Head: Normocephalic and atraumatic. Ears: B TMs ok, no erythema or effusion; Nose: Nose normal. Mouth/Throat: Oropharynx is clear and moist. No oropharyngeal exudate.  Eyes: Conjunctivae and EOM are normal. Pupils are equal, round, and reactive to light. No scleral icterus.  Neck: Normal range of motion. Neck supple. No JVD present. No thyromegaly present.  Cardiovascular: Normal rate, regular rhythm and normal heart sounds.  No murmur heard. No BLE edema. Pulmonary/Chest: Effort normal and breath sounds normal. No respiratory distress. Abdominal: Soft. Bowel sounds are normal, no distension. There is no tenderness. no masses Breast: no lumps or masses, no nipple discharge or rashes FEMALE GENITALIA:  Not done  RECTAL:not done  Musculoskeletal: Normal range of motion, no joint effusions. No gross deformities Neurological: he is alert and oriented to person, place, and time. No cranial nerve deficit.  Coordination, balance, strength, speech and gait are normal.  Skin: Skin is warm and dry. No rash noted. No erythema.  Psychiatric: Patient has a normal mood and affect. behavior is normal. Judgment and thought content normal.   Recent Results (from the past 2160 hour(s))  POCT Urinalysis Dipstick     Status: None   Collection Time: 09/19/22  2:31 PM  Result Value Ref Range   Color, UA Yellow    Clarity, UA Clear    Glucose, UA Negative Negative   Bilirubin, UA Negative    Ketones, UA Negative    Spec Grav, UA 1.020 1.010 - 1.025   Blood, UA Trace    pH, UA 5.0 5.0 - 8.0   Protein, UA Negative Negative   Urobilinogen, UA 0.2 0.2 or 1.0 E.U./dL   Nitrite, UA Negative    Leukocytes, UA Negative Negative   Appearance Yellow    Odor Normal   Cervicovaginal ancillary only     Status: None   Collection Time: 09/19/22  2:59 PM  Result Value Ref Range   Bacterial Vaginitis (gardnerella) Negative    Candida Vaginitis Negative    Candida Glabrata Negative    Comment      Normal Reference Range Bacterial Vaginosis - Negative   Comment Normal Reference Range Candida Species - Negative    Comment Normal Reference Range Candida Galbrata - Negative   Urine Culture     Status: None   Collection Time: 09/19/22  3:09 PM   Specimen: Urine  Result Value Ref Range   MICRO NUMBER: [id number]    SPECIMEN QUALITY: Adequate    Sample Source URINE    STATUS: FINAL    Result:      Less than 10,000 CFU/mL of single Gram negative organism isolated. No further testing will be performed. If clinically indicated, recollection using a method to minimize contamination, with prompt transfer to Urine Culture Transport Tube, is recommended.     Fall Risk:    12/07/2022    1:12 PM 11/04/2022    1:55 PM 09/19/2022    2:20 PM 03/07/2022    7:54 AM 09/07/2021    7:53 AM  Fall Risk   Falls in the past year? 0 0 0 0 0  Number falls in past yr:  0 0 0 0  Injury with Fall?  0 0 0 0  Risk for fall due to : No  Fall Risks No Fall Risks No Fall Risks No Fall Risks No Fall Risks  Follow up Falls prevention discussed;Education provided;Falls evaluation completed Falls prevention discussed;Education provided;Falls evaluation completed Falls prevention discussed;Education provided Falls prevention discussed Falls prevention discussed     Functional Status Survey: Is the patient deaf or have difficulty hearing?: No Does the patient have difficulty seeing, even when wearing glasses/contacts?: No Does the patient have difficulty concentrating, remembering, or making decisions?: No Does the patient have difficulty walking or climbing stairs?:  No Does the patient have difficulty dressing or bathing?: No Does the patient have difficulty doing errands alone such as visiting a doctor's office or shopping?: No   Assessment & Plan  1. Well adult exam  - COMPLETE METABOLIC PANEL WITH GFR - B12 and Folate Panel - C-reactive protein - CBC with Differential/Platelet - Hemoglobin A1c - Lipid panel - Sedimentation rate - TSH - VITAMIN D 25 Hydroxy (Vit-D Deficiency, Fractures)  2. Neurogenic claudication due to lumbar spinal stenosis  Doing better since steroid injection  3. CFIDS (chronic fatigue and immune dysfunction syndrome) ([facility])  Stable  4. Vitamin D deficiency  - VITAMIN D 25 Hydroxy (Vit-D Deficiency, Fractures)  5. Dyslipidemia   6. Low vitamin B12 level  - B12 and Folate Panel  7. Long-term use of high-risk medication  - COMPLETE METABOLIC PANEL WITH GFR - Comprehensive metabolic panel - CBC with Differential/Platelet  8. Elevated C-reactive protein  - C-reactive protein - Sedimentation rate  9. Prediabetes  Check A1C   10. Hashimoto's thyroiditis  - TSH  11. Hypothyroidism (acquired)  - TSH   -USPSTF grade A and B recommendations reviewed with patient; age-appropriate recommendations, preventive care, screening tests, etc discussed and encouraged; healthy living  encouraged; see AVS for patient education given to patient -Discussed importance of 150 minutes of physical activity weekly, eat two servings of fish weekly, eat one serving of tree nuts ( cashews, pistachios, pecans, almonds.Marland Kitchen) every other day, eat 6 servings of fruit/vegetables daily and drink plenty of water and avoid sweet beverages.   -Reviewed Health Maintenance: Yes.

## 2022-12-06 NOTE — Patient Instructions (Signed)
Preventive Care 40-57 Years Old, Female Preventive care refers to lifestyle choices and visits with your health care provider that can promote health and wellness. Preventive care visits are also called wellness exams. What can I expect for my preventive care visit? Counseling Your health care provider may ask you questions about your: Medical history, including: Past medical problems. Family medical history. Pregnancy history. Current health, including: Menstrual cycle. Method of birth control. Emotional well-being. Home life and relationship well-being. Sexual activity and sexual health. Lifestyle, including: Alcohol, nicotine or tobacco, and drug use. Access to firearms. Diet, exercise, and sleep habits. Work and work environment. Sunscreen use. Safety issues such as seatbelt and bike helmet use. Physical exam Your health care provider will check your: Height and weight. These may be used to calculate your BMI (body mass index). BMI is a measurement that tells if you are at a healthy weight. Waist circumference. This measures the distance around your waistline. This measurement also tells if you are at a healthy weight and may help predict your risk of certain diseases, such as type 2 diabetes and high blood pressure. Heart rate and blood pressure. Body temperature. Skin for abnormal spots. What immunizations do I need?  Vaccines are usually given at various ages, according to a schedule. Your health care provider will recommend vaccines for you based on your age, medical history, and lifestyle or other factors, such as travel or where you work. What tests do I need? Screening Your health care provider may recommend screening tests for certain conditions. This may include: Lipid and cholesterol levels. Diabetes screening. This is done by checking your blood sugar (glucose) after you have not eaten for a while (fasting). Pelvic exam and Pap test. Hepatitis B test. Hepatitis C  test. HIV (human immunodeficiency virus) test. STI (sexually transmitted infection) testing, if you are at risk. Lung cancer screening. Colorectal cancer screening. Mammogram. Talk with your health care provider about when you should start having regular mammograms. This may depend on whether you have a family history of breast cancer. BRCA-related cancer screening. This may be done if you have a family history of breast, ovarian, tubal, or peritoneal cancers. Bone density scan. This is done to screen for osteoporosis. Talk with your health care provider about your test results, treatment options, and if necessary, the need for more tests. Follow these instructions at home: Eating and drinking  Eat a diet that includes fresh fruits and vegetables, whole grains, lean protein, and low-fat dairy products. Take vitamin and mineral supplements as recommended by your health care provider. Do not drink alcohol if: Your health care provider tells you not to drink. You are pregnant, may be pregnant, or are planning to become pregnant. If you drink alcohol: Limit how much you have to 0-1 drink a day. Know how much alcohol is in your drink. In the [place] one drink equals one 12 oz bottle of beer (355 mL), one 5 oz glass of wine (148 mL), or one 1 oz glass of hard liquor (44 mL). Lifestyle Brush your teeth every morning and night with fluoride toothpaste. Floss one time each day. Exercise for at least 30 minutes 5 or more days each week. Do not use any products that contain nicotine or tobacco. These products include cigarettes, chewing tobacco, and vaping devices, such as e-cigarettes. If you need help quitting, ask your health care provider. Do not use drugs. If you are sexually active, practice safe sex. Use a condom or other form of protection to   prevent STIs. If you do not wish to become pregnant, use a form of birth control. If you plan to become pregnant, see your health care provider for a  prepregnancy visit. Take aspirin only as told by your health care provider. Make sure that you understand how much to take and what form to take. Work with your health care provider to find out whether it is safe and beneficial for you to take aspirin daily. Find healthy ways to manage stress, such as: Meditation, yoga, or listening to music. Journaling. Talking to a trusted person. Spending time with friends and family. Minimize exposure to UV radiation to reduce your risk of skin cancer. Safety Always wear your seat belt while driving or riding in a vehicle. Do not drive: If you have been drinking alcohol. Do not ride with someone who has been drinking. When you are tired or distracted. While texting. If you have been using any mind-altering substances or drugs. Wear a helmet and other protective equipment during sports activities. If you have firearms in your house, make sure you follow all gun safety procedures. Seek help if you have been physically or sexually abused. What's next? Visit your health care provider once a year for an annual wellness visit. Ask your health care provider how often you should have your eyes and teeth checked. Stay up to date on all vaccines. This information is not intended to replace advice given to you by your health care provider. Make sure you discuss any questions you have with your health care provider. Document Revised: 06/09/2021 Document Reviewed: 06/09/2021 Elsevier Patient Education  [place].

## 2022-12-07 ENCOUNTER — Ambulatory Visit (INDEPENDENT_AMBULATORY_CARE_PROVIDER_SITE_OTHER): Payer: Managed Care, Other (non HMO) | Admitting: Family Medicine

## 2022-12-07 ENCOUNTER — Encounter: Payer: Self-pay | Admitting: Family Medicine

## 2022-12-07 VITALS — BP 116/78 | HR 93 | Temp 98.3°F | Resp 16 | Ht 66.0 in | Wt 193.9 lb

## 2022-12-07 DIAGNOSIS — M48062 Spinal stenosis, lumbar region with neurogenic claudication: Secondary | ICD-10-CM | POA: Diagnosis not present

## 2022-12-07 DIAGNOSIS — R7303 Prediabetes: Secondary | ICD-10-CM

## 2022-12-07 DIAGNOSIS — E559 Vitamin D deficiency, unspecified: Secondary | ICD-10-CM

## 2022-12-07 DIAGNOSIS — E063 Autoimmune thyroiditis: Secondary | ICD-10-CM

## 2022-12-07 DIAGNOSIS — Z0000 Encounter for general adult medical examination without abnormal findings: Secondary | ICD-10-CM | POA: Diagnosis not present

## 2022-12-07 DIAGNOSIS — R7982 Elevated C-reactive protein (CRP): Secondary | ICD-10-CM

## 2022-12-07 DIAGNOSIS — G4700 Insomnia, unspecified: Secondary | ICD-10-CM

## 2022-12-07 DIAGNOSIS — Z79899 Other long term (current) drug therapy: Secondary | ICD-10-CM

## 2022-12-07 DIAGNOSIS — Z23 Encounter for immunization: Secondary | ICD-10-CM

## 2022-12-07 DIAGNOSIS — M797 Fibromyalgia: Secondary | ICD-10-CM

## 2022-12-07 DIAGNOSIS — G9332 Myalgic encephalomyelitis/chronic fatigue syndrome: Secondary | ICD-10-CM

## 2022-12-07 DIAGNOSIS — E785 Hyperlipidemia, unspecified: Secondary | ICD-10-CM | POA: Diagnosis not present

## 2022-12-07 DIAGNOSIS — E039 Hypothyroidism, unspecified: Secondary | ICD-10-CM

## 2022-12-07 DIAGNOSIS — R7989 Other specified abnormal findings of blood chemistry: Secondary | ICD-10-CM

## 2022-12-07 DIAGNOSIS — D8989 Other specified disorders involving the immune mechanism, not elsewhere classified: Secondary | ICD-10-CM

## 2022-12-07 HISTORY — DX: Spinal stenosis, lumbar region with neurogenic claudication: M48.062

## 2022-12-07 MED ORDER — PREGABALIN 75 MG PO CAPS: 75.0000 mg | ORAL_CAPSULE | Freq: Two times a day (BID) | ORAL | 1 refills | Status: DC

## 2022-12-07 MED ORDER — DULOXETINE HCL 60 MG PO CPEP: 60.0000 mg | ORAL_CAPSULE | Freq: Every day | ORAL | 1 refills | Status: DC

## 2022-12-07 MED ORDER — VITAMIN D (ERGOCALCIFEROL) 1.25 MG (50000 UNIT) PO CAPS: 50000.0000 [IU] | ORAL_CAPSULE | ORAL | 1 refills | Status: DC

## 2022-12-07 MED ORDER — TRAZODONE HCL 50 MG PO TABS: 50.0000 mg | ORAL_TABLET | Freq: Every day | ORAL | 1 refills | Status: DC

## 2022-12-09 ENCOUNTER — Encounter: Payer: Self-pay | Admitting: Family Medicine

## 2022-12-09 LAB — CBC WITH DIFFERENTIAL/PLATELET
Basophils Absolute: 0 10*3/uL (ref 0.0–0.2)
Basos: 1 %
EOS (ABSOLUTE): 0.1 10*3/uL (ref 0.0–0.4)
Eos: 2 %
Hematocrit: 41.2 % (ref 34.0–46.6)
Hemoglobin: 13.5 g/dL (ref 11.1–15.9)
Immature Grans (Abs): 0 10*3/uL (ref 0.0–0.1)
Immature Granulocytes: 0 %
Lymphocytes Absolute: 1.8 10*3/uL (ref 0.7–3.1)
Lymphs: 28 %
MCH: 30.8 pg (ref 26.6–33.0)
MCHC: 32.8 g/dL (ref 31.5–35.7)
MCV: 94 fL (ref 79–97)
Monocytes Absolute: 0.5 10*3/uL (ref 0.1–0.9)
Monocytes: 8 %
Neutrophils Absolute: 4 10*3/uL (ref 1.4–7.0)
Neutrophils: 61 %
Platelets: 322 10*3/uL (ref 150–450)
RBC: 4.38 x10E6/uL (ref 3.77–5.28)
RDW: 12.3 % (ref 11.7–15.4)
WBC: 6.4 10*3/uL (ref 3.4–10.8)

## 2022-12-09 LAB — COMPREHENSIVE METABOLIC PANEL
ALT: 26 IU/L (ref 0–32)
AST: 21 IU/L (ref 0–40)
Albumin/Globulin Ratio: 1.8 (ref 1.2–2.2)
Albumin: 4.4 g/dL (ref 3.8–4.9)
Alkaline Phosphatase: 85 IU/L (ref 44–121)
BUN/Creatinine Ratio: 17 (ref 9–23)
BUN: 17 mg/dL (ref 6–24)
Bilirubin Total: 0.5 mg/dL (ref 0.0–1.2)
CO2: 21 mmol/L (ref 20–29)
Calcium: 9.7 mg/dL (ref 8.7–10.2)
Chloride: 103 mmol/L (ref 96–106)
Creatinine, Ser: 0.98 mg/dL (ref 0.57–1.00)
Globulin, Total: 2.5 g/dL (ref 1.5–4.5)
Glucose: 94 mg/dL (ref 70–99)
Potassium: 4.6 mmol/L (ref 3.5–5.2)
Sodium: 142 mmol/L (ref 134–144)
Total Protein: 6.9 g/dL (ref 6.0–8.5)
eGFR: 67 mL/min/{1.73_m2} (ref >59)

## 2022-12-09 LAB — B12 AND FOLATE PANEL
Folate: 4.8 ng/mL (ref >3.0)
Vitamin B-12: 473 pg/mL (ref 232–1245)

## 2022-12-09 LAB — SEDIMENTATION RATE: Sed Rate: 28 mm/hr (ref 0–40)

## 2022-12-09 LAB — TSH: TSH: 0.919 u[IU]/mL (ref 0.450–4.500)

## 2022-12-09 LAB — LIPID PANEL
Chol/HDL Ratio: 3.9 ratio (ref 0.0–4.4)
Cholesterol, Total: 212 mg/dL — ABNORMAL HIGH (ref 100–199)
HDL: 54 mg/dL (ref >39)
LDL Chol Calc (NIH): 132 mg/dL — ABNORMAL HIGH (ref 0–99)
Triglycerides: 146 mg/dL (ref 0–149)
VLDL Cholesterol Cal: 26 mg/dL (ref 5–40)

## 2022-12-09 LAB — HEMOGLOBIN A1C
Est. average glucose Bld gHb Est-mCnc: 123 mg/dL
Hgb A1c MFr Bld: 5.9 % — ABNORMAL HIGH (ref 4.8–5.6)

## 2022-12-09 LAB — C-REACTIVE PROTEIN: CRP: 9 mg/L (ref 0–10)

## 2022-12-09 LAB — VITAMIN D 25 HYDROXY (VIT D DEFICIENCY, FRACTURES): Vit D, 25-Hydroxy: 33.4 ng/mL (ref 30.0–100.0)

## 2023-01-23 ENCOUNTER — Encounter: Payer: Self-pay | Admitting: Family Medicine

## 2023-01-23 ENCOUNTER — Telehealth (INDEPENDENT_AMBULATORY_CARE_PROVIDER_SITE_OTHER): Payer: Managed Care, Other (non HMO) | Admitting: Physician Assistant

## 2023-01-23 DIAGNOSIS — J0100 Acute maxillary sinusitis, unspecified: Secondary | ICD-10-CM

## 2023-01-23 MED ORDER — DOXYCYCLINE HYCLATE 100 MG PO TABS: 100.0000 mg | ORAL_TABLET | Freq: Two times a day (BID) | ORAL | 0 refills | Status: AC

## 2023-01-23 NOTE — Progress Notes (Signed)
Virtual Visit via Video Note  I connected with Monica Hardin on 01/23/23 at 11:00 AM EST by a video enabled telemedicine application and verified that I am speaking with the correct person using two identifiers. Today's Provider: Talitha Givens, MHS, PA-C Introduced myself to the patient as a PA-C and provided education on APPs in clinical practice.    Location: Patient: at home  Provider: [place], [place]    I discussed the limitations of evaluation and management by telemedicine and the availability of in person appointments. The patient expressed understanding and agreed to proceed.   Chief Complaint  Patient presents with   Sinusitis    Facial pressure, watery eyes, green mucus and all started on Saturday    History of Present Illness:    URI-Type Symptoms  Onset: sudden  Duration: Late Friday night/ Saturday morning  Associated symptoms: stuffy nose, greenish-yellow drainage from eyes, rhinorrhea, scratchy throat, coughing  She reports her eyes were matted shut this AM when she woke up  She reports similar eye symptoms in the past with previous sinus infections  She denies fever, chills at this time  Interventions: has not been taking OTC medications   Recent Sick contacts: She denies recent sick contacts  COVID testing at home: tested at home yesterday afternoon   Results: Negative     Review of Systems  Constitutional:  Negative for chills and fever.  HENT:  Positive for congestion, sinus pain and sore throat. Negative for ear pain.   Eyes:  Positive for discharge.  Respiratory:  Positive for cough. Negative for sputum production, shortness of breath and wheezing.   Gastrointestinal:  Negative for diarrhea, nausea and vomiting.  Neurological:  Negative for headaches.    Observations/Objective:   Due to the nature of the virtual visit, physical exam and observations are limited. Able to obtain the following  observations:   Alert, oriented, Appears comfortable, in no acute distress.   no appreciated hoarseness, tachypnea, wheeze or strider. Able to maintain conversation without visible strain.  No cough appreciated during visit.  Eyes appear swollen and ocular orbit appears swollen as well    Assessment and Plan:  Problem List Items Addressed This Visit   None Visit Diagnoses     Acute maxillary sinusitis, recurrence not specified    -  Primary Acute, new concern Patient reports sinus pain, pressure, ocular orbit swelling, congestion, and mild coughing since Sat Limited PE reveals an ill-appearing patient with ocular and eyelid swelling during exam Will begin Doxycycline 100 mg po BID x 7 days to assist with symptoms  Reviewed using OTC multi-symptom medications along with an antihistamine to assist with further symptom relief Reviewed return precautions Follow up as needed for persistent or progressing symptoms    Relevant Medications   doxycycline (VIBRA-TABS) 100 MG tablet      Follow Up Instructions:    I discussed the assessment and treatment plan with the patient. The patient was provided an opportunity to ask questions and all were answered. The patient agreed with the plan and demonstrated an understanding of the instructions.   The patient was advised to call back or seek an in-person evaluation if the symptoms worsen or if the condition fails to improve as anticipated.  I provided 10 minutes of non-face-to-face time during this encounter.  No follow-ups on file.   I, Abisai Coble E Joelly Bolanos, PA-C, have reviewed all documentation for this visit. The documentation on 01/23/23 for the exam, diagnosis, procedures, and orders  are all accurate and complete.   Talitha Givens, MHS, PA-C [facility]

## 2023-01-23 NOTE — Patient Instructions (Addendum)
  Based on your symptoms and duration of illness, I believe you may have a bacterial sinus infection  These typically resolve with antibiotic therapy along with at-home comfort measures  Today I have sent in a prescription for Doxycyxline 100 mg to be taken by mouth twice per day for 7 days   FINISH THE ENTIRE COURSE unless you develop an allergic reaction or are instructed to discontinue.  It can take a few days for the antibiotic to kick in so I recommend symptomatic relief with over the counter medication such as the following: Dayquil/ Nyquil Theraflu Alkaseltzer  Coricidin - if you have high blood pressure even if it is well managed with medications  I recommend starting an antihistamine such as Allegra or Zyrtec to assist with your sinuses as well.   These medications typically have Tylenol in them already so you can take Ibuprofen as needed for further pain/ discomfort and fever management/ do not need to supplement with more outside of those medications  Stay well hydrated with at least 75 oz of water per day to help with recovery  If you notice any of the following please let us know: increased fever not responding to Tylenol or Ibuprofen, swelling around your nose or eyes, difficulty seeing,

## 2023-02-27 ENCOUNTER — Encounter: Payer: Self-pay | Admitting: Family Medicine

## 2023-02-27 ENCOUNTER — Other Ambulatory Visit: Payer: Self-pay | Admitting: Family Medicine

## 2023-02-27 MED ORDER — MAGNESIUM 30 MG PO TABS: 30.0000 mg | ORAL_TABLET | Freq: Every day | ORAL | 1 refills | Status: DC

## 2023-02-27 MED ORDER — ZINC GLUCONATE 50 MG PO TABS: 50.0000 mg | ORAL_TABLET | Freq: Every day | ORAL | 1 refills | Status: DC

## 2023-02-27 MED ORDER — B-12 1000 MCG SL SUBL: 1.0000 | SUBLINGUAL_TABLET | Freq: Every day | SUBLINGUAL | 1 refills | Status: DC

## 2023-02-28 ENCOUNTER — Other Ambulatory Visit: Payer: Self-pay | Admitting: Family Medicine

## 2023-02-28 MED ORDER — MAGNESIUM GLYCINATE 100 MG PO CAPS: 1.0000 | ORAL_CAPSULE | Freq: Every day | ORAL | 1 refills | Status: AC

## 2023-02-28 MED ORDER — MAGNESIUM 30 MG PO TABS: 30.0000 mg | ORAL_TABLET | Freq: Every day | ORAL | 1 refills | Status: DC

## 2023-04-24 ENCOUNTER — Encounter: Payer: Self-pay | Admitting: Family Medicine

## 2023-05-03 ENCOUNTER — Ambulatory Visit
Admission: RE | Admit: 2023-05-03 | Discharge: 2023-05-03 | Disposition: A | Discharge status: Home / Self Care | Payer: Managed Care, Other (non HMO) | Source: Ambulatory Visit | Attending: Family Medicine | Admitting: Family Medicine

## 2023-05-03 DIAGNOSIS — Z0000 Encounter for general adult medical examination without abnormal findings: Secondary | ICD-10-CM | POA: Diagnosis not present

## 2023-05-03 DIAGNOSIS — E039 Hypothyroidism, unspecified: Secondary | ICD-10-CM | POA: Diagnosis present

## 2023-05-05 NOTE — Progress Notes (Unsigned)
Name: Monica Hardin   MRN: [id number]    DOB: [birth year]   Date:05/08/2023       Progress Note  Subjective  Chief Complaint  Weight Gain/ Fatigue  HPI  Weight gain: she has a history of hypothyroidism due to Hashimoto's thyroiditis. Last TSH was suppressed. She gained 10 lbs since her last visit with me in Dec 2023 . She is waiting to see a Hashimoto's sub specialist in [place]. She feels like her abdomen is high and legs swells in the middle of the week. She has gained 18 lbs since last visit   Chronic fatigue: ongoing, but worse with the weight gain. She has also noticed intermittent wheezing. Denies orthopnea but has sob with activity   Snoring: daytime fatigue and weight gain, discussed sleep study, ESS today was  11   Right ear pain: she noticed right ear pain overnight , she developed rhinorrhea and nasal congestion yesterday, right ear is very painful and having difficulty hearing from right side, no fever or chills   Patient Active Problem List   Diagnosis Date Noted   Neurogenic claudication due to lumbar spinal stenosis 12/07/2022   Varicose veins of both lower extremities 02/27/2019   Primary osteoarthritis of both hands 06/15/2018   Primary osteoarthritis of both knees 06/15/2018   Primary osteoarthritis of both feet 06/15/2018   Positive ANA (antinuclear antibody) 06/15/2018   Fibromyalgia 06/15/2018   Encounter for screening colonoscopy 11/07/2016   Surgical menopause on hormone replacement therapy 09/01/2016   Vitamin D deficiency 04/20/2016   Low vitamin B12 level 04/20/2016   History of uterine leiomyoma 04/19/2016   Sciatica, right side 04/19/2016   Status post TAH-BSO 11/12/2015   History of ovarian cyst 11/12/2015   Dyslipidemia 07/04/2015   Adult hypothyroidism 07/04/2015   Excess weight 07/04/2015   Disturbance of skin sensation 07/04/2015   Lipoma of arm 12/03/2014   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    Past  Surgical History:  Procedure Laterality Date   ABDOMINAL HYSTERECTOMY N/A 10/05/2015   Procedure: HYSTERECTOMY ABDOMINAL;  Surgeon: Herold Harms, MD;  Location: [facility];  Service: Gynecology;  Laterality: N/A;   CHOLECYSTECTOMY N/A 03/27/2017   Procedure: LAPAROSCOPIC CHOLECYSTECTOMY;  Surgeon: Henrene Dodge, MD;  Location: [facility];  Service: General;  Laterality: N/A;   COLONOSCOPY WITH PROPOFOL N/A 11/22/2016   Procedure: COLONOSCOPY WITH PROPOFOL;  Surgeon: Earline Mayotte, MD;  Location: [facility];  Service: Endoscopy;  Laterality: N/A;   FRACTURE SURGERY     LAPAROSCOPY N/A 08/24/2015   Procedure: LAPAROSCOPY DIAGNOSTIC;  Surgeon: Herold Harms, MD;  Location: [facility];  Service: Gynecology;  Laterality: N/A;   LEEP  [id number]   [facility]   MANDIBLE FRACTURE SURGERY  1996   SALPINGOOPHORECTOMY Bilateral 10/05/2015   Procedure: SALPINGO OOPHORECTOMY;  Surgeon: Herold Harms, MD;  Location: [facility];  Service: Gynecology;  Laterality: Bilateral;    Family History  Problem Relation Age of Onset   Lung cancer Mother    Cancer Mother    Lung cancer Father    Cancer Father    Kidney Stones Daughter    Asthma Son    Breast cancer Paternal Aunt 70   Diabetes Paternal Grandmother    Thyroid disease Sister     Social History   Tobacco Use   Smoking status: Never   Smokeless tobacco: Never  Substance Use Topics   Alcohol use: No    Alcohol/week: 0.0 standard drinks of alcohol  Current Outpatient Medications:    Cyanocobalamin (B-12) 1000 MCG SUBL, Place 1 tablet under the tongue daily., Disp: 100 tablet, Rfl: 1   DULoxetine (CYMBALTA) 60 MG capsule, Take 1 capsule (60 mg total) by mouth daily., Disp: 90 capsule, Rfl: 1   estradiol (ESTRACE) 0.5 MG tablet, TAKE 1/2 TO 1 TABLET(0.25 TO 0.5 MG) BY MOUTH DAILY, Disp: 90 tablet, Rfl: 0   ipratropium (ATROVENT) 0.06 % nasal spray, Place 2 sprays into both nostrils 4 (four) times daily., Disp: 15 mL, Rfl: 12    levothyroxine (SYNTHROID) 125 MCG tablet, Take 125 mcg by mouth daily., Disp: , Rfl:    Magnesium Glycinate 100 MG CAPS, Take 1 capsule by mouth daily at 12 noon., Disp: 100 capsule, Rfl: 1   pregabalin (LYRICA) 75 MG capsule, Take 1-2 capsules (75-150 mg total) by mouth 2 (two) times daily., Disp: 270 capsule, Rfl: 1   traZODone (DESYREL) 50 MG tablet, Take 1 tablet (50 mg total) by mouth at bedtime., Disp: 90 tablet, Rfl: 1   Vitamin D, Ergocalciferol, (DRISDOL) 1.25 MG (50000 UNIT) CAPS capsule, Take 1 capsule (50,000 Units total) by mouth once a week., Disp: 12 capsule, Rfl: 1   zinc gluconate 50 MG tablet, Take 1 tablet (50 mg total) by mouth daily., Disp: 100 tablet, Rfl: 1  Allergies  Allergen Reactions   Statins     myalgia    I personally reviewed active problem list, medication list, allergies, family history, social history, health maintenance with the patient/caregiver today.   ROS  Constitutional: Negative for fever , positive for weight change.  Respiratory: Negative for cough and shortness of breath.   Cardiovascular: Negative for chest pain or palpitations.  Gastrointestinal: Negative for abdominal pain, no bowel changes.  Musculoskeletal: Negative for gait problem or joint swelling.  Skin: Negative for rash.  Neurological: Negative for dizziness or headache.  No other specific complaints in a complete review of systems (except as listed in HPI above).   Objective  Vitals:   05/08/23 1309  BP: 124/76  Pulse: 94  Resp: 16  SpO2: 99%  Weight: 204 lb (92.5 kg)  Height: 5\' 6"  (1.676 m)    Body mass index is 32.93 kg/m.  Physical Exam  Constitutional: Patient appears well-developed and well-nourished. Obese  No distress.  HEENT: head atraumatic, normocephalic, pupils equal and reactive to light, ears right TM with effusion and erythema , neck supple, throat mild erythema  Cardiovascular: Normal rate, regular rhythm and normal heart sounds.  No murmur heard.  Trace BLE edema. Pulmonary/Chest: Effort normal and breath sounds normal. No respiratory distress. Abdominal: Soft.  There is no tenderness. Psychiatric: Patient has a normal mood and affect. behavior is normal. Judgment and thought content normal.    PHQ2/9:    05/08/2023    1:08 PM 01/23/2023   10:22 AM 12/07/2022    1:10 PM 11/04/2022    1:55 PM 09/19/2022    2:20 PM  Depression screen PHQ 2/9  Decreased Interest 0 0 0 0 0  Down, Depressed, Hopeless 2 0 0 0 0  PHQ - 2 Score 2 0 0 0 0  Altered sleeping 2 0 0 0 0  Tired, decreased energy 3 0 0 0 0  Change in appetite 0 0 0 0 0  Feeling bad or failure about yourself  0 0 0 0 0  Trouble concentrating 0 0 0 0 0  Moving slowly or fidgety/restless 0 0 0 0 0  Suicidal thoughts 0 0 0 0  0  PHQ-9 Score 7 0 0 0 0  Difficult doing work/chores  Not difficult at all  Not difficult at all Not difficult at all    phq 9 is positive   Fall Risk:    05/08/2023    1:08 PM 01/23/2023   10:22 AM 12/07/2022    1:12 PM 11/04/2022    1:55 PM 09/19/2022    2:20 PM  Fall Risk   Falls in the past year? 0 0 0 0 0  Number falls in past yr: 0 0  0 0  Injury with Fall? 0 0  0 0  Risk for fall due to : No Fall Risks  No Fall Risks No Fall Risks No Fall Risks  Follow up Falls prevention discussed Falls prevention discussed;Education provided;Falls evaluation completed Falls prevention discussed;Education provided;Falls evaluation completed Falls prevention discussed;Education provided;Falls evaluation completed Falls prevention discussed;Education provided      Functional Status Survey: Is the patient deaf or have difficulty hearing?: No Does the patient have difficulty seeing, even when wearing glasses/contacts?: No Does the patient have difficulty concentrating, remembering, or making decisions?: No Does the patient have difficulty walking or climbing stairs?: Yes Does the patient have difficulty dressing or bathing?: No Does the patient have  difficulty doing errands alone such as visiting a doctor's office or shopping?: No    Assessment & Plan  1. Weight gain  She states she does not over eat and does not want to follow a fad diet, she is avoiding salt. Cannot do much of regular physical activity due to her chronic fatigue symptoms   2. Hypothyroidism due to Hashimoto's thyroiditis  Under the care of Dr. Tedd Sias and going to see another sub-specialist in [place]   3. CFIDS (chronic fatigue and immune dysfunction syndrome) (HCC)  Getting worse   4. Lower extremity edema  - B Nat Peptide - COMPLETE METABOLIC PANEL WITH GFR - CBC with Differential/Platelet  5. SOB (shortness of breath) on exertion  - B Nat Peptide - COMPLETE METABOLIC PANEL WITH GFR - CBC with Differential/Platelet  6. Acute right otitis media  - amoxicillin-clavulanate (AUGMENTIN) 875-125 MG tablet; Take 1 tablet by mouth 2 (two) times daily.  Dispense: 14 tablet; Refill: 0

## 2023-05-08 ENCOUNTER — Ambulatory Visit (INDEPENDENT_AMBULATORY_CARE_PROVIDER_SITE_OTHER): Payer: Managed Care, Other (non HMO) | Admitting: Family Medicine

## 2023-05-08 ENCOUNTER — Encounter: Payer: Self-pay | Admitting: Family Medicine

## 2023-05-08 VITALS — BP 124/76 | HR 94 | Resp 16 | Ht 66.0 in | Wt 204.0 lb

## 2023-05-08 DIAGNOSIS — R600 Localized edema: Secondary | ICD-10-CM | POA: Diagnosis not present

## 2023-05-08 DIAGNOSIS — R635 Abnormal weight gain: Secondary | ICD-10-CM

## 2023-05-08 DIAGNOSIS — E038 Other specified hypothyroidism: Secondary | ICD-10-CM

## 2023-05-08 DIAGNOSIS — R0602 Shortness of breath: Secondary | ICD-10-CM

## 2023-05-08 DIAGNOSIS — G9332 Myalgic encephalomyelitis/chronic fatigue syndrome: Secondary | ICD-10-CM | POA: Diagnosis not present

## 2023-05-08 DIAGNOSIS — R400 Somnolence: Secondary | ICD-10-CM

## 2023-05-08 DIAGNOSIS — D8989 Other specified disorders involving the immune mechanism, not elsewhere classified: Secondary | ICD-10-CM

## 2023-05-08 DIAGNOSIS — R0683 Snoring: Secondary | ICD-10-CM

## 2023-05-08 DIAGNOSIS — H6691 Otitis media, unspecified, right ear: Secondary | ICD-10-CM

## 2023-05-08 DIAGNOSIS — E063 Autoimmune thyroiditis: Secondary | ICD-10-CM

## 2023-05-08 MED ORDER — AMOXICILLIN-POT CLAVULANATE 875-125 MG PO TABS
1.0000 | ORAL_TABLET | Freq: Two times a day (BID) | ORAL | 0 refills | Status: DC
Start: 2023-05-08 — End: 2023-05-23

## 2023-05-09 ENCOUNTER — Other Ambulatory Visit: Payer: Self-pay | Admitting: Family Medicine

## 2023-05-09 LAB — CBC WITH DIFFERENTIAL/PLATELET
Basos: 0 %
EOS (ABSOLUTE): 0.2 10*3/uL (ref 0.0–0.4)
Eos: 3 %
Hemoglobin: 13.9 g/dL (ref 11.1–15.9)
Lymphs: 26 %
MCH: 30.5 pg (ref 26.6–33.0)
Monocytes Absolute: 0.5 10*3/uL (ref 0.1–0.9)
Neutrophils Absolute: 5.9 10*3/uL (ref 1.4–7.0)
RBC: 4.55 x10E6/uL (ref 3.77–5.28)

## 2023-05-09 LAB — COMPREHENSIVE METABOLIC PANEL
ALT: 45 IU/L — ABNORMAL HIGH (ref 0–32)
Albumin/Globulin Ratio: 1.7 (ref 1.2–2.2)
Albumin: 4.5 g/dL (ref 3.8–4.9)
BUN: 16 mg/dL (ref 6–24)
Bilirubin Total: 0.3 mg/dL (ref 0.0–1.2)
Globulin, Total: 2.6 g/dL (ref 1.5–4.5)
Sodium: 139 mmol/L (ref 134–144)

## 2023-05-09 LAB — BRAIN NATRIURETIC PEPTIDE

## 2023-05-10 ENCOUNTER — Other Ambulatory Visit: Payer: Self-pay | Admitting: Family Medicine

## 2023-05-10 ENCOUNTER — Encounter: Payer: Self-pay | Admitting: Family Medicine

## 2023-05-10 DIAGNOSIS — R7401 Elevation of levels of liver transaminase levels: Secondary | ICD-10-CM

## 2023-05-10 LAB — CBC WITH DIFFERENTIAL/PLATELET
Basophils Absolute: 0 10*3/uL (ref 0.0–0.2)
Hematocrit: 42.3 % (ref 34.0–46.6)
Immature Grans (Abs): 0 10*3/uL (ref 0.0–0.1)
Immature Granulocytes: 0 %
Lymphocytes Absolute: 2.3 10*3/uL (ref 0.7–3.1)
MCHC: 32.9 g/dL (ref 31.5–35.7)
MCV: 93 fL (ref 79–97)
Monocytes: 5 %
Neutrophils: 66 %
Platelets: 320 10*3/uL (ref 150–450)
RDW: 12.2 % (ref 11.7–15.4)
WBC: 9.1 10*3/uL (ref 3.4–10.8)

## 2023-05-10 LAB — COMPREHENSIVE METABOLIC PANEL
AST: 28 IU/L (ref 0–40)
Alkaline Phosphatase: 96 IU/L (ref 44–121)
BUN/Creatinine Ratio: 18 (ref 9–23)
CO2: 25 mmol/L (ref 20–29)
Calcium: 9.4 mg/dL (ref 8.7–10.2)
Chloride: 102 mmol/L (ref 96–106)
Creatinine, Ser: 0.9 mg/dL (ref 0.57–1.00)
Glucose: 86 mg/dL (ref 70–99)
Potassium: 4.3 mmol/L (ref 3.5–5.2)
Total Protein: 7.1 g/dL (ref 6.0–8.5)
eGFR: 75 mL/min/{1.73_m2} (ref >59)

## 2023-05-17 ENCOUNTER — Other Ambulatory Visit: Payer: Self-pay | Admitting: Family Medicine

## 2023-05-17 DIAGNOSIS — R7401 Elevation of levels of liver transaminase levels: Secondary | ICD-10-CM

## 2023-05-17 LAB — HEPATIC FUNCTION PANEL
ALT: 50 IU/L — ABNORMAL HIGH (ref 0–32)
AST: 37 IU/L (ref 0–40)
Albumin: 4.3 g/dL (ref 3.8–4.9)
Alkaline Phosphatase: 87 IU/L (ref 44–121)
Bilirubin Total: 0.2 mg/dL (ref 0.0–1.2)
Bilirubin, Direct: <0.1 mg/dL (ref 0.00–0.40)
Total Protein: 6.7 g/dL (ref 6.0–8.5)

## 2023-05-18 LAB — ACUTE VIRAL HEPATITIS (HAV, HBV, HCV)
HCV Ab: NONREACTIVE
Hep A IgM: NEGATIVE
Hep B C IgM: NEGATIVE
Hepatitis B Surface Ag: NEGATIVE

## 2023-05-18 LAB — IRON,TIBC AND FERRITIN PANEL
Ferritin: 112 ng/mL (ref 15–150)
Iron Saturation: 22 % (ref 15–55)
Iron: 68 ug/dL (ref 27–159)
Total Iron Binding Capacity: 314 ug/dL (ref 250–450)
UIBC: 246 ug/dL (ref 131–425)

## 2023-05-18 LAB — HCV INTERPRETATION

## 2023-05-18 LAB — ALPHA-1-ANTITRYPSIN: A-1 Antitrypsin: 130 mg/dL (ref 101–187)

## 2023-05-23 ENCOUNTER — Ambulatory Visit
Admission: EM | Admit: 2023-05-23 | Discharge: 2023-05-23 | Disposition: A | Discharge status: Home / Self Care | Payer: Managed Care, Other (non HMO) | Attending: Emergency Medicine | Admitting: Emergency Medicine

## 2023-05-23 DIAGNOSIS — H9201 Otalgia, right ear: Secondary | ICD-10-CM | POA: Diagnosis not present

## 2023-05-23 DIAGNOSIS — J0140 Acute pansinusitis, unspecified: Secondary | ICD-10-CM | POA: Diagnosis not present

## 2023-05-23 DIAGNOSIS — M26621 Arthralgia of right temporomandibular joint: Secondary | ICD-10-CM | POA: Diagnosis not present

## 2023-05-23 MED ORDER — DOXYCYCLINE HYCLATE 100 MG PO CAPS: 100.0000 mg | ORAL_CAPSULE | Freq: Two times a day (BID) | ORAL | 0 refills | Status: AC

## 2023-05-23 MED ORDER — NAPROXEN 500 MG PO TABS: 500.0000 mg | ORAL_TABLET | Freq: Two times a day (BID) | ORAL | 0 refills | Status: DC

## 2023-05-23 MED ORDER — IPRATROPIUM BROMIDE 0.06 % NA SOLN: 2.0000 | Freq: Four times a day (QID) | NASAL | 0 refills | Status: DC

## 2023-05-23 MED ORDER — PREDNISONE 20 MG PO TABS: 40.0000 mg | ORAL_TABLET | Freq: Every day | ORAL | 0 refills | Status: AC

## 2023-05-23 MED ORDER — HYDROCOD POLI-CHLORPHE POLI ER 10-8 MG/5ML PO SUER: 5.0000 mL | Freq: Two times a day (BID) | ORAL | 0 refills | Status: DC | PRN

## 2023-05-23 NOTE — ED Provider Notes (Signed)
HPI  SUBJECTIVE:  Monica Hardin is a 57 y.o. female who presents with 5 days of right ear pain, heaviness, sore throat, raspy voice, nasal congestion, greenish bloody rhinorrhea, frontal sinus pain and pressure, postnasal drip, facial swelling in the right maxillary area.  She reports a dry cough that is keeping her up all night.  Denies wheezing, shortness of breath, fevers, body aches.  No change in hearing, otorrhea, upper dental pain.  She was treated for right otitis media/sinusitis 2 weeks ago with 7 days of Augmentin, which she finished 8 days ago.  She states that she was well for 3 days, and then her symptoms returned.  She had a negative home COVID test today.  She tried tea with honey, essential oils, Tylenol.  The Tylenol helps the sore throat.  No aggravating factors.  No antipyretic in the past 6 hours.  No allergy or GERD symptoms.  No known strep exposure.  She does not grind her teeth at night.  She has a past medical history of allergies that bother her this time of year, but is taking Zyrtec now.  She also has a history of fibromyalgia, Hashimoto's.  PCP: [facility].   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   Chronic fatigue    Hyperlipidemia    Hypothyroidism    Overweight    Primary osteoarthritis of both hands 06/15/2018   Sciatica, right side    Thyroid disease     Past Surgical History:  Procedure Laterality Date   ABDOMINAL HYSTERECTOMY N/A 10/05/2015   Procedure: HYSTERECTOMY ABDOMINAL;  Surgeon: Herold Harms, MD;  Location: [facility];  Service: Gynecology;  Laterality: N/A;   CHOLECYSTECTOMY N/A 03/27/2017   Procedure: LAPAROSCOPIC CHOLECYSTECTOMY;  Surgeon: Henrene Dodge, MD;  Location: [facility];  Service: General;  Laterality: N/A;   COLONOSCOPY WITH PROPOFOL N/A 11/22/2016   Procedure: COLONOSCOPY WITH PROPOFOL;  Surgeon: Earline Mayotte, MD;  Location: [facility];  Service: Endoscopy;  Laterality: N/A;    FRACTURE SURGERY     LAPAROSCOPY N/A 08/24/2015   Procedure: LAPAROSCOPY DIAGNOSTIC;  Surgeon: Herold Harms, MD;  Location: [facility];  Service: Gynecology;  Laterality: N/A;   LEEP  [id number]   [facility]   MANDIBLE FRACTURE SURGERY  1996   SALPINGOOPHORECTOMY Bilateral 10/05/2015   Procedure: SALPINGO OOPHORECTOMY;  Surgeon: Herold Harms, MD;  Location: [facility];  Service: Gynecology;  Laterality: Bilateral;    Family History  Problem Relation Age of Onset   Lung cancer Mother    Cancer Mother    Lung cancer Father    Cancer Father    Kidney Stones Daughter    Asthma Son    Breast cancer Paternal Aunt 62   Diabetes Paternal Grandmother    Thyroid disease Sister     Social History   Tobacco Use   Smoking status: Never   Smokeless tobacco: Never  Vaping Use   Vaping Use: Never used  Substance Use Topics   Alcohol use: No    Alcohol/week: 0.0 standard drinks of alcohol   Drug use: Never    No current facility-administered medications for this encounter.  Current Outpatient Medications:    chlorpheniramine-HYDROcodone (TUSSIONEX) 10-8 MG/5ML, Take 5 mLs by mouth every 12 (twelve) hours as needed for cough., Disp: 60 mL, Rfl: 0   Cyanocobalamin (B-12) 1000 MCG SUBL, Place 1 tablet under the tongue daily., Disp: 100 tablet, Rfl: 1   doxycycline (VIBRAMYCIN)  100 MG capsule, Take 1 capsule (100 mg total) by mouth 2 (two) times daily for 10 days., Disp: 20 capsule, Rfl: 0   DULoxetine (CYMBALTA) 60 MG capsule, Take 1 capsule (60 mg total) by mouth daily., Disp: 90 capsule, Rfl: 1   estradiol (ESTRACE) 0.5 MG tablet, TAKE 1/2 TO 1 TABLET(0.25 TO 0.5 MG) BY MOUTH DAILY, Disp: 90 tablet, Rfl: 0   ipratropium (ATROVENT) 0.06 % nasal spray, Place 2 sprays into both nostrils 4 (four) times daily., Disp: 15 mL, Rfl: 0   levothyroxine (SYNTHROID) 125 MCG tablet, Take 125 mcg by mouth daily., Disp: , Rfl:    Magnesium Glycinate 100 MG CAPS, Take 1 capsule by mouth daily at 12  noon., Disp: 100 capsule, Rfl: 1   naproxen (NAPROSYN) 500 MG tablet, Take 1 tablet (500 mg total) by mouth 2 (two) times daily., Disp: 20 tablet, Rfl: 0   predniSONE (DELTASONE) 20 MG tablet, Take 2 tablets (40 mg total) by mouth daily with breakfast for 5 days., Disp: 10 tablet, Rfl: 0   pregabalin (LYRICA) 75 MG capsule, Take 1-2 capsules (75-150 mg total) by mouth 2 (two) times daily., Disp: 270 capsule, Rfl: 1   traZODone (DESYREL) 50 MG tablet, Take 1 tablet (50 mg total) by mouth at bedtime., Disp: 90 tablet, Rfl: 1   Vitamin D, Ergocalciferol, (DRISDOL) 1.25 MG (50000 UNIT) CAPS capsule, Take 1 capsule (50,000 Units total) by mouth once a week., Disp: 12 capsule, Rfl: 1   zinc gluconate 50 MG tablet, Take 1 tablet (50 mg total) by mouth daily., Disp: 100 tablet, Rfl: 1  Allergies  Allergen Reactions   Statins     myalgia     ROS  As noted in HPI.   Physical Exam  BP 115/73 (BP Location: Left Arm)   Pulse 72   Temp 98.3 F (36.8 C) (Oral)   Ht 5\' 6"  (1.676 m)   Wt 90.7 kg   LMP 09/29/2015 Comment: upreg neg  SpO2 98%   BMI 32.28 kg/m   Constitutional: Well developed, well nourished, no acute distress.  Repeatedly clearing throat. Eyes:  EOMI, conjunctiva normal bilaterally HENT: Normocephalic, atraumatic,mucus membranes moist.  Right ear: Decreased hearing compared to left.  Right external ear, EAC, TM normal.  No pain with traction on pinna, palpation of mastoid.  Positive tenderness with palpation of tragus.  Positive right TMJ tenderness.  No crepitus.  Left external ear, EAC, TM normal.  No TMJ tenderness.  Positive nasal congestion.  Normal turbinates.  Positive maxillary, frontal sinus tenderness.  Limited view of oropharynx.  No obvious erythema. Neck: No cervical lymphadenopathy Respiratory: Normal inspiratory effort, lungs clear bilaterally Cardiovascular: Normal rate, regular rhythm, no murmurs rubs or gallops GI: nondistended skin: No rash, skin  intact Musculoskeletal: no deformities Neurologic: Alert & oriented x 3, no focal neuro deficits Psychiatric: Speech and behavior appropriate   ED Course   Medications - No data to display  No orders of the defined types were placed in this encounter.   No results found for this or any previous visit (from the past 24 hour(s)). No results found.  ED Clinical Impression  1. Acute non-recurrent pansinusitis   2. Arthralgia of right temporomandibular joint   3. Acute otalgia, right      ED Assessment/Plan    [facility] ([facility]) narcotic database reviewed.  No opiate prescriptions since 2022  1.  Otalgia-suspect TMJ arthralgia.    She does not have an ear infection.  I believe her pain  is coming from her TMJ.  Will send home with prednisone, Naprosyn/Tylenol, soft diet.  2.  Acute pansinusitis-did not fully respond to the Augmentin.  Sending home with 10 days of doxycycline.  Doubt pneumonia at this point in time.  Sent home with saline nasal irrigation, Atrovent nasal spray, Tussionex for the cough.  I believe the cough and sore throat is coming from the postnasal drip.  Benadryl/Maalox mixture for the sore throat..  Follow-up with PCP as needed.  Discussed  MDM, treatment plan, and plan for follow-up with patient.  patient agrees with plan.   Meds ordered this encounter  Medications   predniSONE (DELTASONE) 20 MG tablet    Sig: Take 2 tablets (40 mg total) by mouth daily with breakfast for 5 days.    Dispense:  10 tablet    Refill:  0   chlorpheniramine-HYDROcodone (TUSSIONEX) 10-8 MG/5ML    Sig: Take 5 mLs by mouth every 12 (twelve) hours as needed for cough.    Dispense:  60 mL    Refill:  0   ipratropium (ATROVENT) 0.06 % nasal spray    Sig: Place 2 sprays into both nostrils 4 (four) times daily.    Dispense:  15 mL    Refill:  0   naproxen (NAPROSYN) 500 MG tablet    Sig: Take 1 tablet (500 mg total) by mouth 2 (two) times daily.    Dispense:  20 tablet    Refill:   0   doxycycline (VIBRAMYCIN) 100 MG capsule    Sig: Take 1 capsule (100 mg total) by mouth 2 (two) times daily for 10 days.    Dispense:  20 capsule    Refill:  0      *This clinic note was created using Scientist, clinical (histocompatibility and immunogenetics). Therefore, there may be occasional mistakes despite careful proofreading.  ?    Domenick Gong, MD 05/23/23 1625

## 2023-05-23 NOTE — ED Triage Notes (Signed)
Pt c/o cough x1week  Pt was given an antibiotic last week for a sinus infection but states that symptoms started again on Thursday and she now has a dry cough and nasal drainage.   Pt took a home covid test and it was negative this morning

## 2023-05-23 NOTE — Discharge Instructions (Signed)
I believe your ear pain is coming from your temporomandibular jaw joint.  Soft diet for the next few days, finish the prednisone, take the Naprosyn with 1000 mg of Tylenol twice a day.  This will also help with pain and swelling in your sinuses.  Saline nasal irrigation with a [employer]Med sinus rinse and distilled water as often as you want, Atrovent nasal spray for the postnasal drip.  Tussionex for the cough.  Fortunately, I am able to prescribe doxycycline, which is a safer drug than Levaquin for your sinus infection.  Finish the doxycycline, even if you feel better.  5 mL of children's liquid Benadryl mix with 5 mL of Maalox together 3-4 times a day as needed for sore throat.

## 2023-05-24 ENCOUNTER — Telehealth: Payer: Self-pay | Admitting: Family Medicine

## 2023-05-24 NOTE — Telephone Encounter (Signed)
Copied from CRM [id number]. Topic: General - Other >> May 24, 2023 11:16 AM Carrielelia G wrote: On December 13th, patient had a CPE ... But she states it was billed wrong. It was billed as an office visit and should have be billed as her yearly physical.  Please advise

## 2023-05-28 NOTE — Addendum Note (Signed)
Addended by: Ruel Favors on: 05/28/2023 06:42 PM   Modules accepted: Level of Service

## 2023-05-29 ENCOUNTER — Encounter: Payer: Self-pay | Admitting: Family Medicine

## 2023-05-29 ENCOUNTER — Other Ambulatory Visit: Payer: Self-pay | Admitting: Family Medicine

## 2023-05-29 ENCOUNTER — Ambulatory Visit
Admission: RE | Admit: 2023-05-29 | Discharge: 2023-05-29 | Disposition: A | Discharge status: Home / Self Care | Payer: Managed Care, Other (non HMO) | Source: Ambulatory Visit | Attending: Family Medicine | Admitting: Family Medicine

## 2023-05-29 DIAGNOSIS — R7401 Elevation of levels of liver transaminase levels: Secondary | ICD-10-CM | POA: Insufficient documentation

## 2023-05-29 DIAGNOSIS — K760 Fatty (change of) liver, not elsewhere classified: Secondary | ICD-10-CM

## 2023-05-29 NOTE — Telephone Encounter (Signed)
Pt verbally informed that he has been updated and sent to billing department for reprocessing and that it could take up to 30-45 days for insurance to update the claim. Pt verbalized understanding.

## 2023-05-29 NOTE — Telephone Encounter (Signed)
Coding has been updated and sent to our billing department to reprocess.  Please allow 30-45 days for insurance to update the claim.

## 2023-06-08 ENCOUNTER — Ambulatory Visit: Payer: Managed Care, Other (non HMO) | Admitting: Family Medicine

## 2023-06-08 NOTE — Progress Notes (Signed)
Name: Monica Hardin   MRN: [id number]    DOB: 09-Dec-1966   Date:06/09/2023       Progress Note  Subjective  Chief Complaint  Follow Up  HPI  FMS: she saw Dr. Gareth Morgan back in 05/2018 and was given formal diagnosis of FMS. She started on Duloxetine years ago we added Lyrica in January  2020 and she states the combination has been helping with symptoms. . She continues to have body aches intermittent but morning  stiffness is still daily but stable. Marland Kitchen She went to [facility] for a second opinion , she was seen by Dr. Azucena Fallen on 03/30/2021 Anti-DFS 70 Ab was positive. Last CRP and sed rate negative done 09/22 She states currently regiment seems to be working well for her   Weight gain: very frustrated due to increase in weight, even though she eats healthier than ever, also has fatigue, feels puffy and swollen, she was seen in May and labs were normal except for mildly elevated LFT's - she will see GI. She is also going to see Hashimoto's thyroiditis  in [place]   Polyarthralgia: seen by Dr. Drusilla Kanner back in 2019 and ANA was high and also inflammatory markers, we have been following levels, she had a second opinion at [facility] 03/2021 saw Dr. Azucena Fallen , infammatory markers back to normal . She was given reassurance that pain was secondary to FMS not a auto immune disorder    Hypothyroidism: she has been on levothyroxine for years, TPO positive and she was seen by Dr. Tedd Sias who is prescribing the levothyroxine , she is compliant with therapy. Monitored by Endo and compliant    Pre-diabetes: last hgbA1C was up from 5.4 % to 5.9 % , she has elevated liver enzymes. She is gaining weight increase in fatigue and is waiting to see GI for abnormal LFT's    Dyslipidemia; not on medication, on life style modification only. She has very high LDL at 170, she just changed her diet and LDL is down to 132    Chronic fatigue: she is able to work a full time job, currently working from home and has helped her ,  she has been walking or going to the gym 3 times per week.  She states she still goes to bed early - between 9-9:30 and wakes up between 5:30 -6 am She states Trazodone helps her fall and stay asleep. She tried provigil but could not tolerate it in the past   Neurogenic claudication : she developed low back pain over the past since Spring 2023, finally seen here and had abnormal X-ray lumbar spine, referred neurosurgeon and MRI showed findings below , she was referred PMR and had steroid injections Spring 2024 and is doing well now   IMPRESSIO 10/29/2022 : 1. Mild bilateral facet arthropathy at L4-5 and L5-S1. 2. No significant lumbar spine disc protrusion, foraminal stenosis or central canal stenosis. 3. No acute osseous injury of the lumbar spine.   Patient Active Problem List   Diagnosis Date Noted   Neurogenic claudication due to lumbar spinal stenosis 12/07/2022   Varicose veins of both lower extremities 02/27/2019   Primary osteoarthritis of both hands 06/15/2018   Primary osteoarthritis of both knees 06/15/2018   Primary osteoarthritis of both feet 06/15/2018   Positive ANA (antinuclear antibody) 06/15/2018   Fibromyalgia 06/15/2018   Encounter for screening colonoscopy 11/07/2016   Surgical menopause on hormone replacement therapy 09/01/2016   Vitamin D deficiency 04/20/2016   Low vitamin B12 level  04/20/2016   History of uterine leiomyoma 04/19/2016   Sciatica, right side 04/19/2016   Status post TAH-BSO 11/12/2015   History of ovarian cyst 11/12/2015   Dyslipidemia 07/04/2015   Adult hypothyroidism 07/04/2015   Excess weight 07/04/2015   Disturbance of skin sensation 07/04/2015   Lipoma of arm 12/03/2014   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    Past Surgical History:  Procedure Laterality Date   ABDOMINAL HYSTERECTOMY N/A 10/05/2015   Procedure: HYSTERECTOMY ABDOMINAL;  Surgeon: Herold Harms, MD;  Location: [facility] ORS;   Service: Gynecology;  Laterality: N/A;   CHOLECYSTECTOMY N/A 03/27/2017   Procedure: LAPAROSCOPIC CHOLECYSTECTOMY;  Surgeon: Henrene Dodge, MD;  Location: [facility] ORS;  Service: General;  Laterality: N/A;   COLONOSCOPY WITH PROPOFOL N/A 11/22/2016   Procedure: COLONOSCOPY WITH PROPOFOL;  Surgeon: Earline Mayotte, MD;  Location: [facility] END[facility];  Service: Endoscopy;  Laterality: N/A;   FRACTURE SURGERY     LAPAROSCOPY N/A 08/24/2015   Procedure: LAPAROSCOPY DIAGNOSTIC;  Surgeon: Herold Harms, MD;  Location: [facility] ORS;  Service: Gynecology;  Laterality: N/A;   LEEP  [id number]   [facility]   MANDIBLE FRACTURE SURGERY  1996   SALPINGOOPHORECTOMY Bilateral 10/05/2015   Procedure: SALPINGO OOPHORECTOMY;  Surgeon: Herold Harms, MD;  Location: [facility] ORS;  Service: Gynecology;  Laterality: Bilateral;    Family History  Problem Relation Age of Onset   Lung cancer Mother    Cancer Mother    Lung cancer Father    Cancer Father    Kidney Stones Daughter    Asthma Son    Breast cancer Paternal Aunt 29   Diabetes Paternal Grandmother    Thyroid disease Sister     Social History   Tobacco Use   Smoking status: Never   Smokeless tobacco: Never  Substance Use Topics   Alcohol use: No    Alcohol/week: 0.0 standard drinks of alcohol     Current Outpatient Medications:    Cyanocobalamin (B-12) 1000 MCG SUBL, Place 1 tablet under the tongue daily., Disp: 100 tablet, Rfl: 1   DULoxetine (CYMBALTA) 60 MG capsule, Take 1 capsule (60 mg total) by mouth daily., Disp: 90 capsule, Rfl: 1   estradiol (ESTRACE) 0.5 MG tablet, TAKE 1/2 TO 1 TABLET(0.25 TO 0.5 MG) BY MOUTH DAILY, Disp: 90 tablet, Rfl: 0   levothyroxine (SYNTHROID) 125 MCG tablet, Take 125 mcg by mouth daily., Disp: , Rfl:    Magnesium Glycinate 100 MG CAPS, Take 1 capsule by mouth daily at 12 noon., Disp: 100 capsule, Rfl: 1   naproxen (NAPROSYN) 500 MG tablet, Take 1 tablet (500 mg total) by mouth 2 (two) times daily., Disp: 20 tablet,  Rfl: 0   pregabalin (LYRICA) 75 MG capsule, Take 1-2 capsules (75-150 mg total) by mouth 2 (two) times daily., Disp: 270 capsule, Rfl: 1   traZODone (DESYREL) 50 MG tablet, Take 1 tablet (50 mg total) by mouth at bedtime., Disp: 90 tablet, Rfl: 1   Vitamin D, Ergocalciferol, (DRISDOL) 1.25 MG (50000 UNIT) CAPS capsule, Take 1 capsule (50,000 Units total) by mouth once a week., Disp: 12 capsule, Rfl: 1   zinc gluconate 50 MG tablet, Take 1 tablet (50 mg total) by mouth daily., Disp: 100 tablet, Rfl: 1  Allergies  Allergen Reactions   Statins     myalgia    I personally reviewed active problem list, medication list, allergies, family history, social history, health maintenance with the patient/caregiver today.   ROS  Constitutional: Negative  for fever or weight change.  Respiratory: Negative for cough and shortness of breath.   Cardiovascular: Negative for chest pain or palpitations.  Gastrointestinal: Negative for abdominal pain, no bowel changes.  Musculoskeletal: Negative for gait problem or joint swelling.  Skin: Negative for rash.  Neurological: Negative for dizziness or headache.  No other specific complaints in a complete review of systems (except as listed in HPI above).   Objective  Vitals:   06/09/23 0934  BP: 116/84  Pulse: 91  Resp: 16  SpO2: 97%  Weight: 204 lb (92.5 kg)  Height: 5\' 6"  (1.676 m)    Body mass index is 32.93 kg/m.  Physical Exam  Constitutional: Patient appears well-developed and well-nourished. Obese  No distress.  HEENT: head atraumatic, normocephalic, pupils equal and reactive to light, neck supple Cardiovascular: Normal rate, regular rhythm and normal heart sounds.  No murmur heard. No BLE edema. Pulmonary/Chest: Effort normal and breath sounds normal. No respiratory distress. Abdominal: Soft.  There is no tenderness. Psychiatric: Patient has a normal mood and affect. behavior is normal. Judgment and thought content normal.   Recent  Results (from the past 2160 hour(s))  B Nat Peptide     Status: None   Collection Time: 05/08/23  2:04 PM  Result Value Ref Range   BNP 11.0 0.0 - 100.0 pg/mL    Comment: Siemens ADVIA Centaur XP methodology  CBC with Differential/Platelet     Status: None   Collection Time: 05/08/23  2:04 PM  Result Value Ref Range   WBC 9.1 3.4 - 10.8 x10E3/uL   RBC 4.55 3.77 - 5.28 x10E6/uL   Hemoglobin 13.9 11.1 - 15.9 g/dL   Hematocrit 16.1 09.6 - 46.6 %   MCV 93 79 - 97 fL   MCH 30.5 26.6 - 33.0 pg   MCHC 32.9 31.5 - 35.7 g/dL   RDW 04.5 40.9 - 81.1 %   Platelets 320 150 - 450 x10E3/uL   Neutrophils 66 Not Estab. %   Lymphs 26 Not Estab. %   Monocytes 5 Not Estab. %   Eos 3 Not Estab. %   Basos 0 Not Estab. %   Neutrophils Absolute 5.9 1.4 - 7.0 x10E3/uL   Lymphocytes Absolute 2.3 0.7 - 3.1 x10E3/uL   Monocytes Absolute 0.5 0.1 - 0.9 x10E3/uL   EOS (ABSOLUTE) 0.2 0.0 - 0.4 x10E3/uL   Basophils Absolute 0.0 0.0 - 0.2 x10E3/uL   Immature Granulocytes 0 Not Estab. %   Immature Grans (Abs) 0.0 0.0 - 0.1 x10E3/uL  Comprehensive metabolic panel     Status: Abnormal   Collection Time: 05/08/23  2:04 PM  Result Value Ref Range   Glucose 86 70 - 99 mg/dL   BUN 16 6 - 24 mg/dL   Creatinine, Ser 9.14 0.57 - 1.00 mg/dL   eGFR 75 >78 GN/FAO/1.30   BUN/Creatinine Ratio 18 9 - 23   Sodium 139 134 - 144 mmol/L   Potassium 4.3 3.5 - 5.2 mmol/L   Chloride 102 96 - 106 mmol/L   CO2 25 20 - 29 mmol/L   Calcium 9.4 8.7 - 10.2 mg/dL   Total Protein 7.1 6.0 - 8.5 g/dL   Albumin 4.5 3.8 - 4.9 g/dL   Globulin, Total 2.6 1.5 - 4.5 g/dL   Albumin/Globulin Ratio 1.7 1.2 - 2.2   Bilirubin Total 0.3 0.0 - 1.2 mg/dL   Alkaline Phosphatase 96 44 - 121 IU/L   AST 28 0 - 40 IU/L   ALT 45 (H) 0 -  32 IU/L  Hepatic function panel     Status: Abnormal   Collection Time: 05/16/23  9:39 AM  Result Value Ref Range   Total Protein 6.7 6.0 - 8.5 g/dL   Albumin 4.3 3.8 - 4.9 g/dL   Bilirubin Total 0.2 0.0 - 1.2  mg/dL   Bilirubin, Direct <1.61 0.00 - 0.40 mg/dL   Alkaline Phosphatase 87 44 - 121 IU/L   AST 37 0 - 40 IU/L   ALT 50 (H) 0 - 32 IU/L  Iron, TIBC and Ferritin Panel     Status: None   Collection Time: 05/17/23 10:00 AM  Result Value Ref Range   Total Iron Binding Capacity 314 250 - 450 ug/dL   UIBC 096 045 - 409 ug/dL   Iron 68 27 - 811 ug/dL   Iron Saturation 22 15 - 55 %   Ferritin 112 15 - 150 ng/mL  Alpha-1-antitrypsin     Status: None   Collection Time: 05/17/23 10:00 AM  Result Value Ref Range   A-1 Antitrypsin 130 101 - 187 mg/dL  Acute Viral Hepatitis (HAV, HBV, HCV)     Status: None   Collection Time: 05/17/23 10:00 AM  Result Value Ref Range   Hep A IgM Negative Negative   Hepatitis B Surface Ag Negative Negative   Hep B C IgM Negative Negative   HCV Ab Non Reactive Non Reactive  Interpretation:     Status: None   Collection Time: 05/17/23 10:00 AM  Result Value Ref Range   HCV Interp 1: Comment     Comment: Not infected with HCV unless early or acute infection is suspected (which may be delayed in an immunocompromised individual), or other evidence exists to indicate HCV infection.     PHQ2/9:    06/09/2023    9:34 AM 05/08/2023    1:08 PM 01/23/2023   10:22 AM 12/07/2022    1:10 PM 11/04/2022    1:55 PM  Depression screen PHQ 2/9  Decreased Interest 0 0 0 0 0  Down, Depressed, Hopeless 0 2 0 0 0  PHQ - 2 Score 0 2 0 0 0  Altered sleeping 0 2 0 0 0  Tired, decreased energy 3 3 0 0 0  Change in appetite 3 0 0 0 0  Feeling bad or failure about yourself  0 0 0 0 0  Trouble concentrating 0 0 0 0 0  Moving slowly or fidgety/restless 0 0 0 0 0  Suicidal thoughts 0 0 0 0 0  PHQ-9 Score 6 7 0 0 0  Difficult doing work/chores   Not difficult at all  Not difficult at all    phq 9 is negative   Fall Risk:    06/09/2023    9:34 AM 05/08/2023    1:08 PM 01/23/2023   10:22 AM 12/07/2022    1:12 PM 11/04/2022    1:55 PM  Fall Risk   Falls in the past year?  0 0 0 0 0  Number falls in past yr: 0 0 0  0  Injury with Fall? 0 0 0  0  Risk for fall due to : No Fall Risks No Fall Risks  No Fall Risks No Fall Risks  Follow up Falls prevention discussed Falls prevention discussed Falls prevention discussed;Education provided;Falls evaluation completed Falls prevention discussed;Education provided;Falls evaluation completed Falls prevention discussed;Education provided;Falls evaluation completed      Functional Status Survey: Is the patient deaf or have difficulty hearing?: No Does the  patient have difficulty seeing, even when wearing glasses/contacts?: No Does the patient have difficulty concentrating, remembering, or making decisions?: No Does the patient have difficulty walking or climbing stairs?: Yes Does the patient have difficulty dressing or bathing?: No Does the patient have difficulty doing errands alone such as visiting a doctor's office or shopping?: No    Assessment & Plan  1. Hypothyroidism due to Hashimoto's thyroiditis  Last TSH was at goal   2. CFIDS (chronic fatigue and immune dysfunction syndrome) (HCC)  Feeling more tired lately   3. Vitamin D deficiency  - Vitamin D, Ergocalciferol, (DRISDOL) 1.25 MG (50000 UNIT) CAPS capsule; Take 1 capsule (50,000 Units total) by mouth once a week.  Dispense: 12 capsule; Refill: 1  4. Low vitamin B12 level  Continue supplements   5. Prediabetes  Discussed last results   6. Fibromyalgia  - DULoxetine (CYMBALTA) 60 MG capsule; Take 1 capsule (60 mg total) by mouth daily.  Dispense: 90 capsule; Refill: 1 - pregabalin (LYRICA) 75 MG capsule; Take 1-2 capsules (75-150 mg total) by mouth 2 (two) times daily.  Dispense: 270 capsule; Refill: 1  7. Insomnia, unspecified type  - traZODone (DESYREL) 50 MG tablet; Take 1 tablet (50 mg total) by mouth at bedtime.  Dispense: 90 tablet; Refill: 1  8. Weight gain  Waiting to see Hashimoto's sub-specialist   9. Elevated liver  enzymes  Keep follow up with GI

## 2023-06-09 ENCOUNTER — Ambulatory Visit: Payer: Managed Care, Other (non HMO) | Admitting: Family Medicine

## 2023-06-09 ENCOUNTER — Encounter: Payer: Self-pay | Admitting: Family Medicine

## 2023-06-09 VITALS — BP 116/84 | HR 91 | Resp 16 | Ht 66.0 in | Wt 204.0 lb

## 2023-06-09 DIAGNOSIS — R7303 Prediabetes: Secondary | ICD-10-CM

## 2023-06-09 DIAGNOSIS — R7989 Other specified abnormal findings of blood chemistry: Secondary | ICD-10-CM

## 2023-06-09 DIAGNOSIS — E559 Vitamin D deficiency, unspecified: Secondary | ICD-10-CM | POA: Diagnosis not present

## 2023-06-09 DIAGNOSIS — R748 Abnormal levels of other serum enzymes: Secondary | ICD-10-CM

## 2023-06-09 DIAGNOSIS — R635 Abnormal weight gain: Secondary | ICD-10-CM

## 2023-06-09 DIAGNOSIS — G4700 Insomnia, unspecified: Secondary | ICD-10-CM

## 2023-06-09 DIAGNOSIS — M797 Fibromyalgia: Secondary | ICD-10-CM

## 2023-06-09 DIAGNOSIS — E038 Other specified hypothyroidism: Secondary | ICD-10-CM

## 2023-06-09 DIAGNOSIS — G9332 Myalgic encephalomyelitis/chronic fatigue syndrome: Secondary | ICD-10-CM

## 2023-06-09 DIAGNOSIS — D8989 Other specified disorders involving the immune mechanism, not elsewhere classified: Secondary | ICD-10-CM

## 2023-06-09 DIAGNOSIS — E063 Autoimmune thyroiditis: Secondary | ICD-10-CM

## 2023-06-09 MED ORDER — DULOXETINE HCL 60 MG PO CPEP: 60.0000 mg | ORAL_CAPSULE | Freq: Every day | ORAL | 1 refills | Status: DC

## 2023-06-09 MED ORDER — VITAMIN D (ERGOCALCIFEROL) 1.25 MG (50000 UNIT) PO CAPS: 50000.0000 [IU] | ORAL_CAPSULE | ORAL | 1 refills | Status: DC

## 2023-06-09 MED ORDER — TRAZODONE HCL 50 MG PO TABS: 50.0000 mg | ORAL_TABLET | Freq: Every day | ORAL | 1 refills | Status: DC

## 2023-06-09 MED ORDER — PREGABALIN 75 MG PO CAPS
75.0000 mg | ORAL_CAPSULE | Freq: Two times a day (BID) | ORAL | 1 refills | Status: DC
Start: 2023-06-09 — End: 2023-11-13

## 2023-06-13 ENCOUNTER — Encounter: Payer: Self-pay | Admitting: Family Medicine

## 2023-06-13 ENCOUNTER — Other Ambulatory Visit: Payer: Self-pay

## 2023-06-13 DIAGNOSIS — R400 Somnolence: Secondary | ICD-10-CM

## 2023-06-13 DIAGNOSIS — R0683 Snoring: Secondary | ICD-10-CM

## 2023-06-20 NOTE — Progress Notes (Signed)
Monica Amy, PA-C [address]  [address]  [place], [place] [address]  Main: [phone]  Fax: [phone]   Gastroenterology Consultation  Referring Provider:     Alba Cory, MD Primary Care Physician:  Alba Cory, MD Primary Gastroenterologist:  Monica Amy, PA-C / Dr. Midge Minium  Reason for Consultation:     Elevated liver test, Abdominal Swelling        HPI:   Monica Hardin is a 57 y.o. y/o female referred for consultation & management  by Alba Cory, MD.    Patient has increased abdominal bloating and swelling with extremity edema since December 2023.  She has also noticed a rash all over her body.  She reports fatigue.  Recently saw endocrinologist for follow-up of Hashimoto's disease.  She is worried about liver cirrhosis.  She denies alcohol use.  No family hx of liver disease.  She denies specific abdominal pain, however she has fibromyalgia and hurts everywhere.  Labs 04/2023 showed mildly elevated ALT liver test (45-50).  All other LFTs normal.  Iron and ferritin normal.  Alpha-1 antitrypsin normal.  CBC normal.  Acute viral hepatitis A/B/C labs were Negative.    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  Last abdominal pelvic CT in 2019 showed no acute abnormality.  Previous hysterectomy.  Last colonoscopy 10/2016 by Dr. Lemar Livings showed 1 small 5 mm hyperplastic rectal polyp removed.  Excellent prep.  Repeat in 10 years.  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   Chronic fatigue    Hyperlipidemia    Hypothyroidism    Overweight    Primary osteoarthritis of both hands 06/15/2018   Sciatica, right side    Thyroid disease     Past Surgical History:  Procedure Laterality Date   ABDOMINAL HYSTERECTOMY N/A 10/05/2015   Procedure: HYSTERECTOMY ABDOMINAL;  Surgeon: Herold Harms, MD;  Location: [facility] OR[facility];   Service: Gynecology;  Laterality: N/A;   CHOLECYSTECTOMY N/A 03/27/2017   Procedure: LAPAROSCOPIC CHOLECYSTECTOMY;  Surgeon: Henrene Dodge, MD;  Location: [facility];  Service: General;  Laterality: N/A;   COLONOSCOPY WITH PROPOFOL N/A 11/22/2016   Procedure: COLONOSCOPY WITH PROPOFOL;  Surgeon: Earline Mayotte, MD;  Location: [facility];  Service: Endoscopy;  Laterality: N/A;   FRACTURE SURGERY     LAPAROSCOPY N/A 08/24/2015   Procedure: LAPAROSCOPY DIAGNOSTIC;  Surgeon: Herold Harms, MD;  Location: [facility];  Service: Gynecology;  Laterality: N/A;   LEEP  [id number]   [facility]   MANDIBLE FRACTURE SURGERY  1996   SALPINGOOPHORECTOMY Bilateral 10/05/2015   Procedure: SALPINGO OOPHORECTOMY;  Surgeon: Herold Harms, MD;  Location: [facility] OR[facility];  Service: Gynecology;  Laterality: Bilateral;    Prior to Admission medications   Medication Sig Start Date End Date Taking? Authorizing Provider  Cyanocobalamin (B-12) 1000 MCG SUBL Place 1 tablet under the tongue daily. 02/27/23   Alba Cory, MD  DULoxetine (CYMBALTA) 60 MG capsule Take 1 capsule (60 mg total) by mouth daily. 06/09/23   Alba Cory, MD  levothyroxine (SYNTHROID) 125 MCG tablet Take 125 mcg by mouth daily. 07/07/20   Raj Janus, MD  Magnesium Glycinate 100 MG CAPS Take 1 capsule by mouth daily at 12 noon. 02/28/23   Alba Cory, MD  pregabalin (LYRICA) 75 MG capsule Take 1-2 capsules (75-150 mg total) by mouth 2 (two) times daily. 06/09/23   Sowles, Danna Hefty,  MD  traZODone (DESYREL) 50 MG tablet Take 1 tablet (50 mg total) by mouth at bedtime. 06/09/23   Alba Cory, MD  Vitamin D, Ergocalciferol, (DRISDOL) 1.25 MG (50000 UNIT) CAPS capsule Take 1 capsule (50,000 Units total) by mouth once a week. 06/09/23   Alba Cory, MD  zinc gluconate 50 MG tablet Take 1 tablet (50 mg total) by mouth daily. 02/27/23   Alba Cory, MD    Family History  Problem Relation Age of Onset   Lung cancer Mother    Cancer Mother     Lung cancer Father    Cancer Father    Kidney Stones Daughter    Asthma Son    Breast cancer Paternal Aunt 9   Diabetes Paternal Grandmother    Thyroid disease Sister      Social History   Tobacco Use   Smoking status: Never   Smokeless tobacco: Never  Vaping Use   Vaping Use: Never used  Substance Use Topics   Alcohol use: No    Alcohol/week: 0.0 standard drinks of alcohol   Drug use: Never    Allergies as of 06/21/2023 - Review Complete 06/21/2023  Allergen Reaction Noted   Statins  12/07/2022    Review of Systems:    All systems reviewed and negative except where noted in HPI.   Physical Exam:  BP 120/82   Pulse 86   Temp 98.2 F (36.8 C)   Ht 5\' 6"  (1.676 m)   Wt 205 lb 6.4 oz (93.2 kg)   LMP 09/29/2015 Comment: upreg neg  BMI 33.15 kg/m  Patient's last menstrual period was 09/29/2015. Psych:  Alert and cooperative. Normal mood and affect. General:   Alert,  Well-developed, well-nourished, pleasant and cooperative in NAD Head:  Normocephalic and atraumatic. Eyes:  Sclera clear, no icterus.   Conjunctiva pink. Lungs:  Respirations even and unlabored.  Clear throughout to auscultation.   No wheezes, crackles, or rhonchi. No acute distress. Heart:  Regular rate and rhythm; no murmurs, clicks, rubs, or gallops. Abdomen:  Normal bowel sounds.  No bruits.  Soft, and obese without masses, hepatosplenomegaly or hernias noted.  No Tenderness.  No ascites.  No guarding or rebound tenderness.    Neurologic:  Alert and oriented x3;  grossly normal neurologically. Psych:  Alert and cooperative. Normal mood and affect. Extremities: Ankles appear swollen bilaterally.  1+ edema.  Imaging Studies: US Abdomen Limited RUQ (LIVER/GB)  Result Date: 05/29/2023 CLINICAL DATA:  Elevated LFTs EXAM: ULTRASOUND ABDOMEN LIMITED RIGHT UPPER QUADRANT COMPARISON:  CT abdomen pelvis 11/19/2018 FINDINGS: Gallbladder: Surgically absent Common bile duct: Diameter: 4 mm Liver: Increased  echogenicity. No focal lesion. Portal vein is patent on color Doppler imaging with normal direction of blood flow towards the liver. Other: There is a 1.3 cm echogenic mass superior pole right kidney compatible with known angiomyolipoma. IMPRESSION: 1. Increased hepatic parenchymal echogenicity suggestive of steatosis. 2. 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 Electronically Signed   By: Annia Belt M.D.   On: 05/29/2023 09:36    Assessment and Plan:   BELLA BRUMMET is a 58 y.o. y/o female has been referred for mildly elevated ALT liver transaminase.  All other LFTs normal.  Recent RUQ ultrasound showed previous cholecystectomy and hepatic steatosis.  Iron and alpha 1 antitrypsin were normal.  Viral hepatitis labs were not performed.  1.  Mildly elevated ALT  Lab: Hepatic panel, ANA, AMA, ASMA, labs to check for immunity to hepatitis A/B.  If she is not immune to hepatitis A/B, then we will offer Twinrix vaccine.  Ordering abdominal pelvic CT to evaluate for abdominal ascites or early cirrhosis given her abdominal swelling.  2.  Hepatic steatosis  Recommend a low-fat diet, regular exercise, and weight loss. Patient education handout about fatty liver disease was given and discussed from up-to-date.   3.  Abdominal Swelling  Abdominal / Pelvic CT with contrast.   Follow up in 3 months.  Monica Amy, PA-C

## 2023-06-21 ENCOUNTER — Encounter: Payer: Self-pay | Admitting: Family Medicine

## 2023-06-21 ENCOUNTER — Encounter: Payer: Self-pay | Admitting: Physician Assistant

## 2023-06-21 ENCOUNTER — Ambulatory Visit: Payer: Managed Care, Other (non HMO) | Admitting: Physician Assistant

## 2023-06-21 VITALS — BP 120/82 | HR 86 | Temp 98.2°F | Ht 66.0 in | Wt 205.4 lb

## 2023-06-21 DIAGNOSIS — R195 Other fecal abnormalities: Secondary | ICD-10-CM | POA: Diagnosis not present

## 2023-06-21 DIAGNOSIS — K760 Fatty (change of) liver, not elsewhere classified: Secondary | ICD-10-CM

## 2023-06-21 DIAGNOSIS — R7989 Other specified abnormal findings of blood chemistry: Secondary | ICD-10-CM | POA: Diagnosis not present

## 2023-06-21 DIAGNOSIS — R1907 Generalized intra-abdominal and pelvic swelling, mass and lump: Secondary | ICD-10-CM

## 2023-06-21 NOTE — Patient Instructions (Addendum)
CT scheduled 06/26/23 @ 12:45 @ [facility].   [address] [place] [address]

## 2023-06-22 ENCOUNTER — Telehealth: Payer: Self-pay

## 2023-06-22 LAB — HEPATITIS B E ANTIBODY: Hep B E Ab: NONREACTIVE

## 2023-06-22 LAB — HEPATIC FUNCTION PANEL
ALT: 52 IU/L — ABNORMAL HIGH (ref 0–32)
AST: 26 IU/L (ref 0–40)
Albumin: 4.6 g/dL (ref 3.8–4.9)
Alkaline Phosphatase: 86 IU/L (ref 44–121)
Bilirubin Total: <0.2 mg/dL (ref 0.0–1.2)
Bilirubin, Direct: <0.1 mg/dL (ref 0.00–0.40)
Total Protein: 7 g/dL (ref 6.0–8.5)

## 2023-06-22 LAB — HEPATITIS B CORE ANTIBODY, TOTAL: Hep B Core Total Ab: NEGATIVE

## 2023-06-22 LAB — HEPATITIS B E ANTIGEN: Hep B E Ag: NEGATIVE

## 2023-06-22 LAB — HEPATITIS A ANTIBODY, TOTAL: hep A Total Ab: NEGATIVE

## 2023-06-22 LAB — MITOCHONDRIAL/SMOOTH MUSCLE AB PNL
Mitochondrial Ab: <20 Units (ref 0.0–20.0)
Smooth Muscle Ab: 8 Units (ref 0–19)

## 2023-06-22 LAB — HEPATITIS B SURFACE ANTIBODY,QUALITATIVE: Hep B Surface Ab, Qual: NONREACTIVE

## 2023-06-22 LAB — ANA: Anti Nuclear Antibody (ANA): NEGATIVE

## 2023-06-22 NOTE — Telephone Encounter (Signed)
Patient notified -she stated she would call back to schedule the Twinrix vaccine.   1.)  ALT liver test is still mildly elevated, yet stable.  This is likely due to fatty liver disease.  All other liver labs are normal.   2.)  Viral hepatitis A, and B labs are negative.  She is not immune to hepatitis A or B.  Please schedule Twinrix vaccine.  3.)  ANA, AMA, and ASMA labs are normal.  No evidence of autoimmune hepatitis or primary biliary cirrhosis.

## 2023-06-22 NOTE — Progress Notes (Signed)
Notify patient: 1.)  ALT liver test is still mildly elevated, yet stable.  This is likely due to fatty liver disease.  All other liver labs are normal.   2.)  Viral hepatitis A, and B labs are negative.  Monica Hardin is not immune to hepatitis A or B.  Please schedule Twinrix vaccine. 3.)  ANA, AMA, and ASMA labs are normal.  No evidence of autoimmune hepatitis or primary biliary cirrhosis.

## 2023-06-26 ENCOUNTER — Encounter: Payer: Self-pay | Admitting: Family Medicine

## 2023-06-26 ENCOUNTER — Ambulatory Visit
Admission: RE | Admit: 2023-06-26 | Discharge: 2023-06-26 | Disposition: A | Discharge status: Home / Self Care | Payer: Managed Care, Other (non HMO) | Source: Ambulatory Visit | Attending: Physician Assistant | Admitting: Physician Assistant

## 2023-06-26 DIAGNOSIS — R1907 Generalized intra-abdominal and pelvic swelling, mass and lump: Secondary | ICD-10-CM | POA: Diagnosis present

## 2023-06-26 MED ORDER — IOHEXOL 300 MG/ML  SOLN
100.0000 mL | Freq: Once | INTRAMUSCULAR | Status: AC | PRN
  Administered 2023-06-26: 100 mL via INTRAVENOUS

## 2023-06-28 ENCOUNTER — Encounter: Payer: Self-pay | Admitting: Physician Assistant

## 2023-06-28 ENCOUNTER — Other Ambulatory Visit: Payer: Self-pay | Admitting: Family Medicine

## 2023-06-28 DIAGNOSIS — R140 Abdominal distension (gaseous): Secondary | ICD-10-CM

## 2023-07-01 ENCOUNTER — Encounter: Payer: Self-pay | Admitting: Physician Assistant

## 2023-07-02 LAB — FOOD ALLERGY PROFILE
Allergen Corn, IgE: <0.1 kU/L
Clam IgE: <0.1 kU/L
Codfish IgE: <0.1 kU/L
Egg White IgE: <0.1 kU/L
Milk IgE: <0.1 kU/L
Peanut IgE: <0.1 kU/L
Scallop IgE: <0.1 kU/L
Sesame Seed IgE: <0.1 kU/L
Shrimp IgE: <0.1 kU/L
Soybean IgE: <0.1 kU/L
Walnut IgE: <0.1 kU/L
Wheat IgE: <0.1 kU/L

## 2023-07-03 ENCOUNTER — Encounter: Payer: Self-pay | Admitting: Physician Assistant

## 2023-07-03 NOTE — Telephone Encounter (Signed)
I've only ever seen her in Jan for sinusitis so I have no idea what she is referring to or being worked up for at this time. If she has further questions she needs to follow up with PCP or the specialties she has been referred to.

## 2023-07-06 ENCOUNTER — Ambulatory Visit: Payer: Managed Care, Other (non HMO) | Admitting: Gastroenterology

## 2023-07-06 ENCOUNTER — Other Ambulatory Visit: Payer: Self-pay

## 2023-07-06 ENCOUNTER — Telehealth (INDEPENDENT_AMBULATORY_CARE_PROVIDER_SITE_OTHER): Payer: Managed Care, Other (non HMO) | Admitting: Gastroenterology

## 2023-07-06 DIAGNOSIS — R7989 Other specified abnormal findings of blood chemistry: Secondary | ICD-10-CM

## 2023-07-06 DIAGNOSIS — K760 Fatty (change of) liver, not elsewhere classified: Secondary | ICD-10-CM

## 2023-07-06 NOTE — Progress Notes (Signed)
Wyline Mood , MD [address]  [address]  [place], [place] [address]  Main: [phone]  Fax: [phone]   Primary Care Physician: Alba Cory, MD  Virtual Visit via Video Note  I connected with patient on 07/06/23 at  2:00 PM EDT by video and verified that I am speaking with the correct person using two identifiers.   I discussed the limitations, risks, security and privacy concerns of performing an evaluation and management service by video  and the availability of in person appointments. I also discussed with the patient that there may be a patient responsible charge related to this service. The patient expressed understanding and agreed to proceed.  Location of Patient: Home Location of Provider: Home Persons involved: Patient and provider only   History of Present Illness: Chief Complaint  Patient presents with   fatty liver    HPI: Monica Hardin is a 57 y.o. female   Summary of history :  She is referred and seen for elevated liver tests on 06/21/2023.  Diagnosed with Hashimoto's disease recently.  Transaminases were normal 7 months back 1 month back alkaline phosphatase 96 AST 28 ALT 45 hemoglobin 13.9 g.  Full liver workup showed normal iron studies negative for viral hepatitis.  ANA negative smooth muscle antibody negative. 06/30/2023 CT scan of the abdomen and pelvis with contrast showed hepatomegaly and hepatic steatosis ultrasound liver shows steatosis.   Interval history   06/21/2023-07/06/2023  Discussed with her the results of the test.  No gross abnormalities noted.  She complains of body swelling she is being evaluated for sleep apnea.  She has seen a cardiologist.  Denies any excess alcohol consumption.     Current Outpatient Medications  Medication Sig Dispense Refill   Cyanocobalamin (B-12) 1000 MCG SUBL Place 1 tablet under the tongue daily. 100 tablet 1   DULoxetine (CYMBALTA) 60 MG capsule Take 1 capsule (60 mg total) by mouth daily.  90 capsule 1   liothyronine (CYTOMEL) 5 MCG tablet Take 5 mcg by mouth daily.     Magnesium Glycinate 100 MG CAPS Take 1 capsule by mouth daily at 12 noon. 100 capsule 1   pregabalin (LYRICA) 75 MG capsule Take 1-2 capsules (75-150 mg total) by mouth 2 (two) times daily. 270 capsule 1   traZODone (DESYREL) 50 MG tablet Take 1 tablet (50 mg total) by mouth at bedtime. 90 tablet 1   Vitamin D, Ergocalciferol, (DRISDOL) 1.25 MG (50000 UNIT) CAPS capsule Take 1 capsule (50,000 Units total) by mouth once a week. 12 capsule 1   zinc gluconate 50 MG tablet Take 1 tablet (50 mg total) by mouth daily. 100 tablet 1   No current facility-administered medications for this visit.    Allergies as of 07/06/2023 - Review Complete 06/21/2023  Allergen Reaction Noted   Statins  12/07/2022    Review of Systems:    All systems reviewed and negative except where noted in HPI.  General Appearance:    Alert, cooperative, no distress, appears stated age  Head:    Normocephalic, without obvious abnormality, atraumatic  Eyes:    PERRL, conjunctiva/corneas clear,  Ears:    Grossly normal hearing    Neurologic:  Grossly normal    Observations/Objective:  Labs: CMP     Component Value Date/Time   NA 139 05/08/2023 1404   K 4.3 05/08/2023 1404   CL 102 05/08/2023 1404   CO2 25 05/08/2023 1404   GLUCOSE 86 05/08/2023 1404   GLUCOSE  114 (H) 03/27/2017 0429   BUN 16 05/08/2023 1404   CREATININE 0.90 05/08/2023 1404   CALCIUM 9.4 05/08/2023 1404   PROT 7.0 06/21/2023 1346   ALBUMIN 4.6 06/21/2023 1346   AST 26 06/21/2023 1346   ALT 52 (H) 06/21/2023 1346   ALKPHOS 86 06/21/2023 1346   BILITOT <0.2 06/21/2023 1346   GFRNONAA 76 09/18/2020 1156   GFRAA 87 09/18/2020 1156   Lab Results  Component Value Date   WBC 9.1 05/08/2023   HGB 13.9 05/08/2023   HCT 42.3 05/08/2023   MCV 93 05/08/2023   PLT 320 05/08/2023    Imaging Studies: CT Abdomen Pelvis W Contrast  Result Date: 06/30/2023 CLINICAL  DATA:  abdominal swelling EXAM: CT ABDOMEN AND PELVIS WITH CONTRAST TECHNIQUE: Multidetector CT imaging of the abdomen and pelvis was performed using the standard protocol following bolus administration of intravenous contrast. RADIATION DOSE REDUCTION: This exam was performed according to the departmental dose-optimization program which includes automated exposure control, adjustment of the mA and/or kV according to patient size and/or use of iterative reconstruction technique. CONTRAST:  OMNIPAQUE IOHEXOL 300 MG/ML  SOLN COMPARISON:  CT abdomen pelvis 11/19/2018 FINDINGS: Lower chest: No acute abnormality. Hepatobiliary: Liver is enlarged up to 20 cm. The hepatic parenchyma is diffusely hypodense compared to the splenic parenchyma consistent with fatty infiltration. No focal liver abnormality. Status post cholecystectomy. No biliary dilatation. Pancreas: No focal lesion. Normal pancreatic contour. No surrounding inflammatory changes. No main pancreatic ductal dilatation. Spleen: Normal in size without focal abnormality. Adrenals/Urinary Tract: No adrenal nodule bilaterally. Bilateral kidneys enhance symmetrically. Subcentimeter hypodensities too small to characterize-no further follow-up indicated. No hydronephrosis. No hydroureter. The urinary bladder is unremarkable. On delayed imaging, there is no urothelial wall thickening and there are no filling defects in the opacified portions of the bilateral collecting systems or ureters. Stomach/Bowel: Stomach is within normal limits. No evidence of bowel wall thickening or dilatation. Appendix appears normal. Vascular/Lymphatic: No abdominal aorta or iliac aneurysm. Mild atherosclerotic plaque of the aorta and its branches. No abdominal, pelvic, or inguinal lymphadenopathy. Reproductive: Status post hysterectomy. No adnexal masses. Other: No intraperitoneal free fluid. No intraperitoneal free gas. No organized fluid collection. Musculoskeletal: No abdominal wall  hernia or abnormality. No suspicious lytic or blastic osseous lesions. No acute displaced fracture. IMPRESSION: 1. Hepatomegaly and hepatic steatosis. 2.  Aortic Atherosclerosis (ICD10-I70.0). 3. No acute intra-abdominal or intrapelvic abnormality. 4. Status post hysterectomy and cholecystectomy. Electronically Signed   By: Tish Frederickson M.D.   On: 06/30/2023 22:01    Assessment and Plan:   Monica Hardin is a 57 y.o. y/o female who is here to follow-up for recent evaluation for abnormal teas and fatty liver disease.  I explained to her that her liver synthetic function is pretty good with albumin of 4.6 she does not have significant liver dysfunction to explain any body swelling.  It could be related to pressure on the right side of the heart from possibly sleep apnea which she is getting evaluated for.   Plan 1.  Check ceruloplasmin, celiac serology, CK, GGT, repeat LFTs 2.  FibroSure scan, discussed about new medication available to treat fatty liver disease if has F2 F3 level fibrosis based on the results can discuss if she would be a candidate for the medication 3.  Recommend to lose weight exercise address cardiovascular risk factors 4.  Proceed with hepatitis a and B vaccination     I discussed the assessment and treatment plan with the patient.  The patient was provided an opportunity to ask questions and all were answered. The patient agreed with the plan and demonstrated an understanding of the instructions.   The patient was advised to call back or seek an in-person evaluation if the symptoms worsen or if the condition fails to improve as anticipated.  I provided 15 minutes of face-to-face time during this encounter.  Dr Wyline Mood MD,MRCP [facility]) Gastroenterology/Hepatology Pager: [phone]   Speech recognition software was used to dictate this note.

## 2023-07-14 LAB — GAMMA GT: GGT: 31 IU/L (ref 0–60)

## 2023-07-14 LAB — NASH FIBROSURE(R) PLUS

## 2023-07-14 LAB — CELIAC DISEASE AB SCREEN W/RFX: Antigliadin Abs, IgA: 4 units (ref 0–19)

## 2023-07-15 LAB — CERULOPLASMIN: Ceruloplasmin: 28.7 mg/dL (ref 19.0–39.0)

## 2023-07-15 LAB — NASH FIBROSURE(R) PLUS
AST (SGOT) P5P: 53 IU/L — ABNORMAL HIGH (ref 0–40)
Apolipoprotein A-1: 144 mg/dL (ref 116–209)
Fibrosis Score: 0.1 (ref 0.00–0.21)
Steatosis Score: 0.77 — ABNORMAL HIGH (ref 0.00–0.40)

## 2023-07-15 LAB — CELIAC DISEASE AB SCREEN W/RFX: IgA/Immunoglobulin A, Serum: 92 mg/dL (ref 87–352)

## 2023-07-17 ENCOUNTER — Encounter: Payer: Self-pay | Admitting: Gastroenterology

## 2023-07-17 LAB — NASH FIBROSURE(R) PLUS
ALPHA 2-MACROGLOBULINS, QN: 157 mg/dL (ref 110–276)
Cholesterol, Total: 223 mg/dL — ABNORMAL HIGH (ref 100–199)
Glucose: 117 mg/dL — ABNORMAL HIGH (ref 70–99)
NASH Score: 0.66 — ABNORMAL HIGH (ref 0.00–0.25)

## 2023-07-18 NOTE — Telephone Encounter (Signed)
I have replied on the message from 07/03/2023

## 2023-07-18 NOTE — Telephone Encounter (Signed)
Recent labs showed no significant fibrosis of the liver but show fat in the liver.  She very likely has fatty liver disease.  based on the results of this test she would not be a candidate for the new drug that has come out based on the results of this test she would not be a candidate for the new drug that has come out because her level of fibrosis is not significant enough to warrant the same.  Would recommend repeat LFTs in 4 to 6 months in the interim she should try to lose weight exercise eat healthy Mediterranean diet.

## 2023-07-19 ENCOUNTER — Other Ambulatory Visit: Payer: Self-pay

## 2023-07-19 DIAGNOSIS — R1907 Generalized intra-abdominal and pelvic swelling, mass and lump: Secondary | ICD-10-CM

## 2023-07-19 DIAGNOSIS — R7989 Other specified abnormal findings of blood chemistry: Secondary | ICD-10-CM

## 2023-07-19 NOTE — Telephone Encounter (Signed)
1. Celiac serology was normal  2. CK was ordered but wasn't done by lab can we check why ?

## 2023-07-20 LAB — CK: Total CK: 84 U/L (ref 32–182)

## 2023-07-20 NOTE — Progress Notes (Signed)
Muscle enzymes normal

## 2023-08-30 ENCOUNTER — Other Ambulatory Visit: Payer: Self-pay

## 2023-08-31 ENCOUNTER — Ambulatory Visit: Payer: Managed Care, Other (non HMO) | Admitting: Gastroenterology

## 2023-08-31 ENCOUNTER — Encounter: Payer: Self-pay | Admitting: Gastroenterology

## 2023-08-31 VITALS — BP 118/77 | HR 74 | Temp 98.6°F | Wt 204.0 lb

## 2023-08-31 DIAGNOSIS — K760 Fatty (change of) liver, not elsewhere classified: Secondary | ICD-10-CM | POA: Diagnosis not present

## 2023-08-31 DIAGNOSIS — Z23 Encounter for immunization: Secondary | ICD-10-CM

## 2023-08-31 NOTE — Progress Notes (Signed)
Wyline Mood MD, MRCP(U.K) [address]  [address]  [place], [place] [address]  Main: [phone]  Fax: [phone]   Primary Care Physician: Alba Cory, MD  Primary Gastroenterologist:  Dr. Wyline Mood   Chief Complaint  Patient presents with   elevated LFTs    HPI: Monica Hardin is a 57 y.o. female Summary of history :   She is referred and seen for elevated liver tests on 06/21/2023.  Diagnosed with Hashimoto's disease recently.   Transaminases were normal 7 months back 1 month back alkaline phosphatase 96 AST 28 ALT 45 hemoglobin 13.9 g.  Full liver workup showed normal iron studies negative for viral hepatitis.  ANA negative smooth muscle antibody negative. 06/30/2023 CT scan of the abdomen and pelvis with contrast showed hepatomegaly and hepatic steatosis ultrasound liver shows steatosis.     Interval history  07/06/2023-08/31/2023   07/13/2023: Fibrosure : no fibrosis seen , S2-s3- severe steatosis , Ceruloplasmin , CK, celiac serol0gy , GGT- normal  Doing well no complaints discussed the results of the recent test for FibroSure.   Current Outpatient Medications  Medication Sig Dispense Refill   Cyanocobalamin (B-12) 1000 MCG SUBL Place 1 tablet under the tongue daily. 100 tablet 1   DULoxetine (CYMBALTA) 60 MG capsule Take 1 capsule (60 mg total) by mouth daily. 90 capsule 1   levothyroxine (SYNTHROID) 100 MCG tablet Take 100 mcg by mouth daily.     liothyronine (CYTOMEL) 5 MCG tablet Take 5 mcg by mouth daily.     Magnesium Glycinate 100 MG CAPS Take 1 capsule by mouth daily at 12 noon. 100 capsule 1   pregabalin (LYRICA) 75 MG capsule Take 1-2 capsules (75-150 mg total) by mouth 2 (two) times daily. 270 capsule 1   traZODone (DESYREL) 50 MG tablet Take 1 tablet (50 mg total) by mouth at bedtime. 90 tablet 1   Vitamin D, Ergocalciferol, (DRISDOL) 1.25 MG (50000 UNIT) CAPS capsule Take 1 capsule (50,000 Units total) by mouth once a week. 12 capsule 1   No  current facility-administered medications for this visit.    Allergies as of 08/31/2023 - Review Complete 08/31/2023  Allergen Reaction Noted   Statins  12/07/2022     ROS:  General: Negative for anorexia, weight loss, fever, chills, fatigue, weakness. ENT: Negative for hoarseness, difficulty swallowing , nasal congestion. CV: Negative for chest pain, angina, palpitations, dyspnea on exertion, peripheral edema.  Respiratory: Negative for dyspnea at rest, dyspnea on exertion, cough, sputum, wheezing.  GI: See history of present illness. GU:  Negative for dysuria, hematuria, urinary incontinence, urinary frequency, nocturnal urination.  Endo: Negative for unusual weight change.    Physical Examination:   BP 118/77   Pulse 74   Temp 98.6 F (37 C) (Oral)   Wt 204 lb (92.5 kg)   LMP 09/29/2015 Comment: upreg neg  BMI 32.93 kg/m   General: Well-nourished, well-developed in no acute distress.  Eyes: No icterus. Conjunctivae pink. Neuro: Alert and oriented x 3.  Grossly intact. Skin: Warm and dry, no jaundice.   Psych: Alert and cooperative, normal mood and affect.   Imaging Studies: No results found.  Assessment and Plan:   Monica Hardin is a 57 y.o. y/o female here to follow-up for recent evaluation for abnormal LFT's and fatty liver disease. Preserved liver function at present.  It could be related to pressure on the right side of the heart from possibly sleep apnea which she is getting evaluated for or  fatty liver. No evidence of liver fibrosis with Fibrosure . Fibrosis 4 Score = .64 (Low risk)    Plan 1.  Recheck Fib 4 score next year with Alba Cory, MD , if > 1.3 refer back to [place]  2.  Recommend to lose weight exercise address cardiovascular risk factors     Dr Wyline Mood  MD,MRCP [facility]) Follow up in as needed

## 2023-09-05 ENCOUNTER — Ambulatory Visit: Payer: Managed Care, Other (non HMO) | Attending: Otolaryngology

## 2023-09-05 DIAGNOSIS — G4761 Periodic limb movement disorder: Secondary | ICD-10-CM | POA: Diagnosis not present

## 2023-09-05 DIAGNOSIS — R400 Somnolence: Secondary | ICD-10-CM | POA: Diagnosis present

## 2023-09-05 DIAGNOSIS — G4736 Sleep related hypoventilation in conditions classified elsewhere: Secondary | ICD-10-CM | POA: Insufficient documentation

## 2023-09-05 DIAGNOSIS — R0683 Snoring: Secondary | ICD-10-CM | POA: Insufficient documentation

## 2023-09-05 DIAGNOSIS — G478 Other sleep disorders: Secondary | ICD-10-CM | POA: Diagnosis not present

## 2023-09-14 ENCOUNTER — Encounter: Payer: Self-pay | Admitting: Family Medicine

## 2023-09-19 ENCOUNTER — Other Ambulatory Visit: Payer: Self-pay | Admitting: Family Medicine

## 2023-09-19 DIAGNOSIS — Z1231 Encounter for screening mammogram for malignant neoplasm of breast: Secondary | ICD-10-CM

## 2023-09-21 ENCOUNTER — Ambulatory Visit: Payer: Managed Care, Other (non HMO) | Admitting: Gastroenterology

## 2023-09-28 ENCOUNTER — Ambulatory Visit: Payer: Managed Care, Other (non HMO)

## 2023-09-28 DIAGNOSIS — Z23 Encounter for immunization: Secondary | ICD-10-CM

## 2023-09-28 NOTE — Progress Notes (Signed)
Per orders of Dr. Tobi Bastos, injection of Hep A/B given in Left Deltoid by Mariabelen Pressly, Brendia Sacks.  Patient tolerated injection well.   3 of 3 inj scheduled for 02/2024

## 2023-10-03 ENCOUNTER — Ambulatory Visit
Admission: RE | Admit: 2023-10-03 | Discharge: 2023-10-03 | Disposition: A | Discharge status: Home / Self Care | Payer: Managed Care, Other (non HMO) | Source: Ambulatory Visit | Attending: Family Medicine | Admitting: Family Medicine

## 2023-10-03 DIAGNOSIS — Z1231 Encounter for screening mammogram for malignant neoplasm of breast: Secondary | ICD-10-CM | POA: Diagnosis present

## 2023-11-03 ENCOUNTER — Ambulatory Visit: Payer: Managed Care, Other (non HMO) | Admitting: Family Medicine

## 2023-11-10 NOTE — Progress Notes (Unsigned)
Name: Monica Hardin   MRN: [id number]    DOB: 1966-05-23   Date:11/13/2023       Progress Note  Subjective  Chief Complaint  Restless Legs  HPI  FMS: she saw Dr. Gareth Morgan back in 05/2018 and was given formal diagnosis of FMS. She started on Duloxetine years ago we added Lyrica in January  2020 and she states the combination has been helping with symptoms however still feels restless at night and we will add one extra lyrica per night.  She continues to have body aches intermittent but morning  stiffness is still daily but stable. She went to [facility] for a second opinion , she was seen by Dr. Azucena Fallen on 03/30/2021 Anti-DFS 70 Ab was positive.     Polyarthralgia: seen by Dr. Drusilla Kanner back in 2019 and ANA was high and also inflammatory markers, we have been following levels, she had a second opinion at [facility] 03/2021 saw Dr. Azucena Fallen , infammatory markers back to normal . She was given reassurance that pain was secondary to FMS not a auto immune disorder .She is now having knee pain bilaterally and also feet pain, we will send her to Ortho   Hypothyroidism/Hashimoto's thyroiditis: she has been on levothyroxine for years, TPO positive and she was seeing  by Dr. Tedd Sias but is now seeing a Hashimoto's thyroiditis sub-specialist in [place] and is now on levothyroxine and Cytomel  Pre-diabetes: last hgbA1C was up from 5.4 % to 5.9 % , she has elevated liver enzymes. She has seen a dietician, is now on a gluten free diet , seen by Dr. Tobi Bastos and studies negative for cirrhosis and will go back once a year. She has poor appetite and not sure why weight is not going down.    Dyslipidemia; not on medication, on life style modification only. She has very high LDL at 170, she just changed her diet and LDL is down to 132    Chronic fatigue Syndrome: she is able to work a full time job ( working from home now )  she has been walking  twice daily for 20 minutes.   She states she still goes to bed early -  between 9-9:30 and wakes up between 5:30 -6 am She states Trazodone helps her fall and stay asleep but had to increase dose to 100 mg and needs a refill . She tried provigil but could not tolerate it in the past .   Nocturnal hypoxemia on Sleep study done Fall 2024: she also has periodic movement limb disorder: she is on Lyrica and we will increase dose, but also get Echo and refer to pulmonologist due to SOB with activity and intermittent wheezing    Neurogenic claudication : she developed low back pain over the past since Spring 2023, finally seen here and had abnormal X-ray lumbar spine, referred neurosurgeon and MRI showed findings below , she was referred PMR and had steroid injections Spring 2024 , she also had nerve ablation and is doing well now    IMPRESSIO 10/29/2022 : 1. Mild bilateral facet arthropathy at L4-5 and L5-S1. 2. No significant lumbar spine disc protrusion, foraminal stenosis or central canal stenosis. 3. No acute osseous injury of the lumbar spine.  Patient Active Problem List   Diagnosis Date Noted   Neurogenic claudication due to lumbar spinal stenosis 12/07/2022   Varicose veins of both lower extremities 02/27/2019   Primary osteoarthritis of both hands 06/15/2018   Primary osteoarthritis of both knees 06/15/2018   Primary osteoarthritis  of both feet 06/15/2018   Positive ANA (antinuclear antibody) 06/15/2018   Fibromyalgia 06/15/2018   Encounter for screening colonoscopy 11/07/2016   Surgical menopause on hormone replacement therapy 09/01/2016   Vitamin D deficiency 04/20/2016   Low vitamin B12 level 04/20/2016   History of uterine leiomyoma 04/19/2016   Sciatica, right side 04/19/2016   Status post TAH-BSO 11/12/2015   History of ovarian cyst 11/12/2015   Dyslipidemia 07/04/2015   Adult hypothyroidism 07/04/2015   Excess weight 07/04/2015   Disturbance of skin sensation 07/04/2015   Lipoma of arm 12/03/2014   CFIDS (chronic fatigue and immune dysfunction  syndrome) (HCC) 05/08/2007    Past Surgical History:  Procedure Laterality Date   ABDOMINAL HYSTERECTOMY N/A 10/05/2015   Procedure: HYSTERECTOMY ABDOMINAL;  Surgeon: Herold Harms, MD;  Location: [facility] ORS;  Service: Gynecology;  Laterality: N/A;   CHOLECYSTECTOMY N/A 03/27/2017   Procedure: LAPAROSCOPIC CHOLECYSTECTOMY;  Surgeon: Henrene Dodge, MD;  Location: [facility] ORS;  Service: General;  Laterality: N/A;   COLONOSCOPY WITH PROPOFOL N/A 11/22/2016   Procedure: COLONOSCOPY WITH PROPOFOL;  Surgeon: Earline Mayotte, MD;  Location: [facility] END[facility]COPY;  Service: Endoscopy;  Laterality: N/A;   FRACTURE SURGERY     LAPAROSCOPY N/A 08/24/2015   Procedure: LAPAROSCOPY DIAGNOSTIC;  Surgeon: Herold Harms, MD;  Location: [facility] ORS;  Service: Gynecology;  Laterality: N/A;   LEEP  [id number]   [facility]   MANDIBLE FRACTURE SURGERY  1996   SALPINGOOPHORECTOMY Bilateral 10/05/2015   Procedure: SALPINGO OOPHORECTOMY;  Surgeon: Herold Harms, MD;  Location: [facility] ORS;  Service: Gynecology;  Laterality: Bilateral;    Family History  Problem Relation Age of Onset   Lung cancer Mother    Cancer Mother    Lung cancer Father    Cancer Father    Kidney Stones Daughter    Asthma Son    Breast cancer Paternal Aunt 43   Diabetes Paternal Grandmother    Thyroid disease Sister     Social History   Tobacco Use   Smoking status: Never   Smokeless tobacco: Never  Substance Use Topics   Alcohol use: No    Alcohol/week: 0.0 standard drinks of alcohol     Current Outpatient Medications:    Cyanocobalamin (B-12) 1000 MCG SUBL, Place 1 tablet under the tongue daily., Disp: 100 tablet, Rfl: 1   levothyroxine (SYNTHROID) 100 MCG tablet, Take 100 mcg by mouth daily., Disp: , Rfl:    liothyronine (CYTOMEL) 5 MCG tablet, Take 5 mcg by mouth daily., Disp: , Rfl:    Magnesium Glycinate 100 MG CAPS, Take 1 capsule by mouth daily at 12 noon., Disp: 100 capsule, Rfl: 1   Vitamin D, Ergocalciferol,  (DRISDOL) 1.25 MG (50000 UNIT) CAPS capsule, Take 1 capsule (50,000 Units total) by mouth once a week., Disp: 12 capsule, Rfl: 1   DULoxetine (CYMBALTA) 60 MG capsule, Take 1 capsule (60 mg total) by mouth daily., Disp: 90 capsule, Rfl: 1   pregabalin (LYRICA) 75 MG capsule, Take 1-2 capsules (75-150 mg total) by mouth 3 (three) times daily. 1 in am, 1 in pm and two at bedtime, Disp: 360 capsule, Rfl: 1   traZODone (DESYREL) 100 MG tablet, Take 1 tablet (100 mg total) by mouth at bedtime., Disp: 90 tablet, Rfl: 1  Allergies  Allergen Reactions   Statins     myalgia    I personally reviewed active problem list with the patient/caregiver today.   ROS  Ten systems reviewed and is negative except as mentioned  in HPI    Objective  Vitals:   11/13/23 0931  BP: 128/78  Pulse: 93  Resp: 16  Temp: 97.8 F (36.6 C)  TempSrc: Oral  SpO2: 95%  Weight: 202 lb 8 oz (91.9 kg)  Height: 5\' 6"  (1.676 m)    Body mass index is 32.68 kg/m.  Physical Exam  Constitutional: Patient appears well-developed and well-nourished. Obese  No distress.  HEENT: head atraumatic, normocephalic, pupils equal and reactive to light,, neck supple Cardiovascular: Normal rate, regular rhythm and normal heart sounds.  No murmur heard. No BLE edema. Pulmonary/Chest: Effort normal and breath sounds normal. No respiratory distress. Abdominal: Soft.  There is no tenderness. Psychiatric: Patient has a normal mood and affect. behavior is normal. Judgment and thought content normal.    PHQ2/9:    11/13/2023    9:30 AM 06/09/2023    9:34 AM 05/08/2023    1:08 PM 01/23/2023   10:22 AM 12/07/2022    1:10 PM  Depression screen PHQ 2/9  Decreased Interest 1 0 0 0 0  Down, Depressed, Hopeless 1 0 2 0 0  PHQ - 2 Score 2 0 2 0 0  Altered sleeping 1 0 2 0 0  Tired, decreased energy 3 3 3  0 0  Change in appetite 0 3 0 0 0  Feeling bad or failure about yourself  1 0 0 0 0  Trouble concentrating 0 0 0 0 0  Moving  slowly or fidgety/restless 0 0 0 0 0  Suicidal thoughts 0 0 0 0 0  PHQ-9 Score 7 6 7  0 0  Difficult doing work/chores Somewhat difficult   Not difficult at all     phq 9 is positive    Fall Risk:    11/13/2023    9:30 AM 06/09/2023    9:34 AM 05/08/2023    1:08 PM 01/23/2023   10:22 AM 12/07/2022    1:12 PM  Fall Risk   Falls in the past year? 0 0 0 0 0  Number falls in past yr:  0 0 0   Injury with Fall?  0 0 0   Risk for fall due to : No Fall Risks No Fall Risks No Fall Risks  No Fall Risks  Follow up Falls prevention discussed Falls prevention discussed Falls prevention discussed Falls prevention discussed;Education provided;Falls evaluation completed Falls prevention discussed;Education provided;Falls evaluation completed    Assessment & Plan  1. Nocturnal hypoxemia  - ECHOCARDIOGRAM COMPLETE; Future - Ambulatory referral to Pulmonology - Iron, TIBC and Ferritin Panel  2. CFIDS (chronic fatigue and immune dysfunction syndrome) (HCC)  Stable  3. Wheezing  - ECHOCARDIOGRAM COMPLETE; Future - Ambulatory referral to Pulmonology  4. SOB (shortness of breath)  - ECHOCARDIOGRAM COMPLETE; Future - Ambulatory referral to Pulmonology  5. Hypothyroidism due to Hashimoto's thyroiditis  Seeing sub-specialist in [place]  6. Need for immunization against influenza  - Flu vaccine trivalent PF, 6mos and older(Flulaval,Afluria,Fluarix,Fluzone)  7. Vitamin D deficiency  - VITAMIN D 25 Hydroxy (Vit-D Deficiency, Fractures)  8. Fibromyalgia  - pregabalin (LYRICA) 75 MG capsule; Take 1-2 capsules (75-150 mg total) by mouth 3 (three) times daily. 1 in am, 1 in pm and two at bedtime  Dispense: 360 capsule; Refill: 1 - DULoxetine (CYMBALTA) 60 MG capsule; Take 1 capsule (60 mg total) by mouth daily.  Dispense: 90 capsule; Refill: 1  9. Prediabetes  - Lipid panel - Hemoglobin A1c - Comprehensive metabolic panel  10. Low vitamin B12 level  -  CBC with  Differential/Platelet - B12 and Folate Panel  11. Insomnia, unspecified type  - traZODone (DESYREL) 100 MG tablet; Take 1 tablet (100 mg total) by mouth at bedtime.  Dispense: 90 tablet; Refill: 1  12. Chronic pain of both knees  - Sedimentation rate - C-reactive protein - Ambulatory referral to Orthopedic Surgery

## 2023-11-13 ENCOUNTER — Ambulatory Visit: Payer: Managed Care, Other (non HMO) | Admitting: Family Medicine

## 2023-11-13 ENCOUNTER — Encounter: Payer: Self-pay | Admitting: Family Medicine

## 2023-11-13 VITALS — BP 128/78 | HR 93 | Temp 97.8°F | Resp 16 | Ht 66.0 in | Wt 202.5 lb

## 2023-11-13 DIAGNOSIS — R0602 Shortness of breath: Secondary | ICD-10-CM | POA: Diagnosis not present

## 2023-11-13 DIAGNOSIS — G9332 Myalgic encephalomyelitis/chronic fatigue syndrome: Secondary | ICD-10-CM | POA: Diagnosis not present

## 2023-11-13 DIAGNOSIS — R062 Wheezing: Secondary | ICD-10-CM

## 2023-11-13 DIAGNOSIS — G4700 Insomnia, unspecified: Secondary | ICD-10-CM

## 2023-11-13 DIAGNOSIS — R7303 Prediabetes: Secondary | ICD-10-CM

## 2023-11-13 DIAGNOSIS — M797 Fibromyalgia: Secondary | ICD-10-CM

## 2023-11-13 DIAGNOSIS — G4734 Idiopathic sleep related nonobstructive alveolar hypoventilation: Secondary | ICD-10-CM | POA: Diagnosis not present

## 2023-11-13 DIAGNOSIS — R7989 Other specified abnormal findings of blood chemistry: Secondary | ICD-10-CM

## 2023-11-13 DIAGNOSIS — E559 Vitamin D deficiency, unspecified: Secondary | ICD-10-CM

## 2023-11-13 DIAGNOSIS — E063 Autoimmune thyroiditis: Secondary | ICD-10-CM

## 2023-11-13 DIAGNOSIS — M25562 Pain in left knee: Secondary | ICD-10-CM

## 2023-11-13 DIAGNOSIS — G8929 Other chronic pain: Secondary | ICD-10-CM

## 2023-11-13 DIAGNOSIS — D8989 Other specified disorders involving the immune mechanism, not elsewhere classified: Secondary | ICD-10-CM

## 2023-11-13 DIAGNOSIS — M25561 Pain in right knee: Secondary | ICD-10-CM

## 2023-11-13 DIAGNOSIS — Z23 Encounter for immunization: Secondary | ICD-10-CM

## 2023-11-13 MED ORDER — TRAZODONE HCL 100 MG PO TABS: 100.0000 mg | ORAL_TABLET | Freq: Every day | ORAL | 1 refills | Status: DC

## 2023-11-13 MED ORDER — DULOXETINE HCL 60 MG PO CPEP: 60.0000 mg | ORAL_CAPSULE | Freq: Every day | ORAL | 1 refills | Status: DC

## 2023-11-13 MED ORDER — PREGABALIN 75 MG PO CAPS
75.0000 mg | ORAL_CAPSULE | Freq: Three times a day (TID) | ORAL | 1 refills | Status: DC
Start: 2023-11-13 — End: 2024-02-14

## 2023-11-13 NOTE — Addendum Note (Signed)
Addended by: Dollene Primrose on: 11/13/2023 10:25 AM   Modules accepted: Orders

## 2023-11-14 LAB — CBC WITH DIFFERENTIAL/PLATELET
Basophils Absolute: 0 10*3/uL (ref 0.0–0.2)
Basos: 0 %
EOS (ABSOLUTE): 0.2 10*3/uL (ref 0.0–0.4)
Eos: 3 %
Hematocrit: 45.1 % (ref 34.0–46.6)
Hemoglobin: 14.2 g/dL (ref 11.1–15.9)
Immature Grans (Abs): 0 10*3/uL (ref 0.0–0.1)
Immature Granulocytes: 0 %
Lymphocytes Absolute: 2.5 10*3/uL (ref 0.7–3.1)
Lymphs: 36 %
MCH: 29.7 pg (ref 26.6–33.0)
MCHC: 31.5 g/dL (ref 31.5–35.7)
MCV: 94 fL (ref 79–97)
Monocytes Absolute: 0.4 10*3/uL (ref 0.1–0.9)
Monocytes: 6 %
Neutrophils Absolute: 3.8 10*3/uL (ref 1.4–7.0)
Neutrophils: 55 %
Platelets: 328 10*3/uL (ref 150–450)
RBC: 4.78 x10E6/uL (ref 3.77–5.28)
RDW: 12.5 % (ref 11.7–15.4)
WBC: 6.9 10*3/uL (ref 3.4–10.8)

## 2023-11-14 LAB — COMPREHENSIVE METABOLIC PANEL
ALT: 33 [IU]/L — ABNORMAL HIGH (ref 0–32)
AST: 29 [IU]/L (ref 0–40)
Albumin: 4.5 g/dL (ref 3.8–4.9)
Alkaline Phosphatase: 99 [IU]/L (ref 44–121)
BUN/Creatinine Ratio: 16 (ref 9–23)
BUN: 16 mg/dL (ref 6–24)
Bilirubin Total: 0.2 mg/dL (ref 0.0–1.2)
CO2: 28 mmol/L (ref 20–29)
Calcium: 9.7 mg/dL (ref 8.7–10.2)
Chloride: 99 mmol/L (ref 96–106)
Creatinine, Ser: 1 mg/dL (ref 0.57–1.00)
Globulin, Total: 2.4 g/dL (ref 1.5–4.5)
Glucose: 121 mg/dL — ABNORMAL HIGH (ref 70–99)
Potassium: 4.2 mmol/L (ref 3.5–5.2)
Sodium: 139 mmol/L (ref 134–144)
Total Protein: 6.9 g/dL (ref 6.0–8.5)
eGFR: 65 mL/min/{1.73_m2} (ref >59)

## 2023-11-14 LAB — IRON,TIBC AND FERRITIN PANEL
Ferritin: 103 ng/mL (ref 15–150)
Iron Saturation: 22 % (ref 15–55)
Iron: 76 ug/dL (ref 27–159)
Total Iron Binding Capacity: 339 ug/dL (ref 250–450)
UIBC: 263 ug/dL (ref 131–425)

## 2023-11-14 LAB — LIPID PANEL
Chol/HDL Ratio: 4.5 ratio — ABNORMAL HIGH (ref 0.0–4.4)
Cholesterol, Total: 201 mg/dL — ABNORMAL HIGH (ref 100–199)
HDL: 45 mg/dL (ref >39)
LDL Chol Calc (NIH): 109 mg/dL — ABNORMAL HIGH (ref 0–99)
Triglycerides: 271 mg/dL — ABNORMAL HIGH (ref 0–149)
VLDL Cholesterol Cal: 47 mg/dL — ABNORMAL HIGH (ref 5–40)

## 2023-11-14 LAB — C-REACTIVE PROTEIN: CRP: 11 mg/L — ABNORMAL HIGH (ref 0–10)

## 2023-11-14 LAB — HEMOGLOBIN A1C
Est. average glucose Bld gHb Est-mCnc: 131 mg/dL
Hgb A1c MFr Bld: 6.2 % — ABNORMAL HIGH (ref 4.8–5.6)

## 2023-11-14 LAB — B12 AND FOLATE PANEL
Folate: 6.4 ng/mL (ref >3.0)
Vitamin B-12: 486 pg/mL (ref 232–1245)

## 2023-11-14 LAB — VITAMIN D 25 HYDROXY (VIT D DEFICIENCY, FRACTURES): Vit D, 25-Hydroxy: 45.8 ng/mL (ref 30.0–100.0)

## 2023-11-14 LAB — SEDIMENTATION RATE: Sed Rate: 6 mm/h (ref 0–40)

## 2023-11-16 ENCOUNTER — Encounter: Payer: Self-pay | Admitting: Internal Medicine

## 2023-11-28 ENCOUNTER — Ambulatory Visit: Payer: Managed Care, Other (non HMO) | Admitting: Family Medicine

## 2023-12-01 NOTE — Progress Notes (Signed)
Name: Monica Hardin   MRN: [id number]    DOB: [birth year]   Date:12/12/2023       Progress Note  Subjective  Chief Complaint  Chief Complaint  Patient presents with   Annual Exam    HPI  Patient presents for annual CPE.  Diet: avoiding gluten and dairy  Exercise: she has been walking 30 minutes three times a week  Last Eye Exam: up to date Last Dental Exam: up to date  [employer] Visit from 12/12/2023 in [facility]  AUDIT-C Score 0      Depression: Phq 9 is  positive    12/12/2023    8:15 AM 11/13/2023    9:30 AM 06/09/2023    9:34 AM 05/08/2023    1:08 PM 01/23/2023   10:22 AM  Depression screen PHQ 2/9  Decreased Interest 1 1 0 0 0  Down, Depressed, Hopeless 1 1 0 2 0  PHQ - 2 Score 2 2 0 2 0  Altered sleeping 1 1 0 2 0  Tired, decreased energy 1 3 3 3  0  Change in appetite 0 0 3 0 0  Feeling bad or failure about yourself  0 1 0 0 0  Trouble concentrating 1 0 0 0 0  Moving slowly or fidgety/restless 0 0 0 0 0  Suicidal thoughts 0 0 0 0 0  PHQ-9 Score 5 7 6 7  0  Difficult doing work/chores Somewhat difficult Somewhat difficult   Not difficult at all   Hypertension: BP Readings from Last 3 Encounters:  12/12/23 124/76  11/13/23 128/78  08/31/23 118/77   Obesity: Wt Readings from Last 3 Encounters:  12/12/23 198 lb 6.4 oz (90 kg)  11/13/23 202 lb 8 oz (91.9 kg)  08/31/23 204 lb (92.5 kg)   BMI Readings from Last 3 Encounters:  12/12/23 32.02 kg/m  11/13/23 32.68 kg/m  08/31/23 32.93 kg/m     Vaccines:   HPV: Aged out  Tdap: 03/26/14 Shingrix: complete Pneumonia: n/a Flu: complete COVID-19:2 doses   Hep C Screening: complete STD testing and prevention (HIV/chl/gon/syphilis): HIV complete Intimate partner violence: negative screen  Sexual History : one partner, no pain or vaginal dryness  Menstrual History/LMP/Abnormal Bleeding: hysterectomy at age 34 yo, had a large ovarian cyst, not cancer  Discussed  importance of follow up if any post-menopausal bleeding: not applicable  Incontinence Symptoms: positive for symptoms but mild at this time, not interested in PT, mostly stress incontinence   Breast cancer:  - Last Mammogram: 10/03/23 - BRCA gene screening: N/A - paternal aunt had breat cancer   Osteoporosis Prevention : Discussed high calcium and vitamin D supplementation, weight bearing exercises Bone density :not applicable   Cervical cancer screening: hysterectomy  Skin cancer: Discussed monitoring for atypical lesions  Colorectal cancer: 11/22/16 Lung cancer:  Low Dose CT Chest recommended if Age 88-80 years, 20 pack-year currently smoking OR have quit w/in 15years. Patient does not qualify for screen   ECG: 05/01/18  Advanced Care Planning: A voluntary discussion about advance care planning including the explanation and discussion of advance directives.  Discussed health care proxy and Living will, and the patient was able to identify a health care proxy as husband.  Patient does have a living will and power of attorney of health care   Lipids: Lab Results  Component Value Date   CHOL 201 (H) 11/13/2023   CHOL 223 (H) 07/13/2023   CHOL 212 (H) 12/08/2022   Lab Results  Component Value Date   HDL 45 11/13/2023   HDL 54 12/08/2022   HDL 56 09/07/2021   Lab Results  Component Value Date   LDLCALC 109 (H) 11/13/2023   LDLCALC 132 (H) 12/08/2022   LDLCALC 170 (H) 09/07/2021   Lab Results  Component Value Date   TRIG 271 (H) 11/13/2023   TRIG 194 (H) 07/13/2023   TRIG 146 12/08/2022   Lab Results  Component Value Date   CHOLHDL 4.5 (H) 11/13/2023   CHOLHDL 3.9 12/08/2022   CHOLHDL 4.4 09/07/2021   No results found for: "LDLDIRECT"  Glucose: Glucose  Date Value Ref Range Status  11/13/2023 121 (H) 70 - 99 mg/dL Final  43/32/9518 86 70 - 99 mg/dL Final  84/16/6063 94 70 - 99 mg/dL Final   Glucose, Bld  Date Value Ref Range Status  03/27/2017 114 (H) 65 - 99  mg/dL Final  01/60/1093 235 (H) 65 - 99 mg/dL Final    Patient Active Problem List   Diagnosis Date Noted   Neurogenic claudication due to lumbar spinal stenosis 12/07/2022   Varicose veins of both lower extremities 02/27/2019   Primary osteoarthritis of both hands 06/15/2018   Primary osteoarthritis of both knees 06/15/2018   Primary osteoarthritis of both feet 06/15/2018   Positive ANA (antinuclear antibody) 06/15/2018   Fibromyalgia 06/15/2018   Encounter for screening colonoscopy 11/07/2016   Surgical menopause on hormone replacement therapy 09/01/2016   Vitamin D deficiency 04/20/2016   Low vitamin B12 level 04/20/2016   History of uterine leiomyoma 04/19/2016   Sciatica, right side 04/19/2016   Status post TAH-BSO 11/12/2015   History of ovarian cyst 11/12/2015   Dyslipidemia 07/04/2015   Adult hypothyroidism 07/04/2015   Excess weight 07/04/2015   Disturbance of skin sensation 07/04/2015   Lipoma of arm 12/03/2014   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    Past Surgical History:  Procedure Laterality Date   ABDOMINAL HYSTERECTOMY N/A 10/05/2015   Procedure: HYSTERECTOMY ABDOMINAL;  Surgeon: Herold Harms, MD;  Location: [facility];  Service: Gynecology;  Laterality: N/A;   CHOLECYSTECTOMY N/A 03/27/2017   Procedure: LAPAROSCOPIC CHOLECYSTECTOMY;  Surgeon: Henrene Dodge, MD;  Location: [facility];  Service: General;  Laterality: N/A;   COLONOSCOPY WITH PROPOFOL N/A 11/22/2016   Procedure: COLONOSCOPY WITH PROPOFOL;  Surgeon: Earline Mayotte, MD;  Location: [facility];  Service: Endoscopy;  Laterality: N/A;   FRACTURE SURGERY     LAPAROSCOPY N/A 08/24/2015   Procedure: LAPAROSCOPY DIAGNOSTIC;  Surgeon: Herold Harms, MD;  Location: [facility];  Service: Gynecology;  Laterality: N/A;   LEEP  [id number]   [facility]   MANDIBLE FRACTURE SURGERY  1996   SALPINGOOPHORECTOMY Bilateral 10/05/2015   Procedure: SALPINGO OOPHORECTOMY;   Surgeon: Herold Harms, MD;  Location: [facility];  Service: Gynecology;  Laterality: Bilateral;    Family History  Problem Relation Age of Onset   Lung cancer Mother    Cancer Mother    Lung cancer Father    Cancer Father    Kidney Stones Daughter    Asthma Son    Breast cancer Paternal Aunt 2   Diabetes Paternal Grandmother    Thyroid disease Sister     Social History   Socioeconomic History   Marital status: Married    Spouse name: Gala Romney   Number of children: 2   Years of education: Not on file   Highest education level: 12th grade  Occupational History   Occupation: office  Employer: [employer]  Tobacco Use   Smoking status: Never   Smokeless tobacco: Never  Vaping Use   Vaping status: Never Used  Substance and Sexual Activity   Alcohol use: Never   Drug use: Never   Sexual activity: Yes    Partners: Male    Birth control/protection: None    Comment: Hysterectomy  Other Topics Concern   Not on file  Social History Narrative   Two grown children, married   Works at [employer]: Low Risk  (11/09/2023)   Overall Financial Resource Strain (CARDIA)    Difficulty of Paying Living Expenses: Not very hard  Food Insecurity: No Food Insecurity (11/09/2023)   Hunger Vital Sign    Worried About Running Out of Food in the Last Year: Never true    Ran Out of Food in the Last Year: Never true  Transportation Needs: No Transportation Needs (11/09/2023)   PRAPARE - Administrator, Civil Service (Medical): No    Lack of Transportation (Non-Medical): No  Physical Activity: Sufficiently Active (11/09/2023)   Exercise Vital Sign    Days of Exercise per Week: 5 days    Minutes of Exercise per Session: 30 min  Stress: No Stress Concern Present (11/09/2023)   [employer] of Occupational Health - Occupational Stress Questionnaire    Feeling of Stress : Only a little  Social Connections: Socially  Integrated (11/09/2023)   Social Connection and Isolation Panel [NHANES]    Frequency of Communication with Friends and Family: More than three times a week    Frequency of Social Gatherings with Friends and Family: More than three times a week    Attends Religious Services: More than 4 times per year    Active Member of [employer] or Organizations: Yes    Attends Engineer, structural: More than 4 times per year    Marital Status: Married  Catering manager Violence: Not At Risk (12/12/2023)   Humiliation, Afraid, Rape, and Kick questionnaire    Fear of Current or Ex-Partner: No    Emotionally Abused: No    Physically Abused: No    Sexually Abused: No     Current Outpatient Medications:    Cyanocobalamin (B-12) 1000 MCG SUBL, Place 1 tablet under the tongue daily., Disp: 100 tablet, Rfl: 1   DULoxetine (CYMBALTA) 60 MG capsule, Take 1 capsule (60 mg total) by mouth daily., Disp: 90 capsule, Rfl: 1   levothyroxine (SYNTHROID) 100 MCG tablet, Take 100 mcg by mouth daily., Disp: , Rfl:    liothyronine (CYTOMEL) 5 MCG tablet, Take 5 mcg by mouth daily., Disp: , Rfl:    Magnesium Glycinate 100 MG CAPS, Take 1 capsule by mouth daily at 12 noon., Disp: 100 capsule, Rfl: 1   pregabalin (LYRICA) 75 MG capsule, Take 1-2 capsules (75-150 mg total) by mouth 3 (three) times daily. 1 in am, 1 in pm and two at bedtime, Disp: 360 capsule, Rfl: 1   traZODone (DESYREL) 100 MG tablet, Take 1 tablet (100 mg total) by mouth at bedtime., Disp: 90 tablet, Rfl: 1   Vitamin D, Ergocalciferol, (DRISDOL) 1.25 MG (50000 UNIT) CAPS capsule, Take 1 capsule (50,000 Units total) by mouth once a week., Disp: 12 capsule, Rfl: 1  Allergies  Allergen Reactions   Statins     myalgia     ROS  Constitutional: Negative for fever , positive for mild weight change. - 5 lbs in the  past month   Respiratory: Negative for cough and shortness of breath.   Cardiovascular: Negative for chest pain or palpitations.   Gastrointestinal: Negative for abdominal pain, no bowel changes.  Musculoskeletal: Negative for gait problem or joint swelling.  Skin: Negative for rash.  Neurological: Negative for dizziness or headache.  No other specific complaints in a complete review of systems (except as listed in HPI above).   Objective  Vitals:   12/12/23 0817  BP: 124/76  Pulse: 73  Resp: 16  Temp: 97.9 F (36.6 C)  TempSrc: Oral  SpO2: 98%  Weight: 198 lb 6.4 oz (90 kg)  Height: 5\' 6"  (1.676 m)    Body mass index is 32.02 kg/m.  Physical Exam  Constitutional: Patient appears well-developed and well-nourished.Obesity No distress.  HENT: Head: Normocephalic and atraumatic. Ears: B TMs ok, no erythema or effusion; Nose: Nose normal. Mouth/Throat: Oropharynx is clear and moist. No oropharyngeal exudate.  Eyes: Conjunctivae and EOM are normal. Pupils are equal, round, and reactive to light. No scleral icterus.  Neck: Normal range of motion. Neck supple. No JVD present. No thyromegaly present.  Cardiovascular: Normal rate, regular rhythm and normal heart sounds.  No murmur heard. No BLE edema. Pulmonary/Chest: Effort normal and breath sounds normal. No respiratory distress. Abdominal: Soft. Bowel sounds are normal, no distension. There is no tenderness. no masses Breast: no lumps or masses, no nipple discharge or rashes FEMALE GENITALIA:  Not done  RECTAL: not done  Musculoskeletal: Normal range of motion, no joint effusions. No gross deformities Neurological: he is alert and oriented to person, place, and time. No cranial nerve deficit. Coordination, balance, strength, speech and gait are normal.  Skin: Skin is warm and dry. No rash noted. No erythema.  Psychiatric: Patient has a normal mood and affect. behavior is normal. Judgment and thought content normal.   Recent Results (from the past 2160 hours)  B12 and Folate Panel     Status: None   Collection Time: 11/13/23 10:39 AM  Result Value Ref  Range   Vitamin B-12 486 232 - 1,245 pg/mL   Folate 6.4 >3.0 ng/mL    Comment: A serum folate concentration of less than 3.1 ng/mL is considered to represent clinical deficiency.   C-reactive protein     Status: Abnormal   Collection Time: 11/13/23 10:39 AM  Result Value Ref Range   CRP 11 (H) 0 - 10 mg/L  CBC with Differential/Platelet     Status: None   Collection Time: 11/13/23 10:39 AM  Result Value Ref Range   WBC 6.9 3.4 - 10.8 x10E3/uL   RBC 4.78 3.77 - 5.28 x10E6/uL   Hemoglobin 14.2 11.1 - 15.9 g/dL   Hematocrit 16.1 09.6 - 46.6 %   MCV 94 79 - 97 fL   MCH 29.7 26.6 - 33.0 pg   MCHC 31.5 31.5 - 35.7 g/dL   RDW 04.5 40.9 - 81.1 %   Platelets 328 150 - 450 x10E3/uL   Neutrophils 55 Not Estab. %   Lymphs 36 Not Estab. %   Monocytes 6 Not Estab. %   Eos 3 Not Estab. %   Basos 0 Not Estab. %   Neutrophils Absolute 3.8 1.4 - 7.0 x10E3/uL   Lymphocytes Absolute 2.5 0.7 - 3.1 x10E3/uL   Monocytes Absolute 0.4 0.1 - 0.9 x10E3/uL   EOS (ABSOLUTE) 0.2 0.0 - 0.4 x10E3/uL   Basophils Absolute 0.0 0.0 - 0.2 x10E3/uL   Immature Granulocytes 0 Not Estab. %   Immature Grans (  Abs) 0.0 0.0 - 0.1 x10E3/uL  Comprehensive metabolic panel     Status: Abnormal   Collection Time: 11/13/23 10:39 AM  Result Value Ref Range   Glucose 121 (H) 70 - 99 mg/dL   BUN 16 6 - 24 mg/dL   Creatinine, Ser 8.29 0.57 - 1.00 mg/dL   eGFR 65 >56 OZ/HYQ/6.57   BUN/Creatinine Ratio 16 9 - 23   Sodium 139 134 - 144 mmol/L   Potassium 4.2 3.5 - 5.2 mmol/L   Chloride 99 96 - 106 mmol/L   CO2 28 20 - 29 mmol/L   Calcium 9.7 8.7 - 10.2 mg/dL   Total Protein 6.9 6.0 - 8.5 g/dL   Albumin 4.5 3.8 - 4.9 g/dL   Globulin, Total 2.4 1.5 - 4.5 g/dL   Bilirubin Total 0.2 0.0 - 1.2 mg/dL   Alkaline Phosphatase 99 44 - 121 IU/L   AST 29 0 - 40 IU/L   ALT 33 (H) 0 - 32 IU/L  Hemoglobin A1c     Status: Abnormal   Collection Time: 11/13/23 10:39 AM  Result Value Ref Range   Hgb A1c MFr Bld 6.2 (H) 4.8 - 5.6 %     Comment:          Prediabetes: 5.7 - 6.4          Diabetes: >6.4          Glycemic control for adults with diabetes: <7.0    Est. average glucose Bld gHb Est-mCnc 131 mg/dL  Iron, TIBC and Ferritin Panel     Status: None   Collection Time: 11/13/23 10:39 AM  Result Value Ref Range   Total Iron Binding Capacity 339 250 - 450 ug/dL   UIBC 846 962 - 952 ug/dL   Iron 76 27 - 841 ug/dL   Iron Saturation 22 15 - 55 %   Ferritin 103 15 - 150 ng/mL  Lipid panel     Status: Abnormal   Collection Time: 11/13/23 10:39 AM  Result Value Ref Range   Cholesterol, Total 201 (H) 100 - 199 mg/dL   Triglycerides 324 (H) 0 - 149 mg/dL   HDL 45 >40 mg/dL   VLDL Cholesterol Cal 47 (H) 5 - 40 mg/dL   LDL Chol Calc (NIH) 102 (H) 0 - 99 mg/dL   Chol/HDL Ratio 4.5 (H) 0.0 - 4.4 ratio    Comment:                                   T. Chol/HDL Ratio                                             Men  Women                               1/2 Avg.Risk  3.4    3.3                                   Avg.Risk  5.0    4.4  2X Avg.Risk  9.6    7.1                                3X Avg.Risk 23.4   11.0   Sedimentation rate     Status: None   Collection Time: 11/13/23 10:39 AM  Result Value Ref Range   Sed Rate 6 0 - 40 mm/hr  VITAMIN D 25 Hydroxy (Vit-D Deficiency, Fractures)     Status: None   Collection Time: 11/13/23 10:39 AM  Result Value Ref Range   Vit D, 25-Hydroxy 45.8 30.0 - 100.0 ng/mL    Comment: Vitamin D deficiency has been defined by the Institute of Medicine and an Endocrine Society practice guideline as a level of serum 25-OH vitamin D less than 20 ng/mL (1,2). The Endocrine Society went on to further define vitamin D insufficiency as a level between 21 and 29 ng/mL (2). 1. IOM (Institute of Medicine). 2010. Dietary reference    intakes for calcium and D. [place]: The    [employer]. 2. Holick MF, Binkley , Bischoff-Ferrari HA, et al.     Evaluation, treatment, and prevention of vitamin D    deficiency: an Endocrine Society clinical practice    guideline. JCEM. 2011 Jul; 96(7):1911-30.      Fall Risk:    11/13/2023    9:30 AM 06/09/2023    9:34 AM 05/08/2023    1:08 PM 01/23/2023   10:22 AM 12/07/2022    1:12 PM  Fall Risk   Falls in the past year? 0 0 0 0 0  Number falls in past yr:  0 0 0   Injury with Fall?  0 0 0   Risk for fall due to : No Fall Risks No Fall Risks No Fall Risks  No Fall Risks  Follow up Falls prevention discussed Falls prevention discussed Falls prevention discussed Falls prevention discussed;Education provided;Falls evaluation completed Falls prevention discussed;Education provided;Falls evaluation completed    Assessment & Plan  1. Well adult exam (Primary)    -USPSTF grade A and B recommendations reviewed with patient; age-appropriate recommendations, preventive care, screening tests, etc discussed and encouraged; healthy living encouraged; see AVS for patient education given to patient -Discussed importance of 150 minutes of physical activity weekly, eat two servings of fish weekly, eat one serving of tree nuts ( cashews, pistachios, pecans, almonds.Marland Kitchen) every other day, eat 6 servings of fruit/vegetables daily and drink plenty of water and avoid sweet beverages.   -Reviewed Health Maintenance: Yes.

## 2023-12-12 ENCOUNTER — Ambulatory Visit (INDEPENDENT_AMBULATORY_CARE_PROVIDER_SITE_OTHER): Payer: Managed Care, Other (non HMO) | Admitting: Family Medicine

## 2023-12-12 ENCOUNTER — Encounter: Payer: Self-pay | Admitting: Family Medicine

## 2023-12-12 VITALS — BP 124/76 | HR 73 | Temp 97.9°F | Resp 16 | Ht 66.0 in | Wt 198.4 lb

## 2023-12-12 DIAGNOSIS — Z0000 Encounter for general adult medical examination without abnormal findings: Secondary | ICD-10-CM

## 2023-12-13 ENCOUNTER — Ambulatory Visit
Admission: RE | Admit: 2023-12-13 | Discharge: 2023-12-13 | Disposition: A | Discharge status: Home / Self Care | Payer: Managed Care, Other (non HMO) | Source: Ambulatory Visit | Attending: Family Medicine | Admitting: Family Medicine

## 2023-12-13 ENCOUNTER — Encounter: Payer: Self-pay | Admitting: Family Medicine

## 2023-12-13 DIAGNOSIS — R0600 Dyspnea, unspecified: Secondary | ICD-10-CM | POA: Diagnosis present

## 2023-12-13 DIAGNOSIS — R5382 Chronic fatigue, unspecified: Secondary | ICD-10-CM | POA: Insufficient documentation

## 2023-12-13 DIAGNOSIS — R0602 Shortness of breath: Secondary | ICD-10-CM | POA: Diagnosis not present

## 2023-12-13 DIAGNOSIS — E785 Hyperlipidemia, unspecified: Secondary | ICD-10-CM | POA: Insufficient documentation

## 2023-12-13 DIAGNOSIS — R062 Wheezing: Secondary | ICD-10-CM

## 2023-12-13 DIAGNOSIS — G4734 Idiopathic sleep related nonobstructive alveolar hypoventilation: Secondary | ICD-10-CM

## 2023-12-13 LAB — ECHOCARDIOGRAM COMPLETE
AR max vel: 2.98 cm2
AV Area VTI: 3.07 cm2
AV Area mean vel: 3.17 cm2
AV Mean grad: 2 mm[Hg]
AV Peak grad: 3.2 mm[Hg]
Ao pk vel: 0.9 m/s
Area-P 1/2: 4.29 cm2
MV VTI: 2.39 cm2
S' Lateral: 2.6 cm

## 2023-12-13 NOTE — Progress Notes (Signed)
*  PRELIMINARY RESULTS* Echocardiogram 2D Echocardiogram has been performed.  Monica Hardin 12/13/2023, 11:28 AM

## 2023-12-14 ENCOUNTER — Other Ambulatory Visit: Payer: Self-pay | Admitting: Family Medicine

## 2023-12-14 DIAGNOSIS — R600 Localized edema: Secondary | ICD-10-CM

## 2023-12-14 DIAGNOSIS — R931 Abnormal findings on diagnostic imaging of heart and coronary circulation: Secondary | ICD-10-CM

## 2023-12-29 ENCOUNTER — Emergency Department: Payer: Managed Care, Other (non HMO)

## 2023-12-29 ENCOUNTER — Other Ambulatory Visit: Payer: Self-pay

## 2023-12-29 ENCOUNTER — Emergency Department
Admission: EM | Admit: 2023-12-29 | Discharge: 2023-12-29 | Disposition: A | Discharge status: Home / Self Care | Payer: Managed Care, Other (non HMO) | Attending: Emergency Medicine | Admitting: Emergency Medicine

## 2023-12-29 DIAGNOSIS — Z20822 Contact with and (suspected) exposure to covid-19: Secondary | ICD-10-CM | POA: Insufficient documentation

## 2023-12-29 DIAGNOSIS — R079 Chest pain, unspecified: Secondary | ICD-10-CM | POA: Insufficient documentation

## 2023-12-29 DIAGNOSIS — E039 Hypothyroidism, unspecified: Secondary | ICD-10-CM | POA: Diagnosis not present

## 2023-12-29 LAB — CBC
HCT: 43.5 % (ref 36.0–46.0)
Hemoglobin: 14.3 g/dL (ref 12.0–15.0)
MCH: 30.8 pg (ref 26.0–34.0)
MCHC: 32.9 g/dL (ref 30.0–36.0)
MCV: 93.8 fL (ref 80.0–100.0)
Platelets: 270 10*3/uL (ref 150–400)
RBC: 4.64 MIL/uL (ref 3.87–5.11)
RDW: 12.8 % (ref 11.5–15.5)
WBC: 5.5 10*3/uL (ref 4.0–10.5)
nRBC: 0 % (ref 0.0–0.2)

## 2023-12-29 LAB — TROPONIN I (HIGH SENSITIVITY)
Troponin I (High Sensitivity): 2 ng/L (ref <18)
Troponin I (High Sensitivity): 3 ng/L (ref <18)

## 2023-12-29 LAB — RESP PANEL BY RT-PCR (RSV, FLU A&B, COVID)  RVPGX2
Influenza A by PCR: NEGATIVE
Influenza B by PCR: NEGATIVE
Resp Syncytial Virus by PCR: NEGATIVE
SARS Coronavirus 2 by RT PCR: NEGATIVE

## 2023-12-29 LAB — BASIC METABOLIC PANEL
Anion gap: 10 (ref 5–15)
BUN: 17 mg/dL (ref 6–20)
CO2: 25 mmol/L (ref 22–32)
Calcium: 8.9 mg/dL (ref 8.9–10.3)
Chloride: 102 mmol/L (ref 98–111)
Creatinine, Ser: 0.93 mg/dL (ref 0.44–1.00)
GFR, Estimated: >60 mL/min (ref >60)
Glucose, Bld: 97 mg/dL (ref 70–99)
Potassium: 3.5 mmol/L (ref 3.5–5.1)
Sodium: 137 mmol/L (ref 135–145)

## 2023-12-29 LAB — D-DIMER, QUANTITATIVE: D-Dimer, Quant: 0.48 ug{FEU}/mL (ref 0.00–0.50)

## 2023-12-29 LAB — BRAIN NATRIURETIC PEPTIDE: B Natriuretic Peptide: 13.8 pg/mL (ref 0.0–100.0)

## 2023-12-29 NOTE — ED Provider Triage Note (Signed)
 Emergency Medicine Provider Triage Evaluation Note  Monica Hardin , a 58 y.o. female  was evaluated in triage.  Pt complains of chest pain with radiation to the left arm for the past week. Seemed worse today. No shortness of breath, nausea, or diaphoresis.  Physical Exam  BP 139/77 (BP Location: Left Arm)   Pulse 78   Temp 98.2 F (36.8 C) (Oral)   Resp 16   Ht 5' 6 (1.676 m)   Wt 90 kg   LMP 09/29/2015 Comment: upreg neg  SpO2 99%   BMI 32.02 kg/m  Gen:   Awake, no distress   Resp:  Normal effort  MSK:   Moves extremities without difficulty  Other:    Medical Decision Making  Medically screening exam initiated at 11:43 AM.  Appropriate orders placed.  Monica Hardin was informed that the remainder of the evaluation will be completed by another provider, this initial triage assessment does not replace that evaluation, and the importance of remaining in the ED until their evaluation is complete.  Cardiac workup started   Monica Hardin, [place] 12/29/23 1454

## 2023-12-29 NOTE — Discharge Instructions (Signed)
 Your blood work, EKG, and chest x-ray are normal today.  Please follow-up with your outpatient provider and cardiologist as you have scheduled.  Please return for any new, worsening, or change in symptoms or other concerns.  It was a pleasure caring for you today.

## 2023-12-29 NOTE — ED Triage Notes (Signed)
 Pt here with cp and left arm pain x 1 week. Pt states the cp is more constant today and describes it as pressure. Pt endorses diarrhea. Pt stable in triage.

## 2023-12-29 NOTE — ED Provider Notes (Signed)
 [facility] Provider Note    Event Date/Time   First MD Initiated Contact with Patient 12/29/23 1357     (approximate)   History   Chest Pain   HPI  Monica Hardin is a 58 y.o. female with a past medical history of hyperlipidemia, hypothyroidism, fibromyalgia who presents today for evaluation of chest pain.  Patient reports that she has noticed this for the past 1 week.  She reports that it is more noticeable when she is sitting.  She denies any worsening chest pain with exertion.  She denies any associated nausea or diaphoresis.  She reports that she always has lower extremity swelling, does not think that it is any worse today.  She denies history of PE or DVT.  She denies any recent trips or travel or periods of immobilization.  She does not feel short of breath.  She has no pleurisy.  She denies family history of sudden cardiac death.  She reports that she has never been a smoker.  No orthopnea or dyspnea on exertion.  Patient Active Problem List   Diagnosis Date Noted   Neurogenic claudication due to lumbar spinal stenosis 12/07/2022   Varicose veins of both lower extremities 02/27/2019   Primary osteoarthritis of both hands 06/15/2018   Primary osteoarthritis of both knees 06/15/2018   Primary osteoarthritis of both feet 06/15/2018   Positive ANA (antinuclear antibody) 06/15/2018   Fibromyalgia 06/15/2018   Encounter for screening colonoscopy 11/07/2016   Surgical menopause on hormone replacement therapy 09/01/2016   Vitamin D  deficiency 04/20/2016   Low vitamin B12 level 04/20/2016   History of uterine leiomyoma 04/19/2016   Sciatica, right side 04/19/2016   Status post TAH-BSO 11/12/2015   History of ovarian cyst 11/12/2015   Dyslipidemia 07/04/2015   Adult hypothyroidism 07/04/2015   Excess weight 07/04/2015   Disturbance of skin sensation 07/04/2015   Lipoma of arm 12/03/2014   CFIDS (chronic fatigue and immune dysfunction syndrome) (HCC)  05/08/2007          Physical Exam   Triage Vital Signs: ED Triage Vitals  Encounter Vitals Group     BP 12/29/23 1140 139/77     Systolic BP Percentile --      Diastolic BP Percentile --      Pulse Rate 12/29/23 1140 78     Resp 12/29/23 1140 16     Temp 12/29/23 1140 98.2 F (36.8 C)     Temp Source 12/29/23 1140 Oral     SpO2 12/29/23 1140 99 %     Weight 12/29/23 1138 198 lb 6.6 oz (90 kg)     Height 12/29/23 1138 5' 6 (1.676 m)     Head Circumference --      Peak Flow --      Pain Score 12/29/23 1138 7     Pain Loc --      Pain Education --      Exclude from Growth Chart --     Most recent vital signs: Vitals:   12/29/23 1140 12/29/23 1524  BP: 139/77 139/76  Pulse: 78 72  Resp: 16 18  Temp: 98.2 F (36.8 C) 97.7 F (36.5 C)  SpO2: 99% 99%    Physical Exam Vitals and nursing note reviewed.  Constitutional:      General: Awake and alert. No acute distress.    Appearance: Normal appearance. The patient is normal weight.  HENT:     Head: Normocephalic and atraumatic.  Mouth: Mucous membranes are moist.  Eyes:     General: PERRL. Normal EOMs        Right eye: No discharge.        Left eye: No discharge.     Conjunctiva/sclera: Conjunctivae normal.  Cardiovascular:     Rate and Rhythm: Normal rate and regular rhythm.     Pulses: Normal pulses.  Equal in all 4 extremities. No JVD Pulmonary:     Effort: Pulmonary effort is normal. No respiratory distress.     Breath sounds: Normal breath sounds.  Abdominal:     Abdomen is soft. There is no abdominal tenderness. No rebound or guarding. No distention. Musculoskeletal:        General: No swelling. Normal range of motion.     Cervical back: Normal range of motion and neck supple. Bilateral lower extremity swelling.  No pitting edema.  No calf tenderness. Skin:    General: Skin is warm and dry.     Capillary Refill: Capillary refill takes less than 2 seconds.     Findings: No rash.  Neurological:      Mental Status: The patient is awake and alert.      ED Results / Procedures / Treatments   Labs (all labs ordered are listed, but only abnormal results are displayed) Labs Reviewed  RESP PANEL BY RT-PCR (RSV, FLU A&B, COVID)  [id number]  BASIC METABOLIC PANEL  CBC  D-DIMER, QUANTITATIVE  BRAIN NATRIURETIC PEPTIDE  TROPONIN I (HIGH SENSITIVITY)  TROPONIN I (HIGH SENSITIVITY)     EKG     RADIOLOGY I independently reviewed and interpreted imaging and agree with radiologists findings.     PROCEDURES:  Critical Care performed:   Procedures   MEDICATIONS ORDERED IN ED: Medications - No data to display   IMPRESSION / MDM / ASSESSMENT AND PLAN / ED COURSE  I reviewed the triage vital signs and the nursing notes.   Differential diagnosis includes, but is not limited to, acute coronary syndrome, DVT/PE, pneumonia,, volume overload.  I reviewed the patient's chart.  Patient had a recent echo in December 2020 for which revealed grade 1 diastolic dysfunction.  Patient was referred to cardiology.  Patient is awake and alert, hemodynamically stable and afebrile.  She has a normal oxygen saturation of 99% on room air.  Her lungs are clear to auscultation bilaterally.  No crackles, no pitting edema in her lower extremities, no JVD, she does not appear to be overtly volume overloaded.  BNP was negative.  Her troponin x 2 was negative.  Her D-dimer is negative, no tachycardia or hypoxia, no pleurisy, no lower extremity signs or symptoms of DVT, do not suspect pulmonary embolism.  No radiation of pain to her back, equal pulses in all 4 extremities, no mediastinal widening, no pain out of proportion, no pain currently, I do not suspect dissection.  Patient was reevaluated multiple times throughout her emergency department stay, and had complete resolution of her symptoms.  Recommended close outpatient follow-up with cardiology and strict return precautions.  Patient and her husband  understand and agree with plan.  Patient was discharged in stable condition.  Patient's presentation is most consistent with acute complicated illness / injury requiring diagnostic workup.     FINAL CLINICAL IMPRESSION(S) / ED DIAGNOSES   Final diagnoses:  Nonspecific chest pain     Rx / DC Orders   ED Discharge Orders     None        Note:  This document was  prepared using Conservation officer, historic buildings and may include unintentional dictation errors.   Zlaty Alexa E, PA-C 12/29/23 1824    Bradler, Evan K, MD 12/30/23 1425

## 2024-01-01 ENCOUNTER — Ambulatory Visit: Payer: Managed Care, Other (non HMO) | Admitting: Family Medicine

## 2024-01-04 ENCOUNTER — Encounter: Payer: Self-pay | Admitting: Internal Medicine

## 2024-01-04 ENCOUNTER — Ambulatory Visit (INDEPENDENT_AMBULATORY_CARE_PROVIDER_SITE_OTHER): Payer: Managed Care, Other (non HMO) | Admitting: Internal Medicine

## 2024-01-04 VITALS — BP 126/74 | HR 70 | Temp 97.8°F | Ht 66.0 in | Wt 201.0 lb

## 2024-01-04 DIAGNOSIS — G4719 Other hypersomnia: Secondary | ICD-10-CM | POA: Diagnosis not present

## 2024-01-04 DIAGNOSIS — R0600 Dyspnea, unspecified: Secondary | ICD-10-CM

## 2024-01-04 NOTE — Progress Notes (Signed)
 Name: Monica Hardin MRN: [id number] DOB: [birth year]    CHIEF COMPLAINT:  EXCESSIVE DAYTIME SLEEPINESS Assessment for shortness of breath   HISTORY OF PRESENT ILLNESS: Patient is seen today for problems and issues with sleep related to excessive daytime sleepiness Patient  has been having sleep problems for many years Patient has been having excessive daytime sleepiness for a long time Patient has been having extreme fatigue and tiredness, lack of energy +  snoring every night + Nonrefreshing sleep   Discussed sleep data and reviewed with patient.  Encouraged proper weight management.  Discussed driving precautions and its relationship with hypersomnolence.  Discussed operating dangerous equipment and its relationship with hypersomnolence.  Discussed sleep hygiene, and benefits of a fixed sleep waked time.  The importance of getting eight or more hours of sleep discussed with patient.  Discussed limiting the use of the computer and television before bedtime.  Decrease naps during the day, so night time sleep will become enhanced.  Limit caffeine, and sleep deprivation.  HTN, stroke, and heart failure are potential risk factors.    PSG 08/2023 did NOT show significant AHI which was 2.4 Overnight pulse oximetry did not show any type of significant hypoxia However will need full overnight pulse oximetry   Patient has significant secondhand smoking history for approximately 25 years of her life Patient has increased shortness of breath over the last several years Progressively worsening over the last several months Chest x-ray in January 2025 did not show any significant abnormalities however will need further testing with pulmonary function test and CT of the chest to assess for underlying abnormalities  No exacerbation at this time No evidence of heart failure at this time No evidence or signs of infection at this time No respiratory distress No fevers, chills, nausea,  vomiting, diarrhea No evidence of lower extremity edema No evidence hemoptysis     PAST MEDICAL HISTORY :   has a past medical history of Cancer (HCC), Chronic fatigue, Hyperlipidemia, Hypothyroidism, Overweight, Primary osteoarthritis of both hands (06/15/2018), Sciatica, right side, and Thyroid  disease.  has a past surgical history that includes LEEP (8001-8000); Mandible fracture surgery (1996); laparoscopy (N/A, 08/24/2015); Fracture surgery; Abdominal hysterectomy (N/A, 10/05/2015); Salpingoophorectomy (Bilateral, 10/05/2015); Colonoscopy with propofol  (N/A, 11/22/2016); and Cholecystectomy (N/A, 03/27/2017). Prior to Admission medications   Medication Sig Start Date End Date Taking? Authorizing Provider  Cyanocobalamin (B-12) 1000 MCG SUBL Place 1 tablet under the tongue daily. 02/27/23   Sowles, Krichna, MD  DULoxetine  (CYMBALTA ) 60 MG capsule Take 1 capsule (60 mg total) by mouth daily. 11/13/23   Sowles, Krichna, MD  levothyroxine  (SYNTHROID ) 100 MCG tablet Take 100 mcg by mouth daily. 06/19/23   [provider]  liothyronine  (CYTOMEL ) 5 MCG tablet Take 5 mcg by mouth daily. 06/19/23   [provider]  Magnesium  Glycinate 100 MG CAPS Take 1 capsule by mouth daily at 12 noon. 02/28/23   Sowles, Krichna, MD  pregabalin  (LYRICA ) 75 MG capsule Take 1-2 capsules (75-150 mg total) by mouth 3 (three) times daily. 1 in am, 1 in pm and two at bedtime 11/13/23   Sowles, Krichna, MD  traZODone  (DESYREL ) 100 MG tablet Take 1 tablet (100 mg total) by mouth at bedtime. 11/13/23   Sowles, Krichna, MD  Vitamin D , Ergocalciferol , (DRISDOL ) 1.25 MG (50000 UNIT) CAPS capsule Take 1 capsule (50,000 Units total) by mouth once a week. 06/09/23   Sowles, Krichna, MD   Allergies  Allergen Reactions   Statins     myalgia  FAMILY HISTORY:  family history includes Asthma in her son; Breast cancer (age of onset: 57) in her paternal aunt; Cancer in her father and mother; Diabetes in her paternal  grandmother; Kidney Stones in her daughter; Lung cancer in her father and mother; Thyroid  disease in her sister. SOCIAL HISTORY:  reports that she has never smoked. She has never used smokeless tobacco. She reports that she does not drink alcohol and does not use drugs.   Review of Systems:  Gen:  Denies  fever, sweats, chills weight loss  HEENT: Denies blurred vision, double vision, ear pain, eye pain, hearing loss, nose bleeds, sore throat Cardiac:  No dizziness, chest pain or heaviness, chest tightness,edema, No JVD Resp:   No cough, -sputum production, -shortness of breath,-wheezing, -hemoptysis,  Gi: Denies swallowing difficulty, stomach pain, nausea or vomiting, diarrhea, constipation, bowel incontinence Gu:  Denies bladder incontinence, burning urine Ext:   Denies Joint pain, stiffness or swelling Skin: Denies  skin rash, easy bruising or bleeding or hives Endoc:  Denies polyuria, polydipsia , polyphagia or weight change Psych:   Denies depression, insomnia or hallucinations  Other:  All other systems negative   ALL OTHER ROS ARE NEGATIVE  BP 126/74 (BP Location: Right Arm, Patient Position: Sitting, Cuff Size: Normal)   Pulse 70   Temp 97.8 F (36.6 C) (Temporal)   Ht 5' 6 (1.676 m)   Wt 201 lb (91.2 kg)   LMP 09/29/2015 Comment: upreg neg  SpO2 98%   BMI 32.44 kg/m     Physical Examination:   General Appearance: No distress  EYES PERRLA, EOM intact.   NECK Supple, No JVD Pulmonary: normal breath sounds, No wheezing.  CardiovascularNormal S1,S2.  No m/r/g.   Abdomen: Benign, Soft, non-tender. Skin:   warm, no rashes, no ecchymosis  Extremities: normal, no cyanosis, clubbing. Neuro:without focal findings,  speech normal  PSYCHIATRIC: Mood, affect within normal limits.   ALL OTHER ROS ARE NEGATIVE    ASSESSMENT AND PLAN SYNOPSIS  58 year old obese white female seen today for follow-up assessment for excessive daytime sleepiness fatigue tiredness  associated with history of thyroid  disease also with symptoms of progressive shortness of breath and dyspnea exertion over the last several years with significant secondhand smoking history and significant family history of lung cancer  Regarding assessment of OSA Patient does not have significant OSA that would require CPAP therapy  Recommend overnight pulse oximetry for further assessment for oxygen needs  Assessment of shortness of breath Recommend pulmonary function test  Shortness of breath with normal chest x-ray Recommend CT chest to assess for lung abnormalities  Follow-up with endocrinology Recommend weight loss   Obesity -recommend significant weight loss -recommend changing diet  Deconditioned state -Recommend increased daily activity and exercise     MEDICATION ADJUSTMENTS/LABS AND TESTS ORDERED: Recommend Pulmonary Function Testing to assess shortness of breath Recommend CT chest for shortness of breath Recommend overnight oxygen testing Recommend weight loss Avoid Allergens and Irritants Avoid secondhand smoke Avoid SICK contacts Recommend  Masking  when appropriate Recommend Keep up-to-date with vaccinations   CURRENT MEDICATIONS REVIEWED AT LENGTH WITH PATIENT TODAY   Patient  satisfied with Plan of action and management. All questions answered  Follow up  8 weeks  Total Time Spent  45 mins   Nickolas Alm Cellar, M.D.  [facility]  Medical Director [facility] Medical Director [facility] Cardio-Pulmonary Department

## 2024-01-04 NOTE — Patient Instructions (Addendum)
 Recommend Pulmonary Function Testing to assess shortness of breath  Recommend CT chest for shortness of breath  Recommend overnight oxygen testing  Recommend weight loss   Avoid Allergens and Irritants Avoid secondhand smoke Avoid SICK contacts Recommend  Masking  when appropriate Recommend Keep up-to-date with vaccinations  Follow up Thyroid  Doctors

## 2024-01-09 ENCOUNTER — Ambulatory Visit: Payer: Managed Care, Other (non HMO)

## 2024-01-11 ENCOUNTER — Ambulatory Visit
Admission: RE | Admit: 2024-01-11 | Discharge: 2024-01-11 | Disposition: A | Discharge status: Home / Self Care | Payer: Managed Care, Other (non HMO) | Source: Ambulatory Visit | Attending: Internal Medicine | Admitting: Internal Medicine

## 2024-01-11 DIAGNOSIS — R0600 Dyspnea, unspecified: Secondary | ICD-10-CM | POA: Diagnosis present

## 2024-01-17 ENCOUNTER — Encounter: Payer: Self-pay | Admitting: Internal Medicine

## 2024-01-17 DIAGNOSIS — G4734 Idiopathic sleep related nonobstructive alveolar hypoventilation: Secondary | ICD-10-CM

## 2024-01-24 NOTE — Telephone Encounter (Signed)
Dr. Belia Heman, ONO results have been scanned into her chart.

## 2024-01-26 NOTE — Addendum Note (Signed)
Addended by: Bonney Leitz on: 01/26/2024 03:08 PM   Modules accepted: Orders

## 2024-02-14 ENCOUNTER — Ambulatory Visit: Payer: Managed Care, Other (non HMO) | Admitting: Family Medicine

## 2024-02-14 ENCOUNTER — Encounter: Payer: Self-pay | Admitting: Family Medicine

## 2024-02-14 VITALS — BP 116/74 | HR 80 | Resp 16 | Ht 66.0 in | Wt 201.2 lb

## 2024-02-14 DIAGNOSIS — M48062 Spinal stenosis, lumbar region with neurogenic claudication: Secondary | ICD-10-CM

## 2024-02-14 DIAGNOSIS — E063 Autoimmune thyroiditis: Secondary | ICD-10-CM

## 2024-02-14 DIAGNOSIS — D8989 Other specified disorders involving the immune mechanism, not elsewhere classified: Secondary | ICD-10-CM

## 2024-02-14 DIAGNOSIS — R7303 Prediabetes: Secondary | ICD-10-CM

## 2024-02-14 DIAGNOSIS — M170 Bilateral primary osteoarthritis of knee: Secondary | ICD-10-CM

## 2024-02-14 DIAGNOSIS — M797 Fibromyalgia: Secondary | ICD-10-CM | POA: Diagnosis not present

## 2024-02-14 DIAGNOSIS — E559 Vitamin D deficiency, unspecified: Secondary | ICD-10-CM

## 2024-02-14 DIAGNOSIS — G9332 Myalgic encephalomyelitis/chronic fatigue syndrome: Secondary | ICD-10-CM

## 2024-02-14 DIAGNOSIS — G4734 Idiopathic sleep related nonobstructive alveolar hypoventilation: Secondary | ICD-10-CM

## 2024-02-14 MED ORDER — VITAMIN D (ERGOCALCIFEROL) 1.25 MG (50000 UNIT) PO CAPS: 50000.0000 [IU] | ORAL_CAPSULE | ORAL | 1 refills | Status: DC

## 2024-02-14 MED ORDER — PREGABALIN 75 MG PO CAPS: 75.0000 mg | ORAL_CAPSULE | Freq: Three times a day (TID) | ORAL | 1 refills | Status: DC

## 2024-02-14 MED ORDER — METFORMIN HCL ER 500 MG PO TB24: 500.0000 mg | ORAL_TABLET | Freq: Every day | ORAL | 1 refills | Status: DC

## 2024-02-14 NOTE — Progress Notes (Signed)
 Name: Monica Hardin   MRN: [id number]    DOB: 1966/05/20   Date:02/14/2024       Progress Note  Subjective  Chief Complaint  Chief Complaint  Patient presents with   Medical Management of Chronic Issues   HPI   FMS: she saw Dr. Gareth Morgan back in 05/2018 and was given formal diagnosis of FMS. She started on Duloxetine years ago we added Lyrica in January  2020 and she states the combination has been helping. She continues to have body aches intermittent but morning  stiffness is still daily but stable. She went to [facility] for a second opinion , she was seen by Dr. Azucena Fallen on 03/30/2021 Anti-DFS 70 Ab was positive. She states at thist time pain is mild 3-4/10 and stable  Nocturnal hypoxemia: mild OSA but does no qualify for CPAP, seen by Dr. Belia Heman . She also had a normal CT chest, she will also have a spirometry. She feels like she seems to be sleeping better, feeling more rested when she wakes up. Echo showed Grade I diastolic dysfunction and has an upcoming visit with Dr. Mariah Milling   Polyarthralgia: seen by Dr. Drusilla Kanner back in 2019 and ANA was high and also inflammatory markers, we have been following levels, she had a second opinion at [facility] 03/2021 saw Dr. Azucena Fallen , infammatory markers back to normal . She was given reassurance that pain was secondary to FMS not a auto immune disorder .Seeing Ortho for OA of knee  OA knee: seen by Ortho and had steroid injection 01/06 and is going back for hyaluronic acid injection this week.    Hypothyroidism/Hashimoto's thyroiditis: she has been on levothyroxine for years, TPO positive and she was seeing  by Dr. Tedd Sias but is now seeing a Hashimoto's thyroiditis sub-specialist in [place] and is now on levothyroxine and Cytomel, she has a visit with her on Monday - Dr. Carlena Sax   Pre-diabetes: last hgbA1C was up from 5.9 % to 6.2 %  , she has elevated liver enzymes. She has seen a dietician, is now on a gluten free diet and is surprised her level is trending  up. She states grandfather had DM . She is willing to try Metformin , discussed possible side effects   Dyslipidemia; not on medication, on life style modification only. She has very high LDL at 170, she just changed her diet and LDL is down to 109   Chronic fatigue Syndrome: she is able to work a full time job ( working from home now )  she had stop walking due to OA of knee but is doing some home PT   She states she still goes to bed early - between 9-9:30 and wakes up between 5:30 -6 am She states Trazodone helps her fall and stay asleep but had to increase dose to 100 mg and needs a refill . She tried provigil but could not tolerate it in the past .    Neurogenic claudication : she developed low back pain over the past since Spring 2023, finally seen here and had abnormal X-ray lumbar spine, referred neurosurgeon and MRI showed findings below , she was referred PMR and had steroid injections Spring 2024 and also had an ablation. She is now taking Lyrica  one in am, one in pm and two at night and is helping with symptoms . It also helping with RLS   IMPRESSIO 10/29/2022 : 1. Mild bilateral facet arthropathy at L4-5 and L5-S1. 2. No significant lumbar spine disc protrusion, foraminal  stenosis or central canal stenosis. 3. No acute osseous injury of the lumbar spine.    Patient Active Problem List   Diagnosis Date Noted   Neurogenic claudication due to lumbar spinal stenosis 12/07/2022   Varicose veins of both lower extremities 02/27/2019   Primary osteoarthritis of both hands 06/15/2018   Primary osteoarthritis of both knees 06/15/2018   Primary osteoarthritis of both feet 06/15/2018   Positive ANA (antinuclear antibody) 06/15/2018   Fibromyalgia 06/15/2018   Encounter for screening colonoscopy 11/07/2016   Surgical menopause on hormone replacement therapy 09/01/2016   Vitamin D deficiency 04/20/2016   Low vitamin B12 level 04/20/2016   History of uterine leiomyoma 04/19/2016    Sciatica, right side 04/19/2016   Status post TAH-BSO 11/12/2015   History of ovarian cyst 11/12/2015   Dyslipidemia 07/04/2015   Adult hypothyroidism 07/04/2015   Excess weight 07/04/2015   Disturbance of skin sensation 07/04/2015   Lipoma of arm 12/03/2014   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    Past Surgical History:  Procedure Laterality Date   ABDOMINAL HYSTERECTOMY N/A 10/05/2015   Procedure: HYSTERECTOMY ABDOMINAL;  Surgeon: Herold Harms, MD;  Location: [facility] ORS;  Service: Gynecology;  Laterality: N/A;   CHOLECYSTECTOMY N/A 03/27/2017   Procedure: LAPAROSCOPIC CHOLECYSTECTOMY;  Surgeon: Henrene Dodge, MD;  Location: [facility] ORS;  Service: General;  Laterality: N/A;   COLONOSCOPY WITH PROPOFOL N/A 11/22/2016   Procedure: COLONOSCOPY WITH PROPOFOL;  Surgeon: Earline Mayotte, MD;  Location: [facility] END[facility]COPY;  Service: Endoscopy;  Laterality: N/A;   FRACTURE SURGERY     LAPAROSCOPY N/A 08/24/2015   Procedure: LAPAROSCOPY DIAGNOSTIC;  Surgeon: Herold Harms, MD;  Location: [facility] ORS;  Service: Gynecology;  Laterality: N/A;   LEEP  [id number]   [facility]   MANDIBLE FRACTURE SURGERY  1996   SALPINGOOPHORECTOMY Bilateral 10/05/2015   Procedure: SALPINGO OOPHORECTOMY;  Surgeon: Herold Harms, MD;  Location: [facility] ORS;  Service: Gynecology;  Laterality: Bilateral;    Family History  Problem Relation Age of Onset   Lung cancer Mother    Cancer Mother    Lung cancer Father    Cancer Father    Kidney Stones Daughter    Asthma Son    Breast cancer Paternal Aunt 50   Diabetes Paternal Grandmother    Thyroid disease Sister     Social History   Tobacco Use   Smoking status: Never   Smokeless tobacco: Never  Substance Use Topics   Alcohol use: Never     Current Outpatient Medications:    Cyanocobalamin (B-12) 1000 MCG SUBL, Place 1 tablet under the tongue daily., Disp: 100 tablet, Rfl: 1   DULoxetine (CYMBALTA) 60 MG capsule, Take  1 capsule (60 mg total) by mouth daily., Disp: 90 capsule, Rfl: 1   levothyroxine (SYNTHROID) 100 MCG tablet, Take 100 mcg by mouth daily., Disp: , Rfl:    liothyronine (CYTOMEL) 5 MCG tablet, Take 5 mcg by mouth daily., Disp: , Rfl:    Magnesium Glycinate 100 MG CAPS, Take 1 capsule by mouth daily at 12 noon., Disp: 100 capsule, Rfl: 1   pregabalin (LYRICA) 75 MG capsule, Take 1-2 capsules (75-150 mg total) by mouth 3 (three) times daily. 1 in am, 1 in pm and two at bedtime, Disp: 360 capsule, Rfl: 1   traZODone (DESYREL) 100 MG tablet, Take 1 tablet (100 mg total) by mouth at bedtime., Disp: 90 tablet, Rfl: 1   Vitamin D, Ergocalciferol, (DRISDOL) 1.25 MG (50000 UNIT) CAPS  capsule, Take 1 capsule (50,000 Units total) by mouth once a week., Disp: 12 capsule, Rfl: 1  Allergies  Allergen Reactions   Statins     myalgia    I personally reviewed active problem list, medication list, allergies with the patient/caregiver today.   ROS  Ten systems reviewed and is negative except as mentioned in HPI    Objective  Vitals:   02/14/24 0752  BP: 116/74  Pulse: 80  Resp: 16  Weight: 201 lb 3.2 oz (91.3 kg)  Height: 5\' 6"  (1.676 m)    Body mass index is 32.47 kg/m.  Physical Exam  Constitutional: Patient appears well-developed and well-nourished. Obese  No distress.  HEENT: head atraumatic, normocephalic, pupils equal and reactive to light, neck supple Cardiovascular: Normal rate, regular rhythm and normal heart sounds.  No murmur heard. No BLE edema. Pulmonary/Chest: Effort normal and breath sounds normal. No respiratory distress. Abdominal: Soft.  There is no tenderness. Psychiatric: Patient has a normal mood and affect. behavior is normal. Judgment and thought content normal.   Recent Results (from the past 2160 hours)  ECHOCARDIOGRAM COMPLETE     Status: None   Collection Time: 12/13/23 11:28 AM  Result Value Ref Range   Ao pk vel 0.90 m/s   AV Area VTI 3.07 cm2   AR max vel  2.98 cm2   AV Mean grad 2.0 mmHg   AV Peak grad 3.2 mmHg   S' Lateral 2.60 cm   AV Area mean vel 3.17 cm2   Area-P 1/2 4.29 cm2   MV VTI 2.39 cm2   Est EF 55 - 60%   Basic metabolic panel     Status: None   Collection Time: 12/29/23 11:39 AM  Result Value Ref Range   Sodium 137 135 - 145 mmol/L   Potassium 3.5 3.5 - 5.1 mmol/L   Chloride 102 98 - 111 mmol/L   CO2 25 22 - 32 mmol/L   Glucose, Bld 97 70 - 99 mg/dL    Comment: Glucose reference range applies only to samples taken after fasting for at least 8 hours.   BUN 17 6 - 20 mg/dL   Creatinine, Ser 2.13 0.44 - 1.00 mg/dL   Calcium 8.9 8.9 - 08.6 mg/dL   GFR, Estimated >57 >84 mL/min    Comment: (NOTE) Calculated using the CKD-EPI Creatinine Equation (2021)    Anion gap 10 5 - 15    Comment: Performed at [facility], [address] [place], [place] [address]  CBC     Status: None   Collection Time: 12/29/23 11:39 AM  Result Value Ref Range   WBC 5.5 4.0 - 10.5 K/uL   RBC 4.64 3.87 - 5.11 MIL/uL   Hemoglobin 14.3 12.0 - 15.0 g/dL   HCT 52.8 41.3 - 24.4 %   MCV 93.8 80.0 - 100.0 fL   MCH 30.8 26.0 - 34.0 pg   MCHC 32.9 30.0 - 36.0 g/dL   RDW 01.0 27.2 - 53.6 %   Platelets 270 150 - 400 K/uL   nRBC 0.0 0.0 - 0.2 %    Comment: Performed at [facility], [address] [place], [place] [address]  Troponin I (High Sensitivity)     Status: None   Collection Time: 12/29/23 11:39 AM  Result Value Ref Range   Troponin I (High Sensitivity) 2 <18 ng/L    Comment: (NOTE) Elevated high sensitivity troponin I (hsTnI) values and significant  changes across serial measurements may suggest ACS  but many other  chronic and acute conditions are known to elevate hsTnI results.  Refer to the "Links" section for chest pain algorithms and additional  guidance. Performed at [facility], [address] [place], [place] [address]   Brain natriuretic peptide     Status: None   Collection Time: 12/29/23  11:39 AM  Result Value Ref Range   B Natriuretic Peptide 13.8 0.0 - 100.0 pg/mL    Comment: Performed at [facility], [address] [place], [place] [address]  Troponin I (High Sensitivity)     Status: None   Collection Time: 12/29/23  3:00 PM  Result Value Ref Range   Troponin I (High Sensitivity) 3 <18 ng/L    Comment: (NOTE) Elevated high sensitivity troponin I (hsTnI) values and significant  changes across serial measurements may suggest ACS but many other  chronic and acute conditions are known to elevate hsTnI results.  Refer to the "Links" section for chest pain algorithms and additional  guidance. Performed at [facility], [address] [place], [place] [address]   D-dimer, quantitative     Status: None   Collection Time: 12/29/23  3:00 PM  Result Value Ref Range   D-Dimer, Quant 0.48 0.00 - 0.50 ug/mL-FEU    Comment: (NOTE) At the manufacturer cut-off value of 0.5 g/mL FEU, this assay has a negative predictive value of 95-100%.This assay is intended for use in conjunction with a clinical pretest probability (PTP) assessment model to exclude pulmonary embolism (PE) and deep venous thrombosis (DVT) in outpatients suspected of PE or DVT. Results should be correlated with clinical presentation. Performed at [facility], [address] [place], [place] [address]   Resp panel by RT-PCR (RSV, Flu A&B, Covid) Anterior Nasal Swab     Status: None   Collection Time: 12/29/23  3:00 PM   Specimen: Anterior Nasal Swab  Result Value Ref Range   SARS Coronavirus 2 by RT PCR NEGATIVE NEGATIVE    Comment: (NOTE) SARS-CoV-2 target nucleic acids are NOT DETECTED.  The SARS-CoV-2 RNA is generally detectable in upper respiratory specimens during the acute phase of infection. The lowest concentration of SARS-CoV-2 viral copies this assay can detect is 138 copies/mL. A negative result does not preclude SARS-Cov-2 infection and should not be used as  the sole basis for treatment or other patient management decisions. A negative result may occur with  improper specimen collection/handling, submission of specimen other than nasopharyngeal swab, presence of viral mutation(s) within the areas targeted by this assay, and inadequate number of viral copies(<138 copies/mL). A negative result must be combined with clinical observations, patient history, and epidemiological information. The expected result is Negative.  Fact Sheet for Patients:  BloggerCourse.com  Fact Sheet for Healthcare Providers:  SeriousBroker.it  This test is no t yet approved or cleared by the [place] FDA and  has been authorized for detection and/or diagnosis of SARS-CoV-2 by FDA under an Emergency Use Authorization (EUA). This EUA will remain  in effect (meaning this test can be used) for the duration of the COVID-19 declaration under Section 564(b)(1) of the Act, 21 U.S.C.section 360bbb-3(b)(1), unless the authorization is terminated  or revoked sooner.       Influenza A by PCR NEGATIVE NEGATIVE   Influenza B by PCR NEGATIVE NEGATIVE    Comment: (NOTE) The Xpert Xpress SARS-CoV-2/FLU/RSV plus assay is intended as an aid in the diagnosis of influenza from Nasopharyngeal swab specimens and should not be used as a  sole basis for treatment. Nasal washings and aspirates are unacceptable for Xpert Xpress SARS-CoV-2/FLU/RSV testing.  Fact Sheet for Patients: BloggerCourse.com  Fact Sheet for Healthcare Providers: SeriousBroker.it  This test is not yet approved or cleared by the [place] FDA and has been authorized for detection and/or diagnosis of SARS-CoV-2 by FDA under an Emergency Use Authorization (EUA). This EUA will remain in effect (meaning this test can be used) for the duration of the COVID-19 declaration under Section 564(b)(1) of the Act, 21  U.S.C. section 360bbb-3(b)(1), unless the authorization is terminated or revoked.     Resp Syncytial Virus by PCR NEGATIVE NEGATIVE    Comment: (NOTE) Fact Sheet for Patients: BloggerCourse.com  Fact Sheet for Healthcare Providers: SeriousBroker.it  This test is not yet approved or cleared by the [place] FDA and has been authorized for detection and/or diagnosis of SARS-CoV-2 by FDA under an Emergency Use Authorization (EUA). This EUA will remain in effect (meaning this test can be used) for the duration of the COVID-19 declaration under Section 564(b)(1) of the Act, 21 U.S.C. section 360bbb-3(b)(1), unless the authorization is terminated or revoked.  Performed at [facility], [address] [place], [place] [address]     Diabetic Foot Exam:     PHQ2/9:    02/14/2024    7:51 AM 12/12/2023    8:15 AM 11/13/2023    9:30 AM 06/09/2023    9:34 AM 05/08/2023    1:08 PM  Depression screen PHQ 2/9  Decreased Interest 0 1 1 0 0  Down, Depressed, Hopeless 0 1 1 0 2  PHQ - 2 Score 0 2 2 0 2  Altered sleeping 0 1 1 0 2  Tired, decreased energy 0 1 3 3 3   Change in appetite 0 0 0 3 0  Feeling bad or failure about yourself  0 0 1 0 0  Trouble concentrating 0 1 0 0 0  Moving slowly or fidgety/restless 0 0 0 0 0  Suicidal thoughts 0 0 0 0 0  PHQ-9 Score 0 5 7 6 7   Difficult doing work/chores Not difficult at all Somewhat difficult Somewhat difficult      phq 9 is negative  Fall Risk:    02/14/2024    7:46 AM 11/13/2023    9:30 AM 06/09/2023    9:34 AM 05/08/2023    1:08 PM 01/23/2023   10:22 AM  Fall Risk   Falls in the past year? 0 0 0 0 0  Number falls in past yr: 0  0 0 0  Injury with Fall? 0  0 0 0  Risk for fall due to : No Fall Risks No Fall Risks No Fall Risks No Fall Risks   Follow up Falls prevention discussed;Education provided;Falls evaluation completed Falls prevention discussed Falls  prevention discussed Falls prevention discussed Falls prevention discussed;Education provided;Falls evaluation completed     Assessment & Plan  1. CFIDS (chronic fatigue and immune dysfunction syndrome) (HCC) (Primary)  Feeling slightly better  2. Fibromyalgia  - pregabalin (LYRICA) 75 MG capsule; Take 1-2 capsules (75-150 mg total) by mouth 3 (three) times daily. 1 in am, 1 in pm and two at bedtime  Dispense: 360 capsule; Refill: 1  3. Prediabetes  - Hemoglobin A1c  4. Neurogenic claudication due to lumbar spinal stenosis  Doing better, taking lyrica   5. Vitamin D deficiency  - Vitamin D, Ergocalciferol, (DRISDOL) 1.25 MG (50000 UNIT) CAPS capsule; Take 1 capsule (50,000 Units total) by mouth once  a week.  Dispense: 12 capsule; Refill: 1  6. Hypothyroidism due to Hashimoto's thyroiditis  Taking medications, managed by Endo  7. Nocturnal hypoxemia  Using oxygen at night  8. Hashimoto's thyroiditis  Seeing sub-specialist   9. Primary osteoarthritis of both knees  Under the care of Ortho

## 2024-02-15 LAB — HEMOGLOBIN A1C
Est. average glucose Bld gHb Est-mCnc: 131 mg/dL
Hgb A1c MFr Bld: 6.2 % — ABNORMAL HIGH (ref 4.8–5.6)

## 2024-02-16 ENCOUNTER — Other Ambulatory Visit
Admission: RE | Admit: 2024-02-16 | Discharge: 2024-02-16 | Disposition: A | Discharge status: Home / Self Care | Payer: Managed Care, Other (non HMO) | Source: Ambulatory Visit | Attending: Student | Admitting: Student

## 2024-02-16 ENCOUNTER — Encounter: Payer: Self-pay | Admitting: Family Medicine

## 2024-02-16 DIAGNOSIS — M1711 Unilateral primary osteoarthritis, right knee: Secondary | ICD-10-CM | POA: Insufficient documentation

## 2024-02-16 DIAGNOSIS — M170 Bilateral primary osteoarthritis of knee: Secondary | ICD-10-CM | POA: Diagnosis present

## 2024-02-16 LAB — SYNOVIAL CELL COUNT + DIFF, W/ CRYSTALS
Crystals, Fluid: NONE SEEN
Eosinophils-Synovial: 0 %
Lymphocytes-Synovial Fld: 52 %
Monocyte-Macrophage-Synovial Fluid: 15 %
Neutrophil, Synovial: 33 %
WBC, Synovial: 409 /mm3 — ABNORMAL HIGH (ref 0–200)

## 2024-02-26 ENCOUNTER — Telehealth: Payer: Self-pay | Admitting: *Deleted

## 2024-02-26 NOTE — Telephone Encounter (Signed)
 LMOVM to verify card hx.

## 2024-02-27 ENCOUNTER — Other Ambulatory Visit
Admission: RE | Admit: 2024-02-27 | Discharge: 2024-02-27 | Disposition: A | Discharge status: Home / Self Care | Source: Ambulatory Visit | Attending: Student | Admitting: Student

## 2024-02-27 ENCOUNTER — Ambulatory Visit: Payer: Managed Care, Other (non HMO)

## 2024-02-27 ENCOUNTER — Ambulatory Visit: Payer: Managed Care, Other (non HMO) | Admitting: Gastroenterology

## 2024-02-27 DIAGNOSIS — R0600 Dyspnea, unspecified: Secondary | ICD-10-CM | POA: Diagnosis present

## 2024-02-27 DIAGNOSIS — Z23 Encounter for immunization: Secondary | ICD-10-CM | POA: Diagnosis not present

## 2024-02-27 DIAGNOSIS — M25561 Pain in right knee: Secondary | ICD-10-CM | POA: Diagnosis present

## 2024-02-27 HISTORY — DX: Dyspnea, unspecified: R06.00

## 2024-02-27 LAB — PULMONARY FUNCTION TEST ARMC ONLY
DL/VA % pred: 96 %
DL/VA: 4.02 ml/min/mmHg/L
DLCO unc % pred: 75 %
DLCO unc: 16.41 ml/min/mmHg
FEF 25-75 Post: 2.49 L/s
FEF 25-75 Pre: 1.4 L/s
FEF2575-%Change-Post: 78 %
FEF2575-%Pred-Post: 97 %
FEF2575-%Pred-Pre: 54 %
FEV1-%Change-Post: 44 %
FEV1-%Pred-Post: 79 %
FEV1-%Pred-Pre: 54 %
FEV1-Post: 2.23 L
FEV1-Pre: 1.54 L
FEV1FVC-%Change-Post: 17 %
FEV1FVC-%Pred-Pre: 86 %
FEV6-%Change-Post: 22 %
FEV6-%Pred-Post: 79 %
FEV6-%Pred-Pre: 64 %
FEV6-Post: 2.79 L
FEV6-Pre: 2.27 L
FEV6FVC-%Pred-Post: 103 %
FEV6FVC-%Pred-Pre: 103 %
FVC-%Change-Post: 22 %
FVC-%Pred-Post: 77 %
FVC-%Pred-Pre: 62 %
FVC-Post: 2.79 L
FVC-Pre: 2.27 L
Post FEV1/FVC ratio: 80 %
Post FEV6/FVC ratio: 100 %
Pre FEV1/FVC ratio: 68 %
Pre FEV6/FVC Ratio: 100 %
RV % pred: 95 %
RV: 1.95 L
TLC % pred: 72 %
TLC: 3.87 L

## 2024-02-27 LAB — SYNOVIAL CELL COUNT + DIFF, W/ CRYSTALS
Crystals, Fluid: NONE SEEN
Eosinophils-Synovial: 0 %
Lymphocytes-Synovial Fld: 37 %
Monocyte-Macrophage-Synovial Fluid: 48 %
Neutrophil, Synovial: 15 %
WBC, Synovial: 913 /mm3 — ABNORMAL HIGH (ref 0–200)

## 2024-02-27 MED ORDER — ALBUTEROL SULFATE (2.5 MG/3ML) 0.083% IN NEBU
2.5000 mg | INHALATION_SOLUTION | Freq: Once | RESPIRATORY_TRACT | Status: AC
  Administered 2024-02-27: 2.5 mg via RESPIRATORY_TRACT
  Filled 2024-02-27: qty 3

## 2024-02-29 ENCOUNTER — Ambulatory Visit: Payer: Managed Care, Other (non HMO) | Admitting: Internal Medicine

## 2024-02-29 ENCOUNTER — Encounter: Payer: Self-pay | Admitting: Internal Medicine

## 2024-02-29 VITALS — BP 132/88 | HR 76 | Temp 97.6°F | Ht 66.0 in | Wt 201.4 lb

## 2024-02-29 DIAGNOSIS — J9611 Chronic respiratory failure with hypoxia: Secondary | ICD-10-CM

## 2024-02-29 DIAGNOSIS — J4540 Moderate persistent asthma, uncomplicated: Secondary | ICD-10-CM | POA: Diagnosis not present

## 2024-02-29 DIAGNOSIS — R0602 Shortness of breath: Secondary | ICD-10-CM

## 2024-02-29 LAB — POCT EXHALED NITRIC OXIDE: FeNO level (ppb): 20

## 2024-02-29 MED ORDER — BUDESONIDE-FORMOTEROL FUMARATE 160-4.5 MCG/ACT IN AERO: 2.0000 | INHALATION_SPRAY | Freq: Two times a day (BID) | RESPIRATORY_TRACT | 12 refills | Status: AC

## 2024-02-29 NOTE — Progress Notes (Signed)
 Name: Monica Hardin MRN: [id number] DOB: Oct 16, 1966    CHIEF COMPLAINT:  Assessment for shortness of breath   HISTORY OF PRESENT ILLNESS: Follow-up assessment for shortness of breath Patient has significant secondhand smoking history for approximately 25 years of her life Patient has increased shortness of breath over the last several years Progressively worsening over the last several months clinical symptoms correlate with moderate persistent ASTHMA   No evidence of heart failure at this time No evidence or signs of infection at this time No respiratory distress No fevers, chills, nausea, vomiting, diarrhea No evidence of lower extremity edema No evidence hemoptysis  Pulmonary function testing February 27, 2024 Reviewed in detail with patient FEV1 FVC ratio postbronchodilator is 80% predicted FEV1 79% predicted Significant bronchodilator response FVC is 62% predicted TLC was 72% predicted Findings to suggest some restrictive lung disease No significant abnormality in the DLCO Flow-volume loops showed low lung volumes  PSG 08/2023 did NOT show significant AHI which was 2.4 Overnight pulse oximetry did not show any type of significant hypoxia Overnight pulse oximetry ordered-patient now on oxygen therapy, just barely met the criteria Advised to continue oxygen as prescribed  FENO in office recorded 20   PAST MEDICAL HISTORY :   has a past medical history of Cancer (HCC), Chronic fatigue, Hyperlipidemia, Hypothyroidism, Overweight, Primary osteoarthritis of both hands (06/15/2018), Sciatica, right side, and Thyroid disease.  has a past surgical history that includes LEEP (4098-1191); Mandible fracture surgery (1996); laparoscopy (N/A, 08/24/2015); Fracture surgery; Abdominal hysterectomy (N/A, 10/05/2015); Salpingoophorectomy (Bilateral, 10/05/2015); Colonoscopy with propofol (N/A, 11/22/2016); and Cholecystectomy (N/A, 03/27/2017). Prior to Admission medications   Medication  Sig Start Date End Date Taking? Authorizing Provider  Cyanocobalamin (B-12) 1000 MCG SUBL Place 1 tablet under the tongue daily. 02/27/23   Alba Cory, MD  DULoxetine (CYMBALTA) 60 MG capsule Take 1 capsule (60 mg total) by mouth daily. 11/13/23   Alba Cory, MD  levothyroxine (SYNTHROID) 100 MCG tablet Take 100 mcg by mouth daily. 06/19/23   [provider]  liothyronine (CYTOMEL) 5 MCG tablet Take 5 mcg by mouth daily. 06/19/23   [provider]  Magnesium Glycinate 100 MG CAPS Take 1 capsule by mouth daily at 12 noon. 02/28/23   Alba Cory, MD  pregabalin (LYRICA) 75 MG capsule Take 1-2 capsules (75-150 mg total) by mouth 3 (three) times daily. 1 in am, 1 in pm and two at bedtime 11/13/23   Alba Cory, MD  traZODone (DESYREL) 100 MG tablet Take 1 tablet (100 mg total) by mouth at bedtime. 11/13/23   Alba Cory, MD  Vitamin D, Ergocalciferol, (DRISDOL) 1.25 MG (50000 UNIT) CAPS capsule Take 1 capsule (50,000 Units total) by mouth once a week. 06/09/23   Alba Cory, MD   Allergies  Allergen Reactions   Statins     myalgia    FAMILY HISTORY:  family history includes Asthma in her son; Breast cancer (age of onset: 79) in her paternal aunt; Cancer in her father and mother; Diabetes in her paternal grandmother; Kidney Stones in her daughter; Lung cancer in her father and mother; Thyroid disease in her sister. SOCIAL HISTORY:  reports that she has never smoked. She has never used smokeless tobacco. She reports that she does not drink alcohol and does not use drugs.  BP 132/88 (BP Location: Left Arm, Patient Position: Sitting, Cuff Size: Normal)   Pulse 76   Temp 97.6 F (36.4 C) (Temporal)   Ht 5\' 6"  (1.676 m)   Wt 201  lb 6.4 oz (91.4 kg)   LMP 09/29/2015 Comment: upreg neg  SpO2 100%   BMI 32.51 kg/m     Review of Systems: Gen:  Denies  fever, sweats, chills weight loss  HEENT: Denies blurred vision, double vision, ear pain, eye pain, hearing  loss, nose bleeds, sore throat Cardiac:  No dizziness, chest pain or heaviness, chest tightness,edema, No JVD Resp:   +cough, -sputum production, +shortness of breath,+wheezing, -hemoptysis,  Other:  All other systems negative   Physical Examination:   General Appearance: No distress  EYES PERRLA, EOM intact.   NECK Supple, No JVD Pulmonary: normal breath sounds, No wheezing.  CardiovascularNormal S1,S2.  No m/r/g.   Abdomen: Benign, Soft, non-tender. Neurology UE/LE 5/5 strength, no focal deficits Ext pulses intact, cap refill intact ALL OTHER ROS ARE NEGATIVE      ASSESSMENT AND PLAN SYNOPSIS  58 year old obese white female seen today for follow-up  symptoms of progressive shortness of breath and dyspnea exertion over the last several years with significant secondhand smoking history and significant family history of lung cancer, findings consistent with reactive airways disease with positive bronchodilator response likely asthmatic bronchitis, Moderate persistent ASTHMA   Assessment of shortness of breath Likely diagnosed ASTHMA Recommend initiation of inhaler therapy Will start Symbicort Inhalers   Regarding assessment of OSA Patient does not have significant OSA that would require CPAP therapy  Chronic Hypoxic resp failure due to COPD -Patient benefits from oxygen therapy 2L [place]  -recommend using oxygen as prescribed We will repeat this test once ASTHMA symptoms under control  Shortness of breath with normal chest x-ray Previous CT chest did not show significant abnormalities  Obesity -recommend significant weight loss -recommend changing diet  Deconditioned state -Recommend increased daily activity and exercise  Follow-up with endocrinology Recommend weight loss   MEDICATION ADJUSTMENTS/LABS AND TESTS ORDERED: Start Symbicort Inhaler 2 puffs in AM and 2 puffs in PM Please rinse mouth after every use Continue oxygen as prescribed Recommend weight  loss Avoid Allergens and Irritants Avoid secondhand smoke Avoid SICK contacts Recommend  Masking  when appropriate Recommend Keep up-to-date with vaccinations   CURRENT MEDICATIONS REVIEWED AT LENGTH WITH PATIENT TODAY   Patient  satisfied with Plan of action and management. All questions answered  Follow up 8 weeks  Total time spent 42 minutes   Lucie Leather, M.D.  [facility]  Medical Director [facility] Medical Director [facility] Cardio-Pulmonary Department

## 2024-02-29 NOTE — Patient Instructions (Addendum)
 Start Symbicort Inhaler 2 puffs in AM and 2 puffs in PM Please rinse mouth after every use Continue oxygen as prescribed Avoid Allergens and Irritants Avoid secondhand smoke Avoid SICK contacts Recommend  Masking  when appropriate Recommend Keep up-to-date with vaccinations

## 2024-03-01 ENCOUNTER — Other Ambulatory Visit: Payer: Self-pay | Admitting: Student

## 2024-03-01 ENCOUNTER — Ambulatory Visit: Payer: Managed Care, Other (non HMO) | Attending: Cardiovascular Disease | Admitting: Cardiovascular Disease

## 2024-03-01 ENCOUNTER — Encounter: Payer: Self-pay | Admitting: Cardiovascular Disease

## 2024-03-01 VITALS — BP 112/72 | HR 75 | Ht 66.0 in | Wt 200.0 lb

## 2024-03-01 DIAGNOSIS — M25461 Effusion, right knee: Secondary | ICD-10-CM

## 2024-03-01 DIAGNOSIS — R0602 Shortness of breath: Secondary | ICD-10-CM

## 2024-03-01 DIAGNOSIS — M25561 Pain in right knee: Secondary | ICD-10-CM

## 2024-03-01 DIAGNOSIS — G4734 Idiopathic sleep related nonobstructive alveolar hypoventilation: Secondary | ICD-10-CM

## 2024-03-01 DIAGNOSIS — R062 Wheezing: Secondary | ICD-10-CM | POA: Diagnosis not present

## 2024-03-01 DIAGNOSIS — M1711 Unilateral primary osteoarthritis, right knee: Secondary | ICD-10-CM

## 2024-03-01 NOTE — Progress Notes (Signed)
 Cardiology Office Note  Date:  03/01/2024   ID:  Monica Hardin, DOB [birth year], MRN [id number]  PCP:  Alba Cory, MD   Chief Complaint  Patient presents with   New Patient (Initial Visit)    Ref by Dr. Carlynn Purl for Echo showed grade I diastolic dysfunction. Patient c/o chest discomfort and shortness of breath.     HPI:  Monica Hardin is a 58 year old woman with past medical history of asthma Obese the Who presents by referral from Dr. Carlynn Purl for consultation of her shortness of breath  On discussion today, reports having some shortness of breath on exertion Activity  like walking into the office had some shortness of breath Occasional funny feeling in chest, hard to describe  No exercise 4 months secondary to chronic knee pain Thinks that she will need knee surgery in the near future  Recent cardiac studies reviewed with her in detail Echocardiogram December 13, 2023 essentially normal study  CT scan chest January.  16, 2025 Significant coronary calcification or aortic atherosclerosis  EKG personally reviewed by myself on todays visit EKG Interpretation Date/Time:  Friday March 01 2024 09:48:00 EST Ventricular Rate:  75 PR Interval:  180 QRS Duration:  82 QT Interval:  382 QTC Calculation: 426 R Axis:   26  Text Interpretation: Normal sinus rhythm Normal ECG When compared with ECG of 29-Dec-2023 11:39, No significant change was found Confirmed by Julien Nordmann [phone]) on 03/01/2024 9:57:26 AM     PMH:   has a past medical history of Cancer (HCC), Chronic fatigue, Hyperlipidemia, Hypothyroidism, Overweight, Primary osteoarthritis of both hands (06/15/2018), Sciatica, right side, and Thyroid disease.  PSH:    Past Surgical History:  Procedure Laterality Date   ABDOMINAL HYSTERECTOMY N/A 10/05/2015   Procedure: HYSTERECTOMY ABDOMINAL;  Surgeon: Herold Harms, MD;  Location: [facility];  Service: Gynecology;  Laterality: N/A;   CHOLECYSTECTOMY N/A 03/27/2017    Procedure: LAPAROSCOPIC CHOLECYSTECTOMY;  Surgeon: Henrene Dodge, MD;  Location: [facility];  Service: General;  Laterality: N/A;   COLONOSCOPY WITH PROPOFOL N/A 11/22/2016   Procedure: COLONOSCOPY WITH PROPOFOL;  Surgeon: Earline Mayotte, MD;  Location: [facility];  Service: Endoscopy;  Laterality: N/A;   FRACTURE SURGERY     LAPAROSCOPY N/A 08/24/2015   Procedure: LAPAROSCOPY DIAGNOSTIC;  Surgeon: Herold Harms, MD;  Location: [facility];  Service: Gynecology;  Laterality: N/A;   LEEP  [id number]   [facility]   MANDIBLE FRACTURE SURGERY  1996   SALPINGOOPHORECTOMY Bilateral 10/05/2015   Procedure: SALPINGO OOPHORECTOMY;  Surgeon: Herold Harms, MD;  Location: [facility];  Service: Gynecology;  Laterality: Bilateral;    Current Outpatient Medications  Medication Sig Dispense Refill   budesonide-formoterol (SYMBICORT) 160-4.5 MCG/ACT inhaler Inhale 2 puffs into the lungs 2 (two) times daily. 1 each 12   Cyanocobalamin (B-12) 1000 MCG SUBL Place 1 tablet under the tongue daily. 100 tablet 1   DULoxetine (CYMBALTA) 60 MG capsule Take 1 capsule (60 mg total) by mouth daily. 90 capsule 1   levothyroxine (SYNTHROID) 100 MCG tablet Take 100 mcg by mouth daily.     liothyronine (CYTOMEL) 5 MCG tablet Take 5 mcg by mouth daily.     Magnesium Glycinate 100 MG CAPS Take 1 capsule by mouth daily at 12 noon. 100 capsule 1   metFORMIN (GLUCOPHAGE-XR) 500 MG 24 hr tablet Take 1 tablet (500 mg total) by mouth daily with breakfast. 90 tablet 1   pregabalin (LYRICA) 75 MG capsule Take 1-2 capsules (75-150 mg  total) by mouth 3 (three) times daily. 1 in am, 1 in pm and two at bedtime 360 capsule 1   traZODone (DESYREL) 100 MG tablet Take 1 tablet (100 mg total) by mouth at bedtime. 90 tablet 1   Vitamin D, Ergocalciferol, (DRISDOL) 1.25 MG (50000 UNIT) CAPS capsule Take 1 capsule (50,000 Units total) by mouth once a week. 12 capsule 1   No current facility-administered medications for this visit.      Allergies:   Statins   Social History:  The patient  reports that she has never smoked. She has never used smokeless tobacco. She reports that she does not drink alcohol and does not use drugs.   Family History:   family history includes Asthma in her son; Breast cancer (age of onset: 76) in her paternal aunt; Cancer in her father and mother; Diabetes in her paternal grandmother; Kidney Stones in her daughter; Lung cancer in her father and mother; Thyroid disease in her sister.    Review of Systems: Review of Systems  Constitutional: Negative.   HENT: Negative.    Respiratory: Negative.    Cardiovascular: Negative.   Gastrointestinal: Negative.   Musculoskeletal: Negative.   Neurological: Negative.   Psychiatric/Behavioral: Negative.    All other systems reviewed and are negative.    PHYSICAL EXAM: VS:  BP 112/72 (BP Location: Right Arm, Patient Position: Sitting, Cuff Size: Normal)   Pulse 75   Ht 5\' 6"  (1.676 m)   Wt 200 lb (90.7 kg)   LMP 09/29/2015 Comment: upreg neg  SpO2 95%   BMI 32.28 kg/m  , BMI Body mass index is 32.28 kg/m. GEN: Well nourished, well developed, in no acute distress HEENT: normal Neck: no JVD, carotid bruits, or masses Cardiac: RRR; no murmurs, rubs, or gallops,no edema  Respiratory:  clear to auscultation bilaterally, normal work of breathing GI: soft, nontender, nondistended, + BS MS: no deformity or atrophy Skin: warm and dry, no rash Neuro:  Strength and sensation are intact Psych: euthymic mood, full affect   Recent Labs: 11/13/2023: ALT 33 12/29/2023: B Natriuretic Peptide 13.8; BUN 17; Creatinine, Ser 0.93; Hemoglobin 14.3; Platelets 270; Potassium 3.5; Sodium 137    Lipid Panel Lab Results  Component Value Date   CHOL 201 (H) 11/13/2023   HDL 45 11/13/2023   LDLCALC 109 (H) 11/13/2023   TRIG 271 (H) 11/13/2023      Wt Readings from Last 3 Encounters:  03/01/24 200 lb (90.7 kg)  02/29/24 201 lb 6.4 oz (91.4 kg)   02/14/24 201 lb 3.2 oz (91.3 kg)     ASSESSMENT AND PLAN:  Problem List Items Addressed This Visit   None Visit Diagnoses       SOB (shortness of breath)    -  Primary   Relevant Orders   EKG 12-Lead (Completed)     Nocturnal hypoxemia       Relevant Orders   EKG 12-Lead (Completed)     Wheezing           Shortness of breath Echocardiogram reviewed, no significant findings noted Grade 1 diastolic dysfunction likely benign process No indication of pulmonary hypertension, no significant valvular disease Chest CT scan with no aortic atherosclerosis, no coronary calcification No strong indication for further ischemic workup Appears euvolemic Has been sedentary for 4 months Cholesterol modification recommended, regular walking program  Hyperlipidemia Recommend lifestyle modification After further discussion, we have not started statins given no aortic atherosclerosis or coronary calcification, there was 50 point drop in  numbers with diet changes in the past  Asthma Followed by pulmonary and primary care Reports symptoms stable on Symbicort  Ms. Vinsant was seen in consultation for Dr. Carlynn Purl will be referred back to her office for ongoing care of the issues detailed above  Signed, Dossie Arbour, M.D., Ph.D. [facility] [place], [place] [phone]

## 2024-03-01 NOTE — Patient Instructions (Signed)

## 2024-03-05 ENCOUNTER — Ambulatory Visit
Admission: RE | Admit: 2024-03-05 | Discharge: 2024-03-05 | Disposition: A | Discharge status: Home / Self Care | Source: Ambulatory Visit | Attending: Student | Admitting: Student

## 2024-03-05 DIAGNOSIS — M1711 Unilateral primary osteoarthritis, right knee: Secondary | ICD-10-CM | POA: Diagnosis present

## 2024-03-05 DIAGNOSIS — M25561 Pain in right knee: Secondary | ICD-10-CM | POA: Diagnosis present

## 2024-03-05 DIAGNOSIS — M25461 Effusion, right knee: Secondary | ICD-10-CM | POA: Insufficient documentation

## 2024-03-19 NOTE — Progress Notes (Signed)
 Pt in the office on 02/27/24 for injection.

## 2024-04-14 ENCOUNTER — Encounter: Payer: Self-pay | Admitting: Internal Medicine

## 2024-04-15 MED ORDER — PREDNISONE 20 MG PO TABS: 20.0000 mg | ORAL_TABLET | Freq: Every day | ORAL | 0 refills | Status: DC

## 2024-04-25 ENCOUNTER — Ambulatory Visit: Admitting: Internal Medicine

## 2024-04-25 ENCOUNTER — Encounter: Payer: Self-pay | Admitting: Internal Medicine

## 2024-04-25 VITALS — BP 118/80 | HR 96 | Temp 98.7°F | Ht 66.0 in | Wt 200.4 lb

## 2024-04-25 DIAGNOSIS — J4540 Moderate persistent asthma, uncomplicated: Secondary | ICD-10-CM

## 2024-04-25 DIAGNOSIS — J9611 Chronic respiratory failure with hypoxia: Secondary | ICD-10-CM

## 2024-04-25 DIAGNOSIS — Z7722 Contact with and (suspected) exposure to environmental tobacco smoke (acute) (chronic): Secondary | ICD-10-CM

## 2024-04-25 DIAGNOSIS — H1010 Acute atopic conjunctivitis, unspecified eye: Secondary | ICD-10-CM

## 2024-04-25 DIAGNOSIS — R059 Cough, unspecified: Secondary | ICD-10-CM | POA: Diagnosis not present

## 2024-04-25 DIAGNOSIS — E669 Obesity, unspecified: Secondary | ICD-10-CM | POA: Diagnosis not present

## 2024-04-25 DIAGNOSIS — R5381 Other malaise: Secondary | ICD-10-CM

## 2024-04-25 DIAGNOSIS — Z9981 Dependence on supplemental oxygen: Secondary | ICD-10-CM

## 2024-04-25 DIAGNOSIS — Z6832 Body mass index (BMI) 32.0-32.9, adult: Secondary | ICD-10-CM

## 2024-04-25 MED ORDER — FLUTICASONE PROPIONATE 50 MCG/ACT NA SUSP: 2.0000 | Freq: Every day | NASAL | 8 refills | Status: AC

## 2024-04-25 NOTE — Patient Instructions (Addendum)
 Continue Symbicort  as prescribed Please rinse mouth after use  For Allergies- Start Flonase  for allergies Continue Zyrtec as prescribed  For Cough- recommend DayQuil/NyQuil combination  Avoid Allergens and Irritants Avoid secondhand smoke Avoid SICK contacts Recommend  Masking  when appropriate Recommend Keep up-to-date with vaccinations   Continue Oxygen at night  Recommend weight loss

## 2024-04-25 NOTE — Progress Notes (Signed)
 Name: Monica Hardin MRN: [id number] DOB: July 26, 1966    SYNOPSIS Establish care for findings consistent with asthma PFTs do not show obstructive lung disease No evidence of sleep apnea    TESTS Pulmonary function testing February 27, 2024 Reviewed in detail with patient FEV1 FVC ratio postbronchodilator is 80% predicted FEV1 79% predicted Significant bronchodilator response FVC is 62% predicted TLC was 72% predicted Findings to suggest some restrictive lung disease No significant abnormality in the DLCO Flow-volume loops showed low lung volumes  PSG 08/2023 did NOT show significant AHI which was 2.4 Overnight pulse oximetry did not show any type of significant hypoxia Overnight pulse oximetry ordered-patient now on oxygen therapy, just barely met the criteria Advised to continue oxygen as prescribed  March 2025 FENO  20    CHIEF COMPLAINT:  Follow-up assessment for moderate persistent asthma   HISTORY OF PRESENT ILLNESS: Follow-up assessment for shortness of breath Patient has significant secondhand smoking history for approximately 25 years of her life Patient has increased shortness of breath over the last several years Progressively worsening over the last several months clinical symptoms correlate with moderate persistent ASTHMA Patient prescribed prednisone  and antibiotics 2 weeks ago for acute bronchitis Symptoms have resolved but has a residual cough Continues to take Symbicort  as prescribed Rinse his mouth out after use  No exacerbation at this time No evidence of heart failure at this time No evidence or signs of infection at this time No respiratory distress No fevers, chills, nausea, vomiting, diarrhea No evidence of lower extremity edema No evidence hemoptysis  Patient has sinus congestion does not take any nasal sprays PAST MEDICAL HISTORY :   has a past medical history of Cancer (HCC), Chronic fatigue, Hyperlipidemia, Hypothyroidism, Overweight,  Primary osteoarthritis of both hands (06/15/2018), Sciatica, right side, and Thyroid  disease.  has a past surgical history that includes LEEP ([id number]); Mandible fracture surgery (1996); laparoscopy (N/A, 08/24/2015); Fracture surgery; Abdominal hysterectomy (N/A, 10/05/2015); Salpingoophorectomy (Bilateral, 10/05/2015); Colonoscopy with propofol  (N/A, 11/22/2016); and Cholecystectomy (N/A, 03/27/2017). Prior to Admission medications   Medication Sig Start Date End Date Taking? Authorizing Provider  Cyanocobalamin (B-12) 1000 MCG SUBL Place 1 tablet under the tongue daily. 02/27/23   Sowles, Krichna, MD  DULoxetine  (CYMBALTA ) 60 MG capsule Take 1 capsule (60 mg total) by mouth daily. 11/13/23   Sowles, Krichna, MD  levothyroxine  (SYNTHROID ) 100 MCG tablet Take 100 mcg by mouth daily. 06/19/23   [provider]  liothyronine (CYTOMEL) 5 MCG tablet Take 5 mcg by mouth daily. 06/19/23   [provider]  Magnesium  Glycinate 100 MG CAPS Take 1 capsule by mouth daily at 12 noon. 02/28/23   Sowles, Krichna, MD  pregabalin  (LYRICA ) 75 MG capsule Take 1-2 capsules (75-150 mg total) by mouth 3 (three) times daily. 1 in am, 1 in pm and two at bedtime 11/13/23   Sowles, Krichna, MD  traZODone  (DESYREL ) 100 MG tablet Take 1 tablet (100 mg total) by mouth at bedtime. 11/13/23   Sowles, Krichna, MD  Vitamin D , Ergocalciferol , (DRISDOL ) 1.25 MG (50000 UNIT) CAPS capsule Take 1 capsule (50,000 Units total) by mouth once a week. 06/09/23   Sowles, Krichna, MD   Allergies  Allergen Reactions   Statins     myalgia    FAMILY HISTORY:  family history includes Asthma in her son; Breast cancer (age of onset: 29) in her paternal aunt; Cancer in her father and mother; Diabetes in her paternal grandmother; Kidney Stones in her daughter; Lung cancer in her father  and mother; Thyroid  disease in her sister. SOCIAL HISTORY:  reports that she has never smoked. She has never used smokeless tobacco. She reports that  she does not drink alcohol and does not use drugs.  LMP 09/29/2015 Comment: upreg neg   BP 118/80 (BP Location: Right Arm, Patient Position: Sitting, Cuff Size: Normal)   Pulse 96   Temp 98.7 F (37.1 C) (Oral)   Ht 5\' 6"  (1.676 m)   Wt 200 lb 6.4 oz (90.9 kg)   LMP 09/29/2015 Comment: upreg neg  SpO2 93%   BMI 32.35 kg/m     Review of Systems: Gen:  Denies  fever, sweats, chills weight loss  HEENT: Denies blurred vision, double vision, ear pain, eye pain, hearing loss, nose bleeds, sore throat Cardiac:  No dizziness, chest pain or heaviness, chest tightness,edema, No JVD Resp: + cough, -sputum production, +shortness of breath,-wheezing, -hemoptysis,  Other:  All other systems negative   Physical Examination:   General Appearance: No distress  EYES PERRLA, EOM intact.   NECK Supple, No JVD Pulmonary: normal breath sounds, No wheezing.  CardiovascularNormal S1,S2.  No m/r/g.   Abdomen: Benign, Soft, non-tender. Neurology UE/LE 5/5 strength, no focal deficits Ext pulses intact, cap refill intact ALL OTHER ROS ARE NEGATIVE  CBC    Component Value Date/Time   WBC 5.5 12/29/2023 1139   RBC 4.64 12/29/2023 1139   HGB 14.3 12/29/2023 1139   HGB 14.2 11/13/2023 1039   HCT 43.5 12/29/2023 1139   HCT 45.1 11/13/2023 1039   PLT 270 12/29/2023 1139   PLT 328 11/13/2023 1039   MCV 93.8 12/29/2023 1139   MCV 94 11/13/2023 1039   MCH 30.8 12/29/2023 1139   MCHC 32.9 12/29/2023 1139   RDW 12.8 12/29/2023 1139   RDW 12.5 11/13/2023 1039   LYMPHSABS 2.5 11/13/2023 1039   MONOABS 0.3 10/01/2015 1040   EOSABS 0.2 11/13/2023 1039   BASOSABS 0.0 11/13/2023 1039      Latest Ref Rng & Units 12/29/2023   11:39 AM 11/13/2023   10:39 AM 05/08/2023    2:04 PM  BMP  Glucose 70 - 99 mg/dL 97  161  86   BUN 6 - 20 mg/dL 17  16  16    Creatinine 0.44 - 1.00 mg/dL 0.96  0.45  4.09   BUN/Creat Ratio 9 - 23  16  18    Sodium 135 - 145 mmol/L 137  139  139   Potassium 3.5 - 5.1 mmol/L  3.5  4.2  4.3   Chloride 98 - 111 mmol/L 102  99  102   CO2 22 - 32 mmol/L 25  28  25    Calcium  8.9 - 10.3 mg/dL 8.9  9.7  9.4        ASSESSMENT AND PLAN SYNOPSIS 58 year old obese white female seen today for follow-up assessment for progressive shortness of breath and dyspnea on exertion over the last several years with significant secondhand smoke exposure history family lung cancer consistent with reactive airways disease and asthma with positive bronchodilator response, diagnosis of moderate persistent asthma  Assessment of shortness of breath Likely diagnosed ASTHMA Symbicort  seems to be helping her symptoms however had acute wheezing and coughing was given prednisone  2 weeks ago which has helped her symptoms Patient has a residual cough Recommend continuing Symbicort  as prescribed Rinse mouth after use Avoid Allergens and Irritants Avoid secondhand smoke Avoid SICK contacts Recommend  Masking  when appropriate Recommend Keep up-to-date with vaccinations   Allergic rhinitis Start  Flonase  2 sprays each nostril daily Continue Zyrtec  COUGH recommend DayQuil/NyQuil combination  Regarding assessment of OSA Patient does not have significant OSA that would require CPAP therapy  Chronic Hypoxic resp failure due to COPD -Patient benefits from oxygen therapy 2L [place]  -recommend using oxygen as prescribed We will repeat this test once ASTHMA symptoms under control  Shortness of breath with normal chest x-ray Previous CT chest did not show significant abnormalities  Obesity -recommend significant weight loss -recommend changing diet  Deconditioned state -Recommend increased daily activity and exercise  Follow-up with endocrinology Recommend weight loss   MEDICATION ADJUSTMENTS/LABS AND TESTS ORDERED: Symbicort  Inhaler 2 puffs in AM and 2 puffs in PM Please rinse mouth after every use Recommend weight loss Avoid Allergens and Irritants Avoid secondhand smoke Avoid  SICK contacts Recommend  Masking  when appropriate Recommend Keep up-to-date with vaccinations Start Flonase  DayQuil and NyQuil combination for cough Continue oxygen as prescribed  CURRENT MEDICATIONS REVIEWED AT LENGTH WITH PATIENT TODAY   Patient  satisfied with Plan of action and management. All questions answered  Follow-up in 6 months We will reevaluate nocturnal hypoxia Reevaluate use of Symbicort    Total time spent 41 minutes   Lady Pier, M.D.  [facility]  Medical Director [facility] Medical Director [facility] Cardio-Pulmonary Department

## 2024-05-08 ENCOUNTER — Encounter: Payer: Self-pay | Admitting: Internal Medicine

## 2024-05-08 ENCOUNTER — Ambulatory Visit: Admitting: Family Medicine

## 2024-05-08 ENCOUNTER — Encounter: Payer: Self-pay | Admitting: Family Medicine

## 2024-05-08 VITALS — BP 126/76 | HR 85 | Resp 16 | Ht 66.0 in | Wt 202.8 lb

## 2024-05-08 DIAGNOSIS — H9202 Otalgia, left ear: Secondary | ICD-10-CM

## 2024-05-08 MED ORDER — AZELASTINE HCL 0.1 % NA SOLN: 1.0000 | Freq: Two times a day (BID) | NASAL | 2 refills | Status: DC

## 2024-05-08 NOTE — Progress Notes (Signed)
 Name: Monica Hardin   MRN: [id number]    DOB: 12/16/1966   Date:05/08/2024       Progress Note  Subjective  Chief Complaint  Chief Complaint  Patient presents with   Ear Pain    L ear x2 days aches, had previous rhinoconjunctivitis    Discussed the use of AI scribe software for clinical note transcription with the patient, who gave verbal consent to proceed.  History of Present Illness Monica Hardin is a 58 year old female who presents with left ear pain and redness.  She has been experiencing left ear pain and redness that began last night. The ear felt 'red and hot' and was visibly red and uncomfortable, but the redness resolved on its own within a few hours. She has a history of ear infections and is concerned about a possible recurrence.  Approximately one month ago, she was treated for a bronchial infection and completed a course of prednisone . Her cough has improved, but she continues to experience a sensation of fullness in both ears, with the right ear feeling stuffy. She has been using Flonase  nasal spray for her allergies for about two weeks.  She denies taking any current anti-inflammatory medications such as Tylenol  or Aleve , having completed her prednisone  course. Her ears have felt 'funny' and she experiences pressure when opening and closing her jaw. No current ear infection symptoms noted.    Patient Active Problem List   Diagnosis Date Noted   Dyspnea 02/27/2024   Neurogenic claudication due to lumbar spinal stenosis 12/07/2022   Varicose veins of both lower extremities 02/27/2019   Primary osteoarthritis of both hands 06/15/2018   Primary osteoarthritis of both knees 06/15/2018   Primary osteoarthritis of both feet 06/15/2018   Positive ANA (antinuclear antibody) 06/15/2018   Fibromyalgia 06/15/2018   Encounter for screening colonoscopy 11/07/2016   Surgical menopause on hormone replacement therapy 09/01/2016   Vitamin D  deficiency 04/20/2016   Low vitamin  B12 level 04/20/2016   History of uterine leiomyoma 04/19/2016   Sciatica, right side 04/19/2016   Status post TAH-BSO 11/12/2015   History of ovarian cyst 11/12/2015   Dyslipidemia 07/04/2015   Adult hypothyroidism 07/04/2015   Excess weight 07/04/2015   Disturbance of skin sensation 07/04/2015   Lipoma of arm 12/03/2014   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    Social History   Tobacco Use   Smoking status: Never   Smokeless tobacco: Never  Substance Use Topics   Alcohol use: Never     Current Outpatient Medications:    budesonide -formoterol  (SYMBICORT ) 160-4.5 MCG/ACT inhaler, Inhale 2 puffs into the lungs 2 (two) times daily., Disp: 1 each, Rfl: 12   Cyanocobalamin (B-12) 1000 MCG SUBL, Place 1 tablet under the tongue daily., Disp: 100 tablet, Rfl: 1   DULoxetine  (CYMBALTA ) 60 MG capsule, Take 1 capsule (60 mg total) by mouth daily., Disp: 90 capsule, Rfl: 1   fluticasone  (FLONASE ) 50 MCG/ACT nasal spray, Place 2 sprays into both nostrils daily., Disp: 1 g, Rfl: 8   indomethacin (INDOCIN) 50 MG capsule, Take 50 mg by mouth., Disp: , Rfl:    levothyroxine  (SYNTHROID ) 100 MCG tablet, Take 100 mcg by mouth daily., Disp: , Rfl:    liothyronine (CYTOMEL) 5 MCG tablet, Take 5 mcg by mouth daily., Disp: , Rfl:    Magnesium  Glycinate 100 MG CAPS, Take 1 capsule by mouth daily at 12 noon., Disp: 100 capsule, Rfl: 1   metFORMIN  (GLUCOPHAGE -XR) 500 MG 24  hr tablet, Take 1 tablet (500 mg total) by mouth daily with breakfast., Disp: 90 tablet, Rfl: 1   predniSONE  (DELTASONE ) 20 MG tablet, Take 1 tablet (20 mg total) by mouth daily., Disp: 7 tablet, Rfl: 0   pregabalin  (LYRICA ) 75 MG capsule, Take 1-2 capsules (75-150 mg total) by mouth 3 (three) times daily. 1 in am, 1 in pm and two at bedtime, Disp: 360 capsule, Rfl: 1   traZODone  (DESYREL ) 100 MG tablet, Take 1 tablet (100 mg total) by mouth at bedtime., Disp: 90 tablet, Rfl: 1   Vitamin D ,  Ergocalciferol , (DRISDOL ) 1.25 MG (50000 UNIT) CAPS capsule, Take 1 capsule (50,000 Units total) by mouth once a week., Disp: 12 capsule, Rfl: 1  Allergies  Allergen Reactions   Statins     myalgia    ROS  Ten systems reviewed and is negative except as mentioned in HPI    Objective  Vitals:   05/08/24 1113  BP: 126/76  Pulse: 85  Resp: 16  SpO2: 95%  Weight: 202 lb 12.8 oz (92 kg)  Height: 5\' 6"  (1.676 m)    Body mass index is 32.73 kg/m.  Physical Exam CONSTITUTIONAL: Patient appears well-developed and well-nourished. No distress. HEENT: Head atraumatic, normocephalic, neck supple. Left  ear canal with cerumen, slightly opaque. Ear canals otherwise normal. Patient gave verbal consent and removed wax with curette from left ear canal  CARDIOVASCULAR: Normal rate, regular rhythm and normal heart sounds. No murmur heard. No BLE edema. PULMONARY: Effort normal and breath sounds normal. No respiratory distress. ABDOMINAL: There is no tenderness or distention. MUSCULOSKELETAL: antalgic gait due to right knee pain PSYCHIATRIC: Patient has a normal mood and affect. Behavior is normal. Judgment and thought content normal.  Recent Results (from the past 2160 hours)  Hemoglobin A1c     Status: Abnormal   Collection Time: 02/14/24  8:45 AM  Result Value Ref Range   Hgb A1c MFr Bld 6.2 (H) 4.8 - 5.6 %    Comment:          Prediabetes: 5.7 - 6.4          Diabetes: >6.4          Glycemic control for adults with diabetes: <7.0    Est. average glucose Bld gHb Est-mCnc 131 mg/dL  Synovial cell count + diff, w/ crystals     Status: Abnormal   Collection Time: 02/16/24  3:02 PM  Result Value Ref Range   Color, Synovial ORANGE (A) YELLOW   Appearance-Synovial CLOUDY (A) CLEAR   Crystals, Fluid NO CRYSTALS SEEN    WBC, Synovial 409 (H) 0 - 200 /cu mm   Neutrophil, Synovial 33 %   Lymphocytes-Synovial Fld 52 %   Monocyte-Macrophage-Synovial Fluid 15 %   Eosinophils-Synovial 0 %     Comment: Performed at [facility], [address] [place], [place] [address]  Synovial cell count + diff, w/ crystals     Status: Abnormal   Collection Time: 02/27/24  4:06 PM  Result Value Ref Range   Color, Synovial YELLOW (A) YELLOW   Appearance-Synovial CLEAR CLEAR   Crystals, Fluid NO CRYSTALS SEEN    WBC, Synovial 913 (H) 0 - 200 /cu mm   Neutrophil, Synovial 15 %   Lymphocytes-Synovial Fld 37 %   Monocyte-Macrophage-Synovial Fluid 48 %   Eosinophils-Synovial 0 %    Comment: Performed at [facility], [address] [place], [place] [address]  Pulmonary Function Test [facility] Only  Status: None   Collection Time: 02/27/24  4:08 PM  Result Value Ref Range   FVC-%Pred-Pre 62 %   FVC-Post 2.79 L   FVC-%Pred-Post 77 %   FVC-%Change-Post 22 %   FEV1-Pre 1.54 L   FEV1-%Pred-Pre 54 %   FEV1-Post 2.23 L   FEV1-%Pred-Post 79 %   FEV1-%Change-Post 44 %   FEV6-Pre 2.27 L   FEV6-%Pred-Pre 64 %   FEV6-Post 2.79 L   FEV6-%Pred-Post 79 %   FEV6-%Change-Post 22 %   Pre FEV1/FVC ratio 68 %   FEV1FVC-%Pred-Pre 86 %   Post FEV1/FVC ratio 80 %   FEV1FVC-%Change-Post 17 %   Pre FEV6/FVC Ratio 100 %   FEV6FVC-%Pred-Pre 103 %   Post FEV6/FVC ratio 100 %   FEV6FVC-%Pred-Post 103 %   FEF 25-75 Pre 1.40 L/sec   FEF2575-%Pred-Pre 54 %   FEF 25-75 Post 2.49 L/sec   FEF2575-%Pred-Post 97 %   FEF2575-%Change-Post 78 %   RV 1.95 L   RV % pred 95 %   TLC 3.87 L   TLC % pred 72 %   DLCO unc 16.41 ml/min/mmHg   DLCO unc % pred 75 %   DL/VA 1.61 ml/min/mmHg/L   DL/VA % pred 96 %   FVC-Pre 2.27 L  POCT EXHALED NITRIC OXIDE      Status: None   Collection Time: 02/29/24  8:48 AM  Result Value Ref Range   FeNO level (ppb) 20     Assessment & Plan Eustachian tube dysfunction Chronic dysfunction with recent exacerbation, likely related to recent bronchial infection. No infection present. Left ear redness and heat resolved spontaneously. - Continue Flonase  nasal spray in  the morning. - Add azelastine nasal spray at night. - Administer acetaminophen  twice daily for a few days.  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 - Advise avoiding hard foods like nuts and apples, gum.  - take Tylenol  prn

## 2024-05-20 ENCOUNTER — Other Ambulatory Visit: Payer: Self-pay | Admitting: Family Medicine

## 2024-05-20 DIAGNOSIS — G4700 Insomnia, unspecified: Secondary | ICD-10-CM

## 2024-05-21 ENCOUNTER — Encounter: Payer: Self-pay | Admitting: Family Medicine

## 2024-05-21 ENCOUNTER — Other Ambulatory Visit: Payer: Self-pay | Admitting: Family Medicine

## 2024-05-21 DIAGNOSIS — G4700 Insomnia, unspecified: Secondary | ICD-10-CM

## 2024-05-26 NOTE — Discharge Instructions (Addendum)
 Instructions after Total Knee Replacement   James P. Hooten, Jr., M.D.    Dept. of Orthopaedics & Sports Medicine [facility] [address] [place], [place]  [address]  Phone: [phone]   Fax: [phone]       [email or web address]       DIET: Drink plenty of non-alcoholic fluids. Resume your normal diet. Include foods high in fiber.  ACTIVITY:  You may use crutches or a walker with weight-bearing as tolerated, unless instructed otherwise. You may be weaned off of the walker or crutches by your Physical Therapist.  Do NOT place pillows under the knee. Anything placed under the knee could limit your ability to straighten the knee.   Use the Bone Foam 3 times a day for 30 minutes each session to help straighten the knee. Continue doing gentle exercises. Exercising will reduce the pain and swelling, increase motion, and prevent muscle weakness.   Please continue to use the TED compression stockings for 6 weeks. You may remove the stockings at night, but should reapply them in the morning. Do not drive or operate any equipment until instructed.  WOUND CARE:  The initial dressing (Aquacel) can remain in place for 7 days (see separate instructions). Continue to use the PolarCare or ice packs periodically to reduce pain and swelling. You may bathe or shower after the staples are removed at the first office visit following surgery.  MEDICATIONS: You may resume your regular medications. Please take the pain medication as prescribed on the medication. Do not take pain medication on an empty stomach. Unless instructed otherwise, you should take an enteric-coated aspirin 81 mg. TWICE a day. (This along with elevation will help reduce the possibility of blood clots/phlebitis in your operated leg.) Use a stool softener (such as Senokot-S or Colace) daily and a laxative (such as Miralax  or Dulcolax) as needed to prevent constipation.  Do not drive or drink alcoholic beverages when  taking pain medications.  CALL THE OFFICE FOR: Temperature above 101 degrees Excessive bleeding or drainage on the dressing. Excessive swelling, coldness, or paleness of the toes. Persistent nausea and vomiting.  FOLLOW-UP:  You should have an appointment to return to the office in 10-14 days after surgery. Arrangements have been made for continuation of Physical Therapy (either home therapy or outpatient therapy).     [facility] Department Directory         [email or web address]       [email or web address]          Cardiology  Appointments: [place] - [phone]  Endocrinology  Appointments: [place] [phone] [place] - [phone]  Gastroenterology  Appointments: [place] [phone] [place] - [phone]        General Surgery   Appointments: [facility]  Internal Medicine/Family Medicine  Appointments: [facility] [place] - [phone] [place] - [phone]  Metabolic and Weigh Loss Surgery  Appointments: [facility]        Neurology  Appointments: [place] [phone] [place] - [phone]  Neurosurgery  Appointments: [place]  Obstetrics & Gynecology  Appointments: [place] [phone] [place] - [phone]        Pediatrics  Appointments: [place] [phone] [place] - [phone]  Physiatry  Appointments: [place] [phone]  Physical Therapy  Appointments: [place] - [phone]        Podiatry  Appointments: [place] [phone] [place] - [phone]  Pulmonology  Appointments: [place]  Rheumatology  Appointments: [place] [phone]  [place] Location: [facility]  [address] [place], [place]  [address]  [place] Location: [facility]. [address] [place], [place]  [address]  [place]  Location: [facility] [address] [place], [place]  [address]    [facility] They will call you to set up when they are coming out to see you   [address] [place], [place] [address] Hours:  Open ? Closes 5?PM Phone: [phone]

## 2024-05-26 NOTE — H&P (Signed)
 ORTHOPAEDIC HISTORY & PHYSICAL Monica Hardin, [place] - 05/24/2024 8:15 AM EDT Formatting of this note is different from the original. NAME: Monica Hardin H&P Date: 05/24/2024 Procedure Date: 06/10/2024  Chief Complaint: right knee pain and swelling  HPI Monica Hardin is a 58 y.o. female who has severe Right knee pain. The patient reports nearly an entire year of bilateral knee discomfort, with her right bothering her more than her left. She reports noticeable swelling of the knee, and pain along the anterior medial margins. She denies having any numbness, tingling or radiation symptoms. She does report intermittent feelings of giving way of the knee. No locking symptoms. She states that her pain is made worse with any attempted prolonged ambulation or standing. She previously went workup for potential infectious or inflammatory etiologies which came back negative. MRI results showed meniscal pathologies with tricompartmental degenerative changes worse along the patellofemoral margins. She has been using on and off ambulation assistive devices to help with ambulation. She has failed conservative treatment including activity modification, topical and oral NSAIDs, Tylenol , knee joint aspiration with intra-articular corticosteroid and viscosupplementation injections and exercise with little to no relief . She has requested operative intervention for relief of her DJD symptoms. She denies any previous cardiac or pulmonary history. No previous DVTs or clots. Denies any previous surgeries on this right knee. Patient is noted to be a prediabetic, her last A1c was at 6.2.  Social Hx: Patient lives at home with her husband. She works for [employer]. She denies any illicit drug use, alcohol use, nicotine use or smoking.  Medications & Allergies Allergies: Allergies Allergen Reactions Statins-Hmg-Coa Reductase Inhibitors Muscle Pain  Home Medicines: Current Outpatient Medications on File Prior to  Visit Medication Sig Dispense Refill DULoxetine  (CYMBALTA ) 60 MG DR capsule Take 60 mg by mouth once daily ergocalciferol , vitamin D2, 1,250 mcg (50,000 unit) capsule Take 50,000 Units by mouth once a week estradioL  (ESTRACE ) 0.5 MG tablet Take 0.5 mg by mouth once daily indomethacin (INDOCIN) 50 MG capsule Take 1 capsule (50 mg total) by mouth 2 (two) times daily with meals for 90 days 60 capsule 0 levothyroxine  (SYNTHROID ) 75 MCG tablet Take 75 mcg by mouth once daily liothyronine (CYTOMEL) 5 MCG tablet Take 5 mcg by mouth once daily magnesium  glycinate 100 mg capsule Take 1 capsule by mouth once daily at 12 noon. metFORMIN  (GLUCOPHAGE -XR) 500 MG XR tablet Take 500 mg by mouth daily with breakfast pregabalin  (LYRICA ) 75 MG capsule Take 75 mg by mouth 3 (three) times daily 1 in am, 1 in pm and two at bedtime traZODone  (DESYREL ) 100 MG tablet Take 100 mg by mouth at bedtime  No current facility-administered medications on file prior to visit.  Medical / Surgical History  Past Medical History: Diagnosis Date CFIDS (chronic fatigue and immune dysfunction syndrome) (CMS/HHS-HCC) Chronic fatigue Dyslipidemia Fibromyalgia Hypothyroidism Menopause Osteoarthritis both hands, knees and feet Sciatica Vertigo Vitamin B 12 deficiency Vitamin D  deficiency   Past Surgical History: Procedure Laterality Date bilateral salpingoophorectomy Bilateral 10/05/2015 BLEPHAROPLASTY UPPER EYELID Bilateral 11/02/2021 Procedure: BILATERAL UPPER EYELID BLEPHAROPLASTY; Surgeon: Arlene Ben, MD; Location: [facility]; Service: Ophthalmology; Laterality: Bilateral; CHOLECYSTECTOMY HYSTERECTOMY VAGINAL Mandible fracture surgery MOHS SURGERY Right RIGHT EYE- to remove skin cancer   Physical Exam  Ht: Wt: BMI: There is no height or weight on file to calculate BMI.  General/Constitutional: No apparent distress: well-nourished and well developed. Eyes: Pupils equal, round with synchronous  movement. Lymphatic: No palpable adenopathy. Respiratory: Patient has good chest rise and  fall with inspiration and expiration. All lung fields are clear to auscultation bilaterally. There is no Rales, rhonchi or wheezes appreciated. Cardiovascular: Upon auscultation there is a regular rate and rhythm without any murmurs, rubs, gallops or heaves appreciated. There does not appear to be any swelling down the lower extremities. Posterior tibial pulses appreciated bilaterally, 2+. Integumentary: No impressive skin lesions present, except as noted in detailed exam. Neuro/Psych: Normal mood and affect, oriented to person, place and time. Musculoskeletal: see exam below  Right Knee:  Upon inspection of the patient's right knee, there is a noticeable amount of soft tissue swelling and mild amount of joint effusion present. No redness or deformity noted.  Soft tissue swelling: moderate Effusion: moderate Erythema: none Crepitance: none Tenderness: medial joint line, lateral joint line, and lateral retinaculum Alignment: normal Mediolateral laxity: stable Anterior drawer test:negative Lachman`s test: negative McMurray`s test: negative Atrophy: No significant atrophy. Quadriceps tone was fair to good. Range of Motion: 12 degrees off of full extension/ 108 degrees flexion. Does report increased discomfort, and probably could flex back further, but has quite a bit of swelling  Neurovascularly intact all dermatomes extending down her right lower extremity. Posterior tibial pulses appreciated.  Imaging right Knee Imaging: A series of x-rays were ordered and interpreted of the patient's right knee. These images included AP standing, lateral and sunrise views. Upon inspection, there appears to be narrowing most notably along the medial cartilage space with approximate 80% joint space narrowing. Subchondral changes are appreciated. Osteophyte formation is present. Patella appears to be slightly  lateralized, best appreciated on sunrise views. Overall alignment neutral. No fractures, lytic lesions or gross deformities appreciated on films.  Assesment and Plan Knee DJD  I have recommended that Monica Hardin undergo right total knee replacement. Consents has been signed. The risks, benefits, prognosis and alternatives including but not limited to DVT, PE, infection, neurovascular injury, failure of the procedure and death were explained to the patient and she is willing to proceed with surgery as described to her by myself. Plan will be for post operative admission of at least 1 midnight for pain control and PT. She will be managed with DVT prophylaxis, antibiotics preoperatively for 24 hours and aggressive in patient rehab.  Pre, intra and post op interventions were discussed. Patient has good understanding  Medication Reconciliation was performed. Discussed cessation of NSAIDs, metformin , vitamins and supplements.  A total of 45 minutes was spent reviewing patient's charts, medical reconciliation, discussing/educating the patient about surgical interventions, and answering any questions provided by the patient.  JOSHUA Alex Hylan, PA [facility] 05/24/2024  Electronically signed by Monica Sames, PA at 05/24/2024 9:10 AM EDT

## 2024-05-31 ENCOUNTER — Other Ambulatory Visit: Payer: Self-pay

## 2024-05-31 ENCOUNTER — Encounter
Admission: RE | Admit: 2024-05-31 | Discharge: 2024-05-31 | Disposition: A | Discharge status: Home / Self Care | Source: Ambulatory Visit | Attending: Orthopedic Surgery | Admitting: Orthopedic Surgery

## 2024-05-31 VITALS — BP 132/64 | HR 84 | Temp 98.1°F | Resp 16 | Ht 66.0 in | Wt 203.0 lb

## 2024-05-31 DIAGNOSIS — R0600 Dyspnea, unspecified: Secondary | ICD-10-CM | POA: Insufficient documentation

## 2024-05-31 DIAGNOSIS — E559 Vitamin D deficiency, unspecified: Secondary | ICD-10-CM | POA: Diagnosis not present

## 2024-05-31 DIAGNOSIS — Z01810 Encounter for preprocedural cardiovascular examination: Secondary | ICD-10-CM | POA: Diagnosis not present

## 2024-05-31 DIAGNOSIS — R7989 Other specified abnormal findings of blood chemistry: Secondary | ICD-10-CM | POA: Diagnosis not present

## 2024-05-31 DIAGNOSIS — R7303 Prediabetes: Secondary | ICD-10-CM | POA: Diagnosis not present

## 2024-05-31 DIAGNOSIS — Z01818 Encounter for other preprocedural examination: Secondary | ICD-10-CM | POA: Insufficient documentation

## 2024-05-31 DIAGNOSIS — G9332 Myalgic encephalomyelitis/chronic fatigue syndrome: Secondary | ICD-10-CM | POA: Insufficient documentation

## 2024-05-31 DIAGNOSIS — M48062 Spinal stenosis, lumbar region with neurogenic claudication: Secondary | ICD-10-CM | POA: Diagnosis not present

## 2024-05-31 DIAGNOSIS — M1711 Unilateral primary osteoarthritis, right knee: Secondary | ICD-10-CM | POA: Insufficient documentation

## 2024-05-31 DIAGNOSIS — Z22322 Carrier or suspected carrier of Methicillin resistant Staphylococcus aureus: Secondary | ICD-10-CM | POA: Diagnosis not present

## 2024-05-31 DIAGNOSIS — Z01812 Encounter for preprocedural laboratory examination: Secondary | ICD-10-CM | POA: Diagnosis present

## 2024-05-31 DIAGNOSIS — D8989 Other specified disorders involving the immune mechanism, not elsewhere classified: Secondary | ICD-10-CM | POA: Diagnosis not present

## 2024-05-31 DIAGNOSIS — Z22321 Carrier or suspected carrier of Methicillin susceptible Staphylococcus aureus: Secondary | ICD-10-CM | POA: Diagnosis not present

## 2024-05-31 HISTORY — DX: Unspecified asthma, uncomplicated: J45.909

## 2024-05-31 LAB — CBC
HCT: 40.2 % (ref 36.0–46.0)
Hemoglobin: 13.2 g/dL (ref 12.0–15.0)
MCH: 30.5 pg (ref 26.0–34.0)
MCHC: 32.8 g/dL (ref 30.0–36.0)
MCV: 92.8 fL (ref 80.0–100.0)
Platelets: 295 10*3/uL (ref 150–400)
RBC: 4.33 MIL/uL (ref 3.87–5.11)
RDW: 12.4 % (ref 11.5–15.5)
WBC: 5.7 10*3/uL (ref 4.0–10.5)
nRBC: 0 % (ref 0.0–0.2)

## 2024-05-31 LAB — COMPREHENSIVE METABOLIC PANEL WITH GFR
ALT: 25 U/L (ref 0–44)
AST: 22 U/L (ref 15–41)
Albumin: 4.1 g/dL (ref 3.5–5.0)
Alkaline Phosphatase: 66 U/L (ref 38–126)
Anion gap: 9 (ref 5–15)
BUN: 19 mg/dL (ref 6–20)
CO2: 29 mmol/L (ref 22–32)
Calcium: 9.2 mg/dL (ref 8.9–10.3)
Chloride: 101 mmol/L (ref 98–111)
Creatinine, Ser: 0.8 mg/dL (ref 0.44–1.00)
GFR, Estimated: >60 mL/min (ref >60)
Glucose, Bld: 122 mg/dL — ABNORMAL HIGH (ref 70–99)
Potassium: 3.6 mmol/L (ref 3.5–5.1)
Sodium: 139 mmol/L (ref 135–145)
Total Bilirubin: 0.5 mg/dL (ref 0.0–1.2)
Total Protein: 7.3 g/dL (ref 6.5–8.1)

## 2024-05-31 LAB — HEMOGLOBIN A1C
Hgb A1c MFr Bld: 5.7 % — ABNORMAL HIGH (ref 4.8–5.6)
Mean Plasma Glucose: 116.89 mg/dL

## 2024-05-31 LAB — URINALYSIS, ROUTINE W REFLEX MICROSCOPIC
Bilirubin Urine: NEGATIVE
Glucose, UA: NEGATIVE mg/dL
Hgb urine dipstick: NEGATIVE
Ketones, ur: NEGATIVE mg/dL
Leukocytes,Ua: NEGATIVE
Nitrite: NEGATIVE
Protein, ur: NEGATIVE mg/dL
Specific Gravity, Urine: 1.027 (ref 1.005–1.030)
pH: 5 (ref 5.0–8.0)

## 2024-05-31 LAB — SURGICAL PCR SCREEN
MRSA, PCR: POSITIVE — AB
Staphylococcus aureus: POSITIVE — AB

## 2024-05-31 LAB — C-REACTIVE PROTEIN: CRP: 1.4 mg/dL — ABNORMAL HIGH (ref <1.0)

## 2024-05-31 LAB — SEDIMENTATION RATE: Sed Rate: 18 mm/h (ref 0–30)

## 2024-05-31 NOTE — Patient Instructions (Addendum)
 Your procedure is scheduled on:  MONDAY JUNE 16  Report to the Registration Desk on the 1st floor of the [facility] To find out your arrival time, please call [phone] between 1PM - 3PM on:  FRIDAY JUNE 13  If your arrival time is 6:00 am, do not arrive before that time as the [facility] entrance doors do not open until 6:00 am.  REMEMBER: Instructions that are not followed completely may result in serious medical risk, up to and including death; or upon the discretion of your surgeon and anesthesiologist your surgery may need to be rescheduled.  Do not eat food after midnight the night before surgery.  No gum chewing or hard candies.  You may however, drink CLEAR liquids up to 2 hours before you are scheduled to arrive for your surgery. Do not drink anything within 2 hours of your scheduled arrival time.  Clear liquids include: - water  - apple juice without pulp - gatorade (not RED colors) - black coffee or tea (Do NOT add milk or creamers to the coffee or tea) Do NOT drink anything that is not on this list.   In addition, your doctor has ordered for you to drink the provided:  Ensure Pre-Surgery Clear Carbohydrate Drink  Drinking this carbohydrate drink up to two hours before surgery helps to reduce insulin  resistance and improve patient outcomes. Please complete drinking 2 hours before scheduled arrival time.  One week prior to surgery: MONDAY JUNE 9  Stop Anti-inflammatories (NSAIDS) such as Advil , Aleve , Ibuprofen , Motrin , Naproxen , Naprosyn  and Aspirin based products such as Excedrin, Goody's Powder, BC Powder. Stop ANY OVER THE COUNTER supplements until after surgery. Vitamin D , Ergocalciferol , (DRISDOL )  Selenium  azelastine  (ASTELIN )  cetirizine (ZYRTEC)  fluticasone  (FLONASE )  Magnesium  Glycinate  You may however, continue to take Tylenol  if needed for pain up until the day of surgery.  Continue taking all of your other prescription medications up until the  day of surgery.  ON THE DAY OF SURGERY ONLY TAKE THESE MEDICATIONS WITH SIPS OF WATER:  levothyroxine  (SYNTHROID )  DULoxetine  (CYMBALTA )   Use inhalers on the day of surgery and bring to the hospital. budesonide -formoterol  (SYMBICORT )   No Alcohol for 24 hours before or after surgery.  No Smoking including e-cigarettes for 24 hours before surgery.  No chewable tobacco products for at least 6 hours before surgery.  No nicotine patches on the day of surgery.  Do not use any "recreational" drugs for at least a week (preferably 2 weeks) before your surgery.  Please be advised that the combination of cocaine and anesthesia may have negative outcomes, up to and including death. If you test positive for cocaine, your surgery will be cancelled.  On the morning of surgery brush your teeth with toothpaste and water, you may rinse your mouth with mouthwash if you wish. Do not swallow any toothpaste or mouthwash.  Use CHG Soap or wipes as directed on instruction sheet.  Do not wear jewelry, make-up, hairpins, clips or nail polish.  For welded (permanent) jewelry: bracelets, anklets, waist bands, etc.  Please have this removed prior to surgery.  If it is not removed, there is a chance that hospital personnel will need to cut it off on the day of surgery.  Do not wear lotions, powders, or perfumes.   Do not shave body hair from the neck down 48 hours before surgery.  Contact lenses, hearing aids and dentures may not be worn into surgery.  Do not bring valuables  to the hospital. [facility] is not responsible for any missing/lost belongings or valuables.   Notify your doctor if there is any change in your medical condition (cold, fever, infection).  Wear comfortable clothing (specific to your surgery type) to the hospital.  After surgery, you can help prevent lung complications by doing breathing exercises.  Take deep breaths and cough every 1-2 hours. Your doctor may order a device called  an Incentive Spirometer to help you take deep breaths.  If you are being admitted to the hospital overnight, leave your suitcase in the car. After surgery it may be brought to your room.  In case of increased patient census, it may be necessary for you, the patient, to continue your postoperative care in the Same Day Surgery department.  If you are being discharged the day of surgery, you will not be allowed to drive home. You will need a responsible individual to drive you home and stay with you for 24 hours after surgery.   If you are taking public transportation, you will need to have a responsible individual with you.  Please call the Pre-admissions Testing Dept. at [phone] if you have any questions about these instructions.  Surgery Visitation Policy:  Patients having surgery or a procedure may have two visitors.  Children under the age of 52 must have an adult with them who is not the patient.  Inpatient Visitation:    Visiting hours are 7 a.m. to 8 p.m. Up to four visitors are allowed at one time in a patient room. The visitors may rotate out with other people during the day.  One visitor age 59 or older may stay with the patient overnight and must be in the room by 8 p.m.     Pre-operative 5 CHG Bath Instructions   You can play a key role in reducing the risk of infection after surgery. Your skin needs to be as free of germs as possible. You can reduce the number of germs on your skin by washing with CHG (chlorhexidine gluconate) soap before surgery. CHG is an antiseptic soap that kills germs and continues to kill germs even after washing.   DO NOT use if you have an allergy  to chlorhexidine/CHG or antibacterial soaps. If your skin becomes reddened or irritated, stop using the CHG and notify one of our RNs at [phone].   Please shower with the CHG soap starting 4 days before surgery using the following schedule:   STARTING THURSDAY JUNE 12     Please keep in  mind the following:  DO NOT shave, including legs and underarms, starting the day of your first shower.   You may shave your face at any point before/day of surgery.  Place clean sheets on your bed the day you start using CHG soap. Use a clean washcloth (not used since being washed) for each shower. DO NOT sleep with pets once you start using the CHG.   CHG Shower Instructions:  If you choose to wash your hair and private area, wash first with your normal shampoo/soap.  After you use shampoo/soap, rinse your hair and body thoroughly to remove shampoo/soap residue.  Turn the water OFF and apply about 3 tablespoons (45 ml) of CHG soap to a CLEAN washcloth.  Apply CHG soap ONLY FROM YOUR NECK DOWN TO YOUR TOES (washing for 3-5 minutes)  DO NOT use CHG soap on face, private areas, open wounds, or sores.  Pay special attention to the area where your surgery is  being performed.  If you are having back surgery, having someone wash your back for you may be helpful. Wait 2 minutes after CHG soap is applied, then you may rinse off the CHG soap.  Pat dry with a clean towel  Put on clean clothes/pajamas   If you choose to wear lotion, please use ONLY the CHG-compatible lotions on the back of this paper.     Additional instructions for the day of surgery: DO NOT APPLY any lotions, deodorants, cologne, or perfumes.   Put on clean/comfortable clothes.  Brush your teeth.  Ask your nurse before applying any prescription medications to the skin.      CHG Compatible Lotions   Aveeno Moisturizing lotion  Cetaphil Moisturizing Cream  Cetaphil Moisturizing Lotion  Clairol Herbal Essence Moisturizing Lotion, Dry Skin  Clairol Herbal Essence Moisturizing Lotion, Extra Dry Skin  Clairol Herbal Essence Moisturizing Lotion, Normal Skin  Curel Age Defying Therapeutic Moisturizing Lotion with Alpha Hydroxy  Curel Extreme Care Body Lotion  Curel Soothing Hands Moisturizing Hand Lotion  Curel Therapeutic  Moisturizing Cream, Fragrance-Free  Curel Therapeutic Moisturizing Lotion, Fragrance-Free  Curel Therapeutic Moisturizing Lotion, Original Formula  Eucerin Daily Replenishing Lotion  Eucerin Dry Skin Therapy Plus Alpha Hydroxy Crme  Eucerin Dry Skin Therapy Plus Alpha Hydroxy Lotion  Eucerin Original Crme  Eucerin Original Lotion  Eucerin Plus Crme Eucerin Plus Lotion  Eucerin TriLipid Replenishing Lotion  Keri Anti-Bacterial Hand Lotion  Keri Deep Conditioning Original Lotion Dry Skin Formula Softly Scented  Keri Deep Conditioning Original Lotion, Fragrance Free Sensitive Skin Formula  Keri Lotion Fast Absorbing Fragrance Free Sensitive Skin Formula  Keri Lotion Fast Absorbing Softly Scented Dry Skin Formula  Keri Original Lotion  Keri Skin Renewal Lotion Keri Silky Smooth Lotion  Keri Silky Smooth Sensitive Skin Lotion  Nivea Body Creamy Conditioning Oil  Nivea Body Extra Enriched Lotion  Nivea Body Original Lotion  Nivea Body Sheer Moisturizing Lotion Nivea Crme  Nivea Skin Firming Lotion  NutraDerm 30 Skin Lotion  NutraDerm Skin Lotion  NutraDerm Therapeutic Skin Cream  NutraDerm Therapeutic Skin Lotion  ProShield Protective Hand Cream  Provon moisturizing lotion    How to Use an Incentive Spirometer  An incentive spirometer is a tool that measures how well you are filling your lungs with each breath. Learning to take long, deep breaths using this tool can help you keep your lungs clear and active. This may help to reverse or lessen your chance of developing breathing (pulmonary) problems, especially infection. You may be asked to use a spirometer: After a surgery. If you have a lung problem or a history of smoking. After a long period of time when you have been unable to move or be active. If the spirometer includes an indicator to show the highest number that you have reached, your health care provider or respiratory therapist will help you set a goal. Keep a log of  your progress as told by your health care provider. What are the risks? Breathing too quickly may cause dizziness or cause you to pass out. Take your time so you do not get dizzy or light-headed. If you are in pain, you may need to take pain medicine before doing incentive spirometry. It is harder to take a deep breath if you are having pain. How to use your incentive spirometer  Sit up on the edge of your bed or on a chair. Hold the incentive spirometer so that it is in an upright position. Before you use the spirometer,  breathe out normally. Place the mouthpiece in your mouth. Make sure your lips are closed tightly around it. Breathe in slowly and as deeply as you can through your mouth, causing the piston or the ball to rise toward the top of the chamber. Hold your breath for 3-5 seconds, or for as long as possible. If the spirometer includes a coach indicator, use this to guide you in breathing. Slow down your breathing if the indicator goes above the marked areas. Remove the mouthpiece from your mouth and breathe out normally. The piston or ball will return to the bottom of the chamber. Rest for a few seconds, then repeat the steps 10 or more times. Take your time and take a few normal breaths between deep breaths so that you do not get dizzy or light-headed. Do this every 1-2 hours when you are awake. If the spirometer includes a goal marker to show the highest number you have reached (best effort), use this as a goal to work toward during each repetition. After each set of 10 deep breaths, cough a few times. This will help to make sure that your lungs are clear. If you have an incision on your chest or abdomen from surgery, place a pillow or a rolled-up towel firmly against the incision when you cough. This can help to reduce pain while taking deep breaths and coughing. General tips When you are able to get out of bed: Walk around often. Continue to take deep breaths and cough in order  to clear your lungs. Keep using the incentive spirometer until your health care provider says it is okay to stop using it. If you have been in the hospital, you may be told to keep using the spirometer at home. Contact a health care provider if: You are having difficulty using the spirometer. You have trouble using the spirometer as often as instructed. Your pain medicine is not giving enough relief for you to use the spirometer as told. You have a fever. Get help right away if: You develop shortness of breath. You develop a cough with bloody mucus from the lungs. You have fluid or blood coming from an incision site after you cough. Summary An incentive spirometer is a tool that can help you learn to take long, deep breaths to keep your lungs clear and active. You may be asked to use a spirometer after a surgery, if you have a lung problem or a history of smoking, or if you have been inactive for a long period of time. Use your incentive spirometer as instructed every 1-2 hours while you are awake. If you have an incision on your chest or abdomen, place a pillow or a rolled-up towel firmly against your incision when you cough. This will help to reduce pain. Get help right away if you have shortness of breath, you cough up bloody mucus, or blood comes from your incision when you cough. This information is not intended to replace advice given to you by your health care provider. Make sure you discuss any questions you have with your health care provider. Document Revised: 03/02/2020 Document Reviewed: 03/02/2020 Elsevier Patient Education  2023 Elsevier Inc.             Preoperative Educational Videos for Total Hip, Knee and Shoulder Replacements  To better prepare for surgery, please view our videos that explain the physical activity and discharge planning required to have the best surgical recovery at [facility].  [email or web address]  Questions? Call  [phone] or email [email or web address]

## 2024-05-31 NOTE — Progress Notes (Signed)
  Perioperative Services  Abnormal Lab Notification and Treatment Plan of Care  Date: 05/31/24  Name: Monica Hardin MRN:   [id number]  Re: Abnormal labs noted during PAT appointment  Provider Notified: Arlyne Lame, MD Notification mode: Routed and/or faxed via [facility]  Labs of concern: Lab Results  Component Value Date   STAPHAUREUS POSITIVE (A) 05/31/2024   MRSAPCR POSITIVE (A) 05/31/2024    Notes: Patient is scheduled for a ARTHROPLASTY, KNEE, TOTAL, USING IMAGELESS COMPUTER-ASSISTED NAVIGATION (Right: Knee) on 06/10/2024. She is scheduled to receive CEFAZOLIN  pre-operatively. Pre-surgical PCR (+) for MRSA; see above.  PLANS:  Monica Hardin is scheduled for the above procedure with Dr. Alveria Johann, MD.  Preoperative testing revealed a (+) MRSA PCR screen. Patient will need additional preoperative antimicrobial coverage for this pathogen.  Sending result to primary attending surgeon for review.  Surgeon to place further orders as deemed appropriate for this case.  Renate Caroline, MSN, APRN, FNP-C, CEN [facility]  Perioperative Services Nurse Practitioner Phone: [phone] 05/31/24 3:03 PM

## 2024-06-09 ENCOUNTER — Encounter: Payer: Self-pay | Admitting: Orthopedic Surgery

## 2024-06-10 ENCOUNTER — Observation Stay

## 2024-06-10 ENCOUNTER — Ambulatory Visit: Payer: Self-pay | Admitting: Urgent Care

## 2024-06-10 ENCOUNTER — Encounter: Payer: Self-pay | Admitting: Orthopedic Surgery

## 2024-06-10 ENCOUNTER — Other Ambulatory Visit: Payer: Self-pay

## 2024-06-10 ENCOUNTER — Ambulatory Visit: Admitting: Certified Registered Nurse Anesthetist

## 2024-06-10 ENCOUNTER — Observation Stay
Admission: RE | Admit: 2024-06-10 | Discharge: 2024-06-11 | Disposition: A | Discharge status: Home / Self Care | Source: Ambulatory Visit | Attending: Orthopedic Surgery | Admitting: Orthopedic Surgery

## 2024-06-10 ENCOUNTER — Encounter
Admission: RE | Disposition: A | Discharge status: Home / Self Care | Payer: Self-pay | Source: Ambulatory Visit | Attending: Orthopedic Surgery

## 2024-06-10 DIAGNOSIS — Z96651 Presence of right artificial knee joint: Secondary | ICD-10-CM

## 2024-06-10 DIAGNOSIS — Z79899 Other long term (current) drug therapy: Secondary | ICD-10-CM | POA: Diagnosis not present

## 2024-06-10 DIAGNOSIS — M48062 Spinal stenosis, lumbar region with neurogenic claudication: Secondary | ICD-10-CM

## 2024-06-10 DIAGNOSIS — E039 Hypothyroidism, unspecified: Secondary | ICD-10-CM | POA: Insufficient documentation

## 2024-06-10 DIAGNOSIS — M1711 Unilateral primary osteoarthritis, right knee: Principal | ICD-10-CM | POA: Insufficient documentation

## 2024-06-10 DIAGNOSIS — Z7984 Long term (current) use of oral hypoglycemic drugs: Secondary | ICD-10-CM | POA: Insufficient documentation

## 2024-06-10 DIAGNOSIS — R0600 Dyspnea, unspecified: Secondary | ICD-10-CM

## 2024-06-10 DIAGNOSIS — E559 Vitamin D deficiency, unspecified: Secondary | ICD-10-CM

## 2024-06-10 DIAGNOSIS — G9332 Myalgic encephalomyelitis/chronic fatigue syndrome: Secondary | ICD-10-CM

## 2024-06-10 DIAGNOSIS — D8989 Other specified disorders involving the immune mechanism, not elsewhere classified: Secondary | ICD-10-CM

## 2024-06-10 DIAGNOSIS — R7303 Prediabetes: Secondary | ICD-10-CM

## 2024-06-10 DIAGNOSIS — R7989 Other specified abnormal findings of blood chemistry: Secondary | ICD-10-CM

## 2024-06-10 HISTORY — PX: KNEE ARTHROPLASTY: SHX992

## 2024-06-10 LAB — GLUCOSE, CAPILLARY
Glucose-Capillary: 143 mg/dL — ABNORMAL HIGH (ref 70–99)
Glucose-Capillary: 261 mg/dL — ABNORMAL HIGH (ref 70–99)
Glucose-Capillary: 120 mg/dL — ABNORMAL HIGH (ref 70–99)

## 2024-06-10 SURGERY — ARTHROPLASTY, KNEE, TOTAL, USING IMAGELESS COMPUTER-ASSISTED NAVIGATION
Anesthesia: Spinal | Site: Knee | Laterality: Right

## 2024-06-10 MED ORDER — FERROUS SULFATE 325 (65 FE) MG PO TABS
325.0000 mg | ORAL_TABLET | Freq: Two times a day (BID) | ORAL | Status: DC
  Administered 2024-06-11: 325 mg via ORAL
  Filled 2024-06-10: qty 1

## 2024-06-10 MED ORDER — TRANEXAMIC ACID-NACL 1000-0.7 MG/100ML-% IV SOLN
INTRAVENOUS | Status: AC
  Filled 2024-06-10: qty 100

## 2024-06-10 MED ORDER — GLYCOPYRROLATE 0.2 MG/ML IJ SOLN
INTRAMUSCULAR | Status: AC
Start: 2024-06-10 — End: 2024-06-10
  Filled 2024-06-10: qty 1

## 2024-06-10 MED ORDER — TRANEXAMIC ACID-NACL 1000-0.7 MG/100ML-% IV SOLN
1000.0000 mg | Freq: Once | INTRAVENOUS | Status: AC
  Administered 2024-06-10: 1000 mg via INTRAVENOUS

## 2024-06-10 MED ORDER — CELECOXIB 200 MG PO CAPS
ORAL_CAPSULE | ORAL | Status: AC
  Filled 2024-06-10: qty 2

## 2024-06-10 MED ORDER — PREGABALIN 75 MG PO CAPS
150.0000 mg | ORAL_CAPSULE | Freq: Every day | ORAL | Status: DC
  Administered 2024-06-10: 150 mg via ORAL
  Filled 2024-06-10: qty 2

## 2024-06-10 MED ORDER — ONDANSETRON HCL 4 MG PO TABS: 4.0000 mg | ORAL_TABLET | Freq: Four times a day (QID) | ORAL | Status: DC | PRN

## 2024-06-10 MED ORDER — MIDAZOLAM HCL 5 MG/5ML IJ SOLN
INTRAMUSCULAR | Status: DC | PRN
  Administered 2024-06-10: 2 mg via INTRAVENOUS

## 2024-06-10 MED ORDER — ALUM & MAG HYDROXIDE-SIMETH 200-200-20 MG/5ML PO SUSP: 30.0000 mL | ORAL | Status: DC | PRN

## 2024-06-10 MED ORDER — FLEET ENEMA RE ENEM: 1.0000 | ENEMA | Freq: Once | RECTAL | Status: DC | PRN

## 2024-06-10 MED ORDER — PROPOFOL 1000 MG/100ML IV EMUL
INTRAVENOUS | Status: AC
  Filled 2024-06-10: qty 100

## 2024-06-10 MED ORDER — ACETAMINOPHEN 10 MG/ML IV SOLN
INTRAVENOUS | Status: AC
  Filled 2024-06-10: qty 100

## 2024-06-10 MED ORDER — SODIUM CHLORIDE 0.9 % IR SOLN
Status: DC | PRN
  Administered 2024-06-10: 3000 mL

## 2024-06-10 MED ORDER — DIPHENHYDRAMINE HCL 12.5 MG/5ML PO ELIX: 12.5000 mg | ORAL_SOLUTION | ORAL | Status: DC | PRN

## 2024-06-10 MED ORDER — VANCOMYCIN HCL IN DEXTROSE 1-5 GM/200ML-% IV SOLN
1000.0000 mg | Freq: Once | INTRAVENOUS | Status: AC
  Administered 2024-06-10: 1000 mg via INTRAVENOUS

## 2024-06-10 MED ORDER — TRAMADOL HCL 50 MG PO TABS
50.0000 mg | ORAL_TABLET | ORAL | Status: DC | PRN
  Administered 2024-06-10: 50 mg via ORAL
  Filled 2024-06-10: qty 1

## 2024-06-10 MED ORDER — GABAPENTIN 300 MG PO CAPS
ORAL_CAPSULE | ORAL | Status: AC
  Filled 2024-06-10: qty 1

## 2024-06-10 MED ORDER — MAGNESIUM HYDROXIDE 400 MG/5ML PO SUSP
30.0000 mL | Freq: Every day | ORAL | Status: DC
  Filled 2024-06-10: qty 30

## 2024-06-10 MED ORDER — BUPIVACAINE HCL (PF) 0.25 % IJ SOLN
INTRAMUSCULAR | Status: DC | PRN
  Administered 2024-06-10: 60 mL

## 2024-06-10 MED ORDER — ACETAMINOPHEN 10 MG/ML IV SOLN
INTRAVENOUS | Status: DC | PRN
  Administered 2024-06-10: 1000 mg via INTRAVENOUS

## 2024-06-10 MED ORDER — FLUTICASONE PROPIONATE 50 MCG/ACT NA SUSP: 2.0000 | Freq: Every day | NASAL | Status: DC | PRN

## 2024-06-10 MED ORDER — LEVOTHYROXINE SODIUM 75 MCG PO TABS
75.0000 ug | ORAL_TABLET | Freq: Every day | ORAL | Status: DC
  Administered 2024-06-11: 75 ug via ORAL
  Filled 2024-06-10: qty 1
  Filled 2024-06-10: qty 3

## 2024-06-10 MED ORDER — PREGABALIN 75 MG PO CAPS
75.0000 mg | ORAL_CAPSULE | Freq: Two times a day (BID) | ORAL | Status: DC
  Administered 2024-06-10 – 2024-06-11 (×2): 75 mg via ORAL
  Filled 2024-06-10 (×2): qty 1

## 2024-06-10 MED ORDER — ACETAMINOPHEN 325 MG PO TABS: 325.0000 mg | ORAL_TABLET | Freq: Four times a day (QID) | ORAL | Status: DC | PRN

## 2024-06-10 MED ORDER — SURGIPHOR WOUND IRRIGATION SYSTEM - OPTIME
TOPICAL | Status: DC | PRN
  Administered 2024-06-10: 450 mL via TOPICAL

## 2024-06-10 MED ORDER — DEXAMETHASONE SODIUM PHOSPHATE 10 MG/ML IJ SOLN
INTRAMUSCULAR | Status: AC
  Filled 2024-06-10: qty 1

## 2024-06-10 MED ORDER — CHLORHEXIDINE GLUCONATE 4 % EX SOLN: 1.0000 | CUTANEOUS | 1 refills | Status: DC

## 2024-06-10 MED ORDER — OXYCODONE HCL 5 MG/5ML PO SOLN: 5.0000 mg | Freq: Once | ORAL | Status: DC | PRN

## 2024-06-10 MED ORDER — FLUTICASONE FUROATE-VILANTEROL 200-25 MCG/ACT IN AEPB
1.0000 | INHALATION_SPRAY | Freq: Every day | RESPIRATORY_TRACT | Status: DC
  Administered 2024-06-11: 1 via RESPIRATORY_TRACT
  Filled 2024-06-10: qty 28

## 2024-06-10 MED ORDER — PHENYLEPHRINE HCL-NACL 20-0.9 MG/250ML-% IV SOLN
INTRAVENOUS | Status: AC
  Filled 2024-06-10: qty 250

## 2024-06-10 MED ORDER — CEFAZOLIN SODIUM-DEXTROSE 2-4 GM/100ML-% IV SOLN
INTRAVENOUS | Status: AC
  Filled 2024-06-10: qty 100

## 2024-06-10 MED ORDER — INSULIN ASPART 100 UNIT/ML IJ SOLN: 0.0000 [IU] | Freq: Every day | INTRAMUSCULAR | Status: DC

## 2024-06-10 MED ORDER — INSULIN ASPART 100 UNIT/ML IJ SOLN: 0.0000 [IU] | Freq: Three times a day (TID) | INTRAMUSCULAR | Status: DC

## 2024-06-10 MED ORDER — CHLORHEXIDINE GLUCONATE 0.12 % MT SOLN
OROMUCOSAL | Status: AC
  Filled 2024-06-10: qty 15

## 2024-06-10 MED ORDER — GABAPENTIN 300 MG PO CAPS
300.0000 mg | ORAL_CAPSULE | Freq: Once | ORAL | Status: AC
  Administered 2024-06-10: 300 mg via ORAL

## 2024-06-10 MED ORDER — METOCLOPRAMIDE HCL 10 MG PO TABS
10.0000 mg | ORAL_TABLET | Freq: Three times a day (TID) | ORAL | Status: DC
  Administered 2024-06-10 – 2024-06-11 (×3): 10 mg via ORAL
  Filled 2024-06-10 (×3): qty 1

## 2024-06-10 MED ORDER — LACTATED RINGERS IV SOLN: INTRAVENOUS | Status: DC

## 2024-06-10 MED ORDER — LORATADINE 10 MG PO TABS
10.0000 mg | ORAL_TABLET | Freq: Every day | ORAL | Status: DC
  Administered 2024-06-10 – 2024-06-11 (×2): 10 mg via ORAL
  Filled 2024-06-10 (×2): qty 1

## 2024-06-10 MED ORDER — ONDANSETRON HCL 4 MG/2ML IJ SOLN
INTRAMUSCULAR | Status: AC
  Filled 2024-06-10: qty 2

## 2024-06-10 MED ORDER — ASPIRIN 81 MG PO CHEW
81.0000 mg | CHEWABLE_TABLET | Freq: Two times a day (BID) | ORAL | Status: DC
  Administered 2024-06-10 – 2024-06-11 (×2): 81 mg via ORAL
  Filled 2024-06-10 (×2): qty 1

## 2024-06-10 MED ORDER — MUPIROCIN 2 % EX OINT: 1.0000 | TOPICAL_OINTMENT | Freq: Two times a day (BID) | CUTANEOUS | 0 refills | Status: AC

## 2024-06-10 MED ORDER — TRANEXAMIC ACID-NACL 1000-0.7 MG/100ML-% IV SOLN
1000.0000 mg | INTRAVENOUS | Status: AC
  Administered 2024-06-10: 1000 mg via INTRAVENOUS

## 2024-06-10 MED ORDER — METFORMIN HCL ER 500 MG PO TB24
500.0000 mg | ORAL_TABLET | Freq: Every day | ORAL | Status: DC
  Administered 2024-06-11: 500 mg via ORAL

## 2024-06-10 MED ORDER — ONDANSETRON HCL 4 MG/2ML IJ SOLN
INTRAMUSCULAR | Status: DC | PRN
  Administered 2024-06-10: 4 mg via INTRAVENOUS

## 2024-06-10 MED ORDER — AZELASTINE HCL 0.1 % NA SOLN: 1.0000 | Freq: Two times a day (BID) | NASAL | Status: DC | PRN

## 2024-06-10 MED ORDER — PHENYLEPHRINE HCL-NACL 20-0.9 MG/250ML-% IV SOLN
INTRAVENOUS | Status: DC | PRN
  Administered 2024-06-10: 25 ug/min via INTRAVENOUS

## 2024-06-10 MED ORDER — VANCOMYCIN HCL IN DEXTROSE 1-5 GM/200ML-% IV SOLN
INTRAVENOUS | Status: AC
  Filled 2024-06-10: qty 200

## 2024-06-10 MED ORDER — PANTOPRAZOLE SODIUM 40 MG PO TBEC
40.0000 mg | DELAYED_RELEASE_TABLET | Freq: Two times a day (BID) | ORAL | Status: DC
Start: 2024-06-10 — End: 2024-06-11
  Administered 2024-06-10 – 2024-06-11 (×3): 40 mg via ORAL
  Filled 2024-06-10 (×3): qty 1

## 2024-06-10 MED ORDER — CHLORHEXIDINE GLUCONATE 4 % EX SOLN
60.0000 mL | Freq: Once | CUTANEOUS | Status: DC
  Administered 2024-06-10: 4 via TOPICAL

## 2024-06-10 MED ORDER — BUPIVACAINE HCL (PF) 0.5 % IJ SOLN
INTRAMUSCULAR | Status: DC | PRN
  Administered 2024-06-10: 3 mL

## 2024-06-10 MED ORDER — SODIUM CHLORIDE 0.9 % IV SOLN: INTRAVENOUS | Status: DC

## 2024-06-10 MED ORDER — PROPOFOL 500 MG/50ML IV EMUL
INTRAVENOUS | Status: DC | PRN
  Administered 2024-06-10: 75 ug/kg/min via INTRAVENOUS

## 2024-06-10 MED ORDER — BUPIVACAINE HCL (PF) 0.5 % IJ SOLN
INTRAMUSCULAR | Status: AC
  Filled 2024-06-10: qty 10

## 2024-06-10 MED ORDER — OXYCODONE HCL 5 MG PO TABS
5.0000 mg | ORAL_TABLET | ORAL | Status: DC | PRN
  Administered 2024-06-10: 5 mg via ORAL
  Filled 2024-06-10: qty 1

## 2024-06-10 MED ORDER — TRAZODONE HCL 100 MG PO TABS
100.0000 mg | ORAL_TABLET | Freq: Every day | ORAL | Status: DC
  Administered 2024-06-10: 100 mg via ORAL
  Filled 2024-06-10: qty 1

## 2024-06-10 MED ORDER — CHLORHEXIDINE GLUCONATE 0.12 % MT SOLN
15.0000 mL | Freq: Once | OROMUCOSAL | Status: AC
  Administered 2024-06-10: 15 mL via OROMUCOSAL

## 2024-06-10 MED ORDER — DULOXETINE HCL 60 MG PO CPEP
60.0000 mg | ORAL_CAPSULE | Freq: Every day | ORAL | Status: DC
  Administered 2024-06-10 – 2024-06-11 (×2): 60 mg via ORAL
  Filled 2024-06-10: qty 1
  Filled 2024-06-10 (×2): qty 2
  Filled 2024-06-10: qty 1

## 2024-06-10 MED ORDER — FENTANYL CITRATE (PF) 100 MCG/2ML IJ SOLN
INTRAMUSCULAR | Status: AC
Start: 2024-06-10 — End: 2024-06-10
  Filled 2024-06-10: qty 2

## 2024-06-10 MED ORDER — ACETAMINOPHEN 10 MG/ML IV SOLN
1000.0000 mg | Freq: Four times a day (QID) | INTRAVENOUS | Status: AC
  Administered 2024-06-10 – 2024-06-11 (×3): 1000 mg via INTRAVENOUS
  Filled 2024-06-10 (×3): qty 100

## 2024-06-10 MED ORDER — HYDROMORPHONE HCL 1 MG/ML IJ SOLN: 0.5000 mg | INTRAMUSCULAR | Status: DC | PRN

## 2024-06-10 MED ORDER — OXYCODONE HCL 5 MG PO TABS: 10.0000 mg | ORAL_TABLET | ORAL | Status: DC | PRN

## 2024-06-10 MED ORDER — OXYCODONE HCL 5 MG PO TABS: 5.0000 mg | ORAL_TABLET | Freq: Once | ORAL | Status: DC | PRN

## 2024-06-10 MED ORDER — MAGNESIUM OXIDE -MG SUPPLEMENT 400 (240 MG) MG PO TABS: 400.0000 mg | ORAL_TABLET | Freq: Every day | ORAL | Status: DC

## 2024-06-10 MED ORDER — MIDAZOLAM HCL 2 MG/2ML IJ SOLN
INTRAMUSCULAR | Status: AC
  Filled 2024-06-10: qty 2

## 2024-06-10 MED ORDER — FENTANYL CITRATE (PF) 100 MCG/2ML IJ SOLN: 25.0000 ug | INTRAMUSCULAR | Status: DC | PRN

## 2024-06-10 MED ORDER — CEFAZOLIN SODIUM-DEXTROSE 2-4 GM/100ML-% IV SOLN
2.0000 g | Freq: Four times a day (QID) | INTRAVENOUS | Status: AC
  Administered 2024-06-10 (×2): 2 g via INTRAVENOUS
  Filled 2024-06-10 (×2): qty 100

## 2024-06-10 MED ORDER — CELECOXIB 200 MG PO CAPS
400.0000 mg | ORAL_CAPSULE | Freq: Once | ORAL | Status: AC
  Administered 2024-06-10: 400 mg via ORAL

## 2024-06-10 MED ORDER — CELECOXIB 200 MG PO CAPS
200.0000 mg | ORAL_CAPSULE | Freq: Two times a day (BID) | ORAL | Status: DC
Start: 2024-06-10 — End: 2024-06-11
  Administered 2024-06-10 – 2024-06-11 (×3): 200 mg via ORAL
  Filled 2024-06-10 (×3): qty 1

## 2024-06-10 MED ORDER — LIOTHYRONINE SODIUM 5 MCG PO TABS
5.0000 ug | ORAL_TABLET | Freq: Every day | ORAL | Status: DC
  Administered 2024-06-11: 5 ug via ORAL
  Filled 2024-06-10: qty 1

## 2024-06-10 MED ORDER — ONDANSETRON HCL 4 MG/2ML IJ SOLN: 4.0000 mg | Freq: Four times a day (QID) | INTRAMUSCULAR | Status: DC | PRN

## 2024-06-10 MED ORDER — PREGABALIN 75 MG PO CAPS: 75.0000 mg | ORAL_CAPSULE | Freq: Three times a day (TID) | ORAL | Status: DC

## 2024-06-10 MED ORDER — DROPERIDOL 2.5 MG/ML IJ SOLN: 0.6250 mg | Freq: Once | INTRAMUSCULAR | Status: DC | PRN

## 2024-06-10 MED ORDER — PHENOL 1.4 % MT LIQD: 1.0000 | OROMUCOSAL | Status: DC | PRN

## 2024-06-10 MED ORDER — CEFAZOLIN SODIUM-DEXTROSE 2-4 GM/100ML-% IV SOLN
2.0000 g | INTRAVENOUS | Status: AC
  Administered 2024-06-10: 2 g via INTRAVENOUS

## 2024-06-10 MED ORDER — BISACODYL 10 MG RE SUPP: 10.0000 mg | Freq: Every day | RECTAL | Status: DC | PRN

## 2024-06-10 MED ORDER — MENTHOL 3 MG MT LOZG: 1.0000 | LOZENGE | OROMUCOSAL | Status: DC | PRN

## 2024-06-10 MED ORDER — ORAL CARE MOUTH RINSE: 15.0000 mL | Freq: Once | OROMUCOSAL | Status: AC

## 2024-06-10 MED ORDER — DEXAMETHASONE SODIUM PHOSPHATE 10 MG/ML IJ SOLN
8.0000 mg | Freq: Once | INTRAMUSCULAR | Status: AC
  Administered 2024-06-10: 8 mg via INTRAVENOUS

## 2024-06-10 MED ORDER — SENNOSIDES-DOCUSATE SODIUM 8.6-50 MG PO TABS
1.0000 | ORAL_TABLET | Freq: Two times a day (BID) | ORAL | Status: DC
  Administered 2024-06-10 – 2024-06-11 (×2): 1 via ORAL
  Filled 2024-06-10 (×2): qty 1

## 2024-06-10 MED ORDER — ACETAMINOPHEN 10 MG/ML IV SOLN
1000.0000 mg | Freq: Once | INTRAVENOUS | Status: DC | PRN
Start: 2024-06-10 — End: 2024-06-10

## 2024-06-10 MED ORDER — FENTANYL CITRATE (PF) 100 MCG/2ML IJ SOLN
INTRAMUSCULAR | Status: DC | PRN
  Administered 2024-06-10: 50 ug via INTRAVENOUS
  Administered 2024-06-10 (×2): 25 ug via INTRAVENOUS

## 2024-06-10 MED ORDER — SODIUM CHLORIDE 0.9 % IV SOLN
INTRAVENOUS | Status: DC | PRN
  Administered 2024-06-10: 60 mL

## 2024-06-10 SURGICAL SUPPLY — 64 items
ATTUNE PS FEM RT SZ 4 CEM KNEE (Femur) IMPLANT
ATTUNE PSRP INSR SZ4 5 KNEE (Insert) IMPLANT
BASEPLATE TIBIAL ROTATING SZ 4 (Knees) IMPLANT
BATTERY INSTRU NAVIGATION (MISCELLANEOUS) ×8 IMPLANT
BIT DRILL QUICK REL 1/8 2PK SL (BIT) ×2 IMPLANT
BLADE CLIPPER SURG (BLADE) IMPLANT
BLADE SAW 70X12.5 (BLADE) ×2 IMPLANT
BLADE SAW 90X13X1.19 OSCILLAT (BLADE) ×2 IMPLANT
BLADE SAW 90X25X1.19 OSCILLAT (BLADE) ×2 IMPLANT
BRUSH SCRUB EZ PLAIN DRY (MISCELLANEOUS) ×2 IMPLANT
CEMENT BONE GENTAMICIN 40 (Cement) IMPLANT
COOLER ICEMAN CLASSIC (MISCELLANEOUS) ×2 IMPLANT
CUFF TRNQT CYL 24X4X16.5-23 (TOURNIQUET CUFF) IMPLANT
CUFF TRNQT CYL 30X4X21-28X (TOURNIQUET CUFF) IMPLANT
DRAPE SHEET LG 3/4 BI-LAMINATE (DRAPES) ×2 IMPLANT
DRSG AQUACEL AG ADV 3.5X14 (GAUZE/BANDAGES/DRESSINGS) ×2 IMPLANT
DRSG MEPILEX SACRM 8.7X9.8 (GAUZE/BANDAGES/DRESSINGS) ×2 IMPLANT
DRSG TEGADERM 4X4.75 (GAUZE/BANDAGES/DRESSINGS) ×2 IMPLANT
DRSG XEROFORM 1X8 (GAUZE/BANDAGES/DRESSINGS) IMPLANT
DURAPREP 26ML APPLICATOR (WOUND CARE) ×4 IMPLANT
ELECT CAUTERY BLADE 6.4 (BLADE) ×2 IMPLANT
ELECTRODE REM PT RTRN 9FT ADLT (ELECTROSURGICAL) ×2 IMPLANT
EVACUATOR 1/8 PVC DRAIN (DRAIN) ×2 IMPLANT
EX-PIN ORTHOLOCK NAV 4X150 (PIN) ×4 IMPLANT
GAUZE XEROFORM 1X8 LF (GAUZE/BANDAGES/DRESSINGS) ×2 IMPLANT
GLOVE BIOGEL M STRL SZ7.5 (GLOVE) ×12 IMPLANT
GLOVE BIOGEL PI IND STRL 8 (GLOVE) ×2 IMPLANT
GLOVE SRG 8 PF TXTR STRL LF DI (GLOVE) ×2 IMPLANT
GOWN STRL REUS W/ TWL LRG LVL3 (GOWN DISPOSABLE) ×2 IMPLANT
GOWN STRL REUS W/ TWL XL LVL3 (GOWN DISPOSABLE) ×2 IMPLANT
GOWN TOGA ZIPPER T7+ PEEL AWAY (MISCELLANEOUS) ×2 IMPLANT
HOLDER FOLEY CATH W/STRAP (MISCELLANEOUS) ×2 IMPLANT
HOOD PEEL AWAY T7 (MISCELLANEOUS) ×2 IMPLANT
KIT TURNOVER KIT A (KITS) ×2 IMPLANT
KNIFE SCULPS 14X20 (INSTRUMENTS) ×2 IMPLANT
MANIFOLD NEPTUNE II (INSTRUMENTS) ×4 IMPLANT
NDL SPNL 20GX3.5 QUINCKE YW (NEEDLE) ×4 IMPLANT
NEEDLE SPNL 20GX3.5 QUINCKE YW (NEEDLE) ×2 IMPLANT
PACK TOTAL KNEE (MISCELLANEOUS) ×2 IMPLANT
PAD ABD DERMACEA PRESS 5X9 (GAUZE/BANDAGES/DRESSINGS) ×4 IMPLANT
PAD ARMBOARD POSITIONER FOAM (MISCELLANEOUS) ×6 IMPLANT
PAD COLD UNI WRAP-ON (PAD) ×2 IMPLANT
PATELLA MEDIAL ATTUN 35MM KNEE (Knees) IMPLANT
PENCIL SMOKE EVACUATOR COATED (MISCELLANEOUS) ×2 IMPLANT
PIN DRILL FIX HALF THREAD (BIT) ×4 IMPLANT
PIN FIXATION 1/8DIA X 3INL (PIN) ×2 IMPLANT
SOL .9 NS 3000ML IRR UROMATIC (IV SOLUTION) ×2 IMPLANT
SOLUTION IRRIG SURGIPHOR (IV SOLUTION) ×2 IMPLANT
SPONGE DRAIN TRACH 4X4 STRL 2S (GAUZE/BANDAGES/DRESSINGS) ×2 IMPLANT
STAPLER SKIN PROX 35W (STAPLE) ×2 IMPLANT
STOCKINETTE IMPERV 14X48 (MISCELLANEOUS) ×2 IMPLANT
STOCKINETTE STRL BIAS CUT 8X4 (MISCELLANEOUS) ×2 IMPLANT
STRAP TIBIA SHORT (MISCELLANEOUS) ×2 IMPLANT
SUCTION TUBE FRAZIER 10FR DISP (SUCTIONS) ×2 IMPLANT
SUT VIC AB 0 CT1 36 (SUTURE) ×2 IMPLANT
SUT VIC AB 1 CT1 36 (SUTURE) ×4 IMPLANT
SUT VIC AB 2-0 CT2 27 (SUTURE) ×2 IMPLANT
SYR 30ML LL (SYRINGE) ×4 IMPLANT
TIP FAN IRRIG PULSAVAC PLUS (DISPOSABLE) ×2 IMPLANT
TOWEL OR 17X26 4PK STRL BLUE (TOWEL DISPOSABLE) IMPLANT
TOWER CARTRIDGE SMART MIX (DISPOSABLE) ×2 IMPLANT
TRAP FLUID SMOKE EVACUATOR (MISCELLANEOUS) ×2 IMPLANT
TRAY FOLEY MTR SLVR 16FR STAT (SET/KITS/TRAYS/PACK) ×2 IMPLANT
WATER STERILE IRR 1000ML POUR (IV SOLUTION) ×2 IMPLANT

## 2024-06-10 NOTE — Transfer of Care (Signed)
 Immediate Anesthesia Transfer of Care Note  Patient: Monica Hardin  Procedure(s) Performed: ARTHROPLASTY, KNEE, TOTAL, USING IMAGELESS COMPUTER-ASSISTED NAVIGATION (Right: Knee)  Patient Location: PACU  Anesthesia Type:Spinal  Level of Consciousness: awake and alert   Airway & Oxygen Therapy: Patient Spontanous Breathing and Patient connected to face mask oxygen  Post-op Assessment: Report given to RN and Post -op Vital signs reviewed and stable  Post vital signs: Reviewed and stable  Last Vitals:  Vitals Value Taken Time  BP 117/70 06/10/24 10:45  Temp    Pulse 94 06/10/24 10:48  Resp 16 06/10/24 10:48  SpO2 98 % 06/10/24 10:48  Vitals shown include unfiled device data.  Last Pain:  Vitals:   06/10/24 0612  TempSrc: Temporal  PainSc: 5          Complications: No notable events documented.

## 2024-06-10 NOTE — Anesthesia Preprocedure Evaluation (Signed)
 Anesthesia Evaluation  Patient identified by MRN, date of birth, ID band Patient awake    Reviewed: Allergy  & Precautions, H&P , NPO status , Patient's Chart, lab work & pertinent test results, reviewed documented beta blocker date and time   Airway Mallampati: II   Neck ROM: full    Dental  (+) Poor Dentition   Pulmonary shortness of breath and with exertion, asthma    Pulmonary exam normal        Cardiovascular Exercise Tolerance: Poor negative cardio ROS Normal cardiovascular exam Rhythm:regular Rate:Normal     Neuro/Psych  Neuromuscular disease  negative psych ROS   GI/Hepatic negative GI ROS, Neg liver ROS,,,  Endo/Other  Hypothyroidism    Renal/GU negative Renal ROS  negative genitourinary   Musculoskeletal   Abdominal   Peds  Hematology negative hematology ROS (+)   Anesthesia Other Findings Past Medical History: No date: Asthma No date: Cancer (HCC)     Comment:  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 05/08/2007: CFIDS (chronic fatigue and immune dysfunction syndrome)  (HCC) No date: Chronic fatigue 07/04/2015: Disturbance of skin sensation 02/27/2024: Dyspnea 06/15/2018: Fibromyalgia No date: Hyperlipidemia No date: Hypothyroidism 12/03/2014: Lipoma of arm 04/20/2016: Low vitamin B12 level 12/07/2022: Neurogenic claudication due to lumbar spinal stenosis No date: Overweight 06/15/2018: Positive ANA (antinuclear antibody) 06/15/2018: Primary osteoarthritis of both feet 06/15/2018: Primary osteoarthritis of both hands 06/15/2018: Primary osteoarthritis of both hands 06/15/2018: Primary osteoarthritis of both knees No date: Sciatica, right side No date: Thyroid  disease 02/27/2019: Varicose veins of both lower extremities 04/20/2016: Vitamin D  deficiency Past Surgical History: 10/05/2015: ABDOMINAL HYSTERECTOMY; N/A     Comment:  Procedure: HYSTERECTOMY ABDOMINAL;  Surgeon: Colan Dash, MD;   Location: [facility] ORS;  Service:               Gynecology;  Laterality: N/A; 03/27/2017: CHOLECYSTECTOMY; N/A     Comment:  Procedure: LAPAROSCOPIC CHOLECYSTECTOMY;  Surgeon: Emmalene Hare, MD;  Location: [facility] ORS;  Service: General;                Laterality: N/A; 11/22/2016: COLONOSCOPY WITH PROPOFOL ; N/A     Comment:  Procedure: COLONOSCOPY WITH PROPOFOL ;  Surgeon: Marshall Skeeter, MD;  Location: [facility] ENDOSCOPY;  Service:               Endoscopy;  Laterality: N/A; No date: FRACTURE SURGERY 08/24/2015: LAPAROSCOPY; N/A     Comment:  Procedure: LAPAROSCOPY DIAGNOSTIC;  Surgeon: Colan Dash, MD;  Location: [facility] ORS;  Service:               Gynecology;  Laterality: N/A; [id number]: LEEP     Comment:  [facility] 1996: MANDIBLE FRACTURE SURGERY 10/05/2015: SALPINGOOPHORECTOMY; Bilateral     Comment:  Procedure: SALPINGO OOPHORECTOMY;  Surgeon: Colan Dash, MD;  Location: [facility] ORS;  Service:               Gynecology;  Laterality: Bilateral; BMI    Body Mass Index: 32.77 kg/m     Reproductive/Obstetrics negative OB ROS  Anesthesia Physical Anesthesia Plan  ASA: 3  Anesthesia Plan: Spinal   Post-op Pain Management:    Induction:   PONV Risk Score and Plan: 3  Airway Management Planned:   Additional Equipment:   Intra-op Plan:   Post-operative Plan:   Informed Consent: I have reviewed the patients History and Physical, chart, labs and discussed the procedure including the risks, benefits and alternatives for the proposed anesthesia with the patient or authorized representative who has indicated his/her understanding and acceptance.     Dental Advisory Given  Plan Discussed with: CRNA  Anesthesia Plan Comments:        Anesthesia Quick Evaluation

## 2024-06-10 NOTE — Interval H&P Note (Signed)
 History and Physical Interval Note:  06/10/2024 6:09 AM  Monica Hardin  has presented today for surgery, with the diagnosis of Primary osteoarthritis of right knee.  The various methods of treatment have been discussed with the patient and family. After consideration of risks, benefits and other options for treatment, the patient has consented to  Procedure(s): ARTHROPLASTY, KNEE, TOTAL, USING IMAGELESS COMPUTER-ASSISTED NAVIGATION (Right) as a surgical intervention.  The patient's history has been reviewed, patient examined, no change in status, stable for surgery.  I have reviewed the patient's chart and labs.  Questions were answered to the patient's satisfaction.     Ikenna Ohms P Corrin Sieling

## 2024-06-10 NOTE — Progress Notes (Signed)
 Subjective: * Day of Surgery * Procedure(s) (LRB): ARTHROPLASTY, KNEE, TOTAL, USING IMAGELESS COMPUTER-ASSISTED NAVIGATION (Right) Patient reports pain as mild.   Patient seen in rounds with Dr. Aubry Blase. Patient is well, and has had no acute complaints or problems Denies any CP, SOB, N/V, fevers or chills We will start therapy today.  Plan is to go Home after hospital stay.  Objective: Vital signs in last 24 hours: Temp:  [97.5 F (36.4 C)] 97.5 F (36.4 C) (06/16 0612) Pulse Rate:  [66] 66 (06/16 0612) Resp:  [16] 16 (06/16 0612) BP: (137)/(74) 137/74 (06/16 0612) SpO2:  [96 %] 96 % (06/16 0612) Weight:  [92.1 kg] 92.1 kg (06/16 0612)  Intake/Output from previous day:  Intake/Output Summary (Last 24 hours) at 06/10/2024 1025 Last data filed at 06/10/2024 0733 Gross per 24 hour  Intake 133.33 ml  Output --  Net 133.33 ml    Intake/Output this shift: Total I/O In: 133.3 [IV Piggyback:133.3] Out: -   Labs: No results for input(s): HGB in the last 72 hours. No results for input(s): WBC, RBC, HCT, PLT in the last 72 hours. No results for input(s): NA, K, CL, CO2, BUN, CREATININE, GLUCOSE, CALCIUM  in the last 72 hours. No results for input(s): LABPT, INR in the last 72 hours.  EXAM General - Patient is Alert, Appropriate, and Oriented Extremity - Neurologically intact Neurovascular intact Sensation intact distally Intact pulses distally Dorsiflexion/Plantar flexion intact No cellulitis present Compartment soft Dressing - dressing C/D/I and no drainage Motor Function - intact, moving foot and toes well on exam. JP Drain pulled without difficulty. 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   Chronic fatigue    Disturbance of skin sensation 07/04/2015   Dyspnea 02/27/2024   Fibromyalgia 06/15/2018   Hyperlipidemia     Hypothyroidism    Lipoma of arm 12/03/2014   Low vitamin B12 level 04/20/2016   Neurogenic claudication due to lumbar spinal stenosis 12/07/2022   Overweight    Positive ANA (antinuclear antibody) 06/15/2018   Primary osteoarthritis of both feet 06/15/2018   Primary osteoarthritis of both hands 06/15/2018   Primary osteoarthritis of both hands 06/15/2018   Primary osteoarthritis of both knees 06/15/2018   Sciatica, right side    Thyroid  disease    Varicose veins of both lower extremities 02/27/2019   Vitamin D  deficiency 04/20/2016    Assessment/Plan: * Day of Surgery * Procedure(s) (LRB): ARTHROPLASTY, KNEE, TOTAL, USING IMAGELESS COMPUTER-ASSISTED NAVIGATION (Right) Active Problems:   * No active hospital problems. *  Estimated body mass index is 32.77 kg/m as calculated from the following:   Height as of this encounter: 5' 6 (1.676 m).   Weight as of this encounter: 92.1 kg. Advance diet Up with therapy  Patient will continue to work with physical therapy to pass postoperative PT protocols, ROM and strengthening  Discussed with the patient continuing to utilize Polar Care  Patient will use bone foam in 20-30 minute intervals  Patient will wear TED hose bilaterally to help prevent DVT and clot formation  Discussed the Aquacel bandage.  This bandage will stay in place 7 days postoperatively.  Can be replaced with honeycomb bandages that will be sent home with the patient  Discussed sending the patient home with tramadol and oxycodone  for as needed pain management.  Patient will also be sent home with Celebrex  to help with swelling and inflammation.  Patient will take an 81 mg aspirin twice daily for DVT prophylaxis  JP drain removed without difficulty, intact  Weight-Bearing as tolerated to right leg  Patient will follow-up with [facility] orthopedics in 2 weeks for staple removal and reevaluation  Wadie Guile, PA-C [facility]th[facility] 06/10/2024, 10:25 AM

## 2024-06-10 NOTE — Evaluation (Signed)
 Physical Therapy Evaluation Patient Details Name: Monica Hardin MRN: [id number] DOB: February 19, 1966 Today's Date: 06/10/2024  History of Present Illness  Pt is a 58 y.o. female s/p R TKA d/t degenerative arthrosis 06/10/24.  PMH includes CFIDS, fibromyalgia, OA B hands/feet, sciatica, vertigo, Vitamin B12 and D deficiency, R MOHS sx, SOB with exertion, asthma, neurogenic claudication d/t lumbar spinal stenosis.  Clinical Impression  Prior to surgery, pt reports being independent with ambulation (although limited d/t R knee impairments); lives with her husband; plans to stay on main level of home initially (has twin bed with full bath); has steps to enter home.  Currently pt is min assist with bed mobility; CGA with transfers; and CGA to ambulate short distance to/from bathroom with RW use.  Improved gait pattern noted with vc's.  Able to perform R LE SLR with minimal assist.  Pt would currently benefit from skilled PT to address noted impairments and functional limitations (see below for any additional details).  Upon hospital discharge, pt would benefit from ongoing therapy.     If plan is discharge home, recommend the following: A little help with walking and/or transfers;A little help with bathing/dressing/bathroom;Assistance with cooking/housework;Assist for transportation;Help with stairs or ramp for entrance   Can travel by private vehicle    Yes    Equipment Recommendations Rolling walker (2 wheels);BSC/3in1  Recommendations for Other Services       Functional Status Assessment Patient has had a recent decline in their functional status and demonstrates the ability to make significant improvements in function in a reasonable and predictable amount of time.     Precautions / Restrictions Precautions Precautions: Knee;Fall Precaution Booklet Issued: Yes (comment) Recall of Precautions/Restrictions: Intact Precaution/Restrictions Comments: KI if unable to SLR Restrictions Weight  Bearing Restrictions Per Provider Order: Yes RLE Weight Bearing Per Provider Order: Weight bearing as tolerated      Mobility  Bed Mobility Overal bed mobility: Needs Assistance Bed Mobility: Supine to Sit, Sit to Supine     Supine to sit: Min assist, HOB elevated Sit to supine: Min assist, HOB elevated   General bed mobility comments: assist for R LE    Transfers Overall transfer level: Needs assistance Equipment used: Rolling walker (2 wheels) Transfers: Sit to/from Stand Sit to Stand: Contact guard assist           General transfer comment: x1 trial standing from bed and x1 trial standing from [facility] over toilet; vc's for UE/LE placement/positioning    Ambulation/Gait Ambulation/Gait assistance: Contact guard assist Gait Distance (Feet):  (10 feet x2 (to/from bathroom)) Assistive device: Rolling walker (2 wheels) Gait Pattern/deviations: Step-to pattern, Decreased step length - right, Decreased step length - left, Decreased stance time - right Gait velocity: decreased     General Gait Details: antalgic; decreased stance time R LE; pt initially only putting R forefoot/toes on ground (pt reports she had been doing this prior to surgery) but improved foot flat WB'ing with vc's  Stairs            Wheelchair Mobility     Tilt Bed    Modified Rankin (Stroke Patients Only)       Balance Overall balance assessment: Needs assistance Sitting-balance support: No upper extremity supported, Feet supported Sitting balance-Leahy Scale: Good Sitting balance - Comments: steady reaching within BOS   Standing balance support: Bilateral upper extremity supported, During functional activity, Reliant on assistive device for balance Standing balance-Leahy Scale: Good Standing balance comment: steady ambulating with RW use  Pertinent Vitals/Pain Pain Assessment Pain Assessment: 0-10 Pain Score: 0-No pain (1/10 at rest beginning of  session; 0/10 at rest end of session) Pain Location: R knee Pain Descriptors / Indicators: Aching Pain Intervention(s): Limited activity within patient's tolerance, Monitored during session, Premedicated before session, Repositioned, Other (comment) (polar care applied and activated) HR 70-78 bpm and SpO2 sats 96% or greater on room air during session.    Home Living Family/patient expects to be discharged to:: Private residence Living Arrangements: Spouse/significant other Available Help at Discharge: Family Type of Home: House Home Access: Stairs to enter Entrance Stairs-Rails: None Entrance Stairs-Number of Steps: 1 very short step plus another short step   Home Layout: Two level;Full bath on main level;Able to live on main level with bedroom/bathroom (plans to stay on main level with twin bed) Home Equipment: Cane - single point Additional Comments: Has dog.    Prior Function Prior Level of Function : Independent/Modified Independent             Mobility Comments: Pt reports no recent falls.       Extremity/Trunk Assessment   Upper Extremity Assessment Upper Extremity Assessment: Overall WFL for tasks assessed    Lower Extremity Assessment Lower Extremity Assessment: RLE deficits/detail (L LE WFL) RLE Deficits / Details: minimal assistance for R LE SLR; at least 3/5 AROM DF; good R isometric quad set    Cervical / Trunk Assessment Cervical / Trunk Assessment: Normal  Communication   Communication Communication: No apparent difficulties    Cognition Arousal: Alert Behavior During Therapy: WFL for tasks assessed/performed   PT - Cognitive impairments: No apparent impairments                         Following commands: Intact       Cueing Cueing Techniques: Verbal cues     General Comments General comments (skin integrity, edema, etc.): R LE bandages and hemovac in place.  Nursing cleared pt for participation in physical therapy.  Pt agreeable  to PT session.  Pt's husband present during session.    Exercises Total Joint Exercises Ankle Circles/Pumps: AROM, Strengthening, Both, 10 reps, Supine Quad Sets: AROM, Strengthening, Both, 10 reps, Supine Heel Slides: AAROM, Strengthening, Right, 10 reps, Supine Straight Leg Raises: AAROM, Strengthening, Right, 10 reps, Supine Goniometric ROM: R knee extension 15 degrees short of neutral (semi-supine in bed); R knee flexion 62 degrees in sitting (appeared limited d/t bandages)   Assessment/Plan    PT Assessment Patient needs continued PT services  PT Problem List Decreased strength;Decreased range of motion;Decreased activity tolerance;Decreased balance;Decreased mobility;Decreased knowledge of use of DME;Decreased knowledge of precautions;Decreased skin integrity;Pain       PT Treatment Interventions DME instruction;Gait training;Stair training;Functional mobility training;Therapeutic activities;Therapeutic exercise;Balance training;Patient/family education    PT Goals (Current goals can be found in the Care Plan section)  Acute Rehab PT Goals Patient Stated Goal: to improve pain and walking PT Goal Formulation: With patient Time For Goal Achievement: 06/24/24 Potential to Achieve Goals: Good    Frequency BID     Co-evaluation               AM-PAC PT 6 Clicks Mobility  Outcome Measure Help needed turning from your back to your side while in a flat bed without using bedrails?: None Help needed moving from lying on your back to sitting on the side of a flat bed without using bedrails?: A Little Help needed moving to and from a bed  to a chair (including a wheelchair)?: A Little Help needed standing up from a chair using your arms (e.g., wheelchair or bedside chair)?: A Little Help needed to walk in hospital room?: A Little Help needed climbing 3-5 steps with a railing? : A Little 6 Click Score: 19    End of Session Equipment Utilized During Treatment: Gait  belt Activity Tolerance: Patient tolerated treatment well Patient left: in bed;with call bell/phone within reach;with bed alarm set;with family/visitor present;with SCD's reapplied;Other (comment) (R heel floating via towel roll; polar care in place) Nurse Communication: Mobility status;Precautions;Weight bearing status;Other (comment) (Pt's pain status) PT Visit Diagnosis: Other abnormalities of gait and mobility (R26.89);Muscle weakness (generalized) (M62.81);Pain Pain - Right/Left: Right Pain - part of body: Knee    Time: 1610-9604 PT Time Calculation (min) (ACUTE ONLY): 30 min   Charges:   PT Evaluation $PT Eval Low Complexity: 1 Low PT Treatments $Therapeutic Activity: 8-22 mins PT General Charges $$ ACUTE PT VISIT: 1 Visit        Amador Junes, PT 06/10/24, 5:24 PM

## 2024-06-10 NOTE — Op Note (Signed)
 OPERATIVE NOTE  DATE OF SURGERY:  06/10/2024  PATIENT NAME:  Monica Hardin   DOB: 30-Jan-1966  MRN: [id number]  PRE-OPERATIVE DIAGNOSIS: Degenerative arthrosis of the right knee, primary  POST-OPERATIVE DIAGNOSIS:  Same  PROCEDURE:  Right total knee arthroplasty using computer-assisted navigation  SURGEON:  Maxene Span. M.D.  ASSISTANT:  Benjiman Bras, PA-C (present and scrubbed throughout the case, critical for assistance with exposure, retraction, instrumentation, and closure)  ANESTHESIA: spinal  ESTIMATED BLOOD LOSS: 50 mL  FLUIDS REPLACED: 1000 mL of crystalloid  TOURNIQUET TIME: 79 minutes  DRAINS: 2 medium Hemovac drains  SOFT TISSUE RELEASES: Anterior cruciate ligament, posterior cruciate ligament, deep medial collateral ligament, patellofemoral ligament  IMPLANTS UTILIZED: DePuy Attune size 4 posterior stabilized femoral component (cemented), size 4 rotating platform tibial component (cemented), 35 mm medialized dome patella (cemented), and a 5 mm stabilized rotating platform polyethylene insert.  INDICATIONS FOR SURGERY: Monica Hardin is a 58 y.o. year old female with a long history of progressive knee pain. X-rays demonstrated severe degenerative changes in tricompartmental fashion. The patient had not seen any significant improvement despite conservative nonsurgical intervention. After discussion of the risks and benefits of surgical intervention, the patient expressed understanding of the risks benefits and agree with plans for total knee arthroplasty.   The risks, benefits, and alternatives were discussed at length including but not limited to the risks of infection, bleeding, nerve injury, stiffness, blood clots, the need for revision surgery, cardiopulmonary complications, among others, and they were willing to proceed.  PROCEDURE IN DETAIL: The patient was brought into the operating room and, after adequate spinal anesthesia was achieved, a tourniquet was  placed on the patient's upper thigh. The patient's knee and leg were cleaned and prepped with alcohol and DuraPrep and draped in the usual sterile fashion. A timeout was performed as per usual protocol. The lower extremity was exsanguinated using an Esmarch, and the tourniquet was inflated to 300 mmHg. An anterior longitudinal incision was made followed by a standard mid vastus approach. The deep fibers of the medial collateral ligament were elevated in a subperiosteal fashion off of the medial flare of the tibia so as to maintain a continuous soft tissue sleeve. The patella was subluxed laterally and the patellofemoral ligament was incised. Inspection of the knee demonstrated severe degenerative changes with full-thickness loss of articular cartilage. Osteophytes were debrided using a rongeur. Anterior and posterior cruciate ligaments were excised. Two 4.0 mm Schanz pins were inserted in the femur and into the tibia for attachment of the array of trackers used for computer-assisted navigation. Hip center was identified using a circumduction technique. Distal landmarks were mapped using the computer. The distal femur and proximal tibia were mapped using the computer. The distal femoral cutting guide was positioned using computer-assisted navigation so as to achieve a 5 distal valgus cut. The femur was sized and it was felt that a size 4 femoral component was appropriate. A size 4 femoral cutting guide was positioned and the anterior cut was performed and verified using the computer. This was followed by completion of the posterior and chamfer cuts. Femoral cutting guide for the central box was then positioned in the center box cut was performed.  Attention was then directed to the proximal tibia. Medial and lateral menisci were excised. The extramedullary tibial cutting guide was positioned using computer-assisted navigation so as to achieve a 0 varus-valgus alignment and 3 posterior slope. The cut was  performed and verified using the computer. The proximal tibia  was sized and it was felt that a size 4 tibial tray was appropriate. Tibial and femoral trials were inserted followed by insertion of a 5 mm polyethylene insert. This allowed for excellent mediolateral soft tissue balancing both in flexion and in full extension. Finally, the patella was cut and prepared so as to accommodate a 35 mm medialized dome patella. A patella trial was placed and the knee was placed through a range of motion with excellent patellar tracking appreciated. The femoral trial was removed after debridement of posterior osteophytes. The central post-hole for the tibial component was reamed followed by insertion of a keel punch. Tibial trials were then removed. Cut surfaces of bone were irrigated with copious amounts of normal saline using pulsatile lavage and then suctioned dry. Polymethylmethacrylate cement with antibiotics was prepared in the usual fashion using a vacuum mixer. Cement was applied to the cut surface of the proximal tibia as well as along the undersurface of a size 4 rotating platform tibial component. Tibial component was positioned and impacted into place. Excess cement was removed using Personal assistant. Cement was then applied to the cut surfaces of the femur as well as along the posterior flanges of the size 4 femoral component. The femoral component was positioned and impacted into place. Excess cement was removed using Personal assistant. A 5 mm polyethylene trial was inserted and the knee was brought into full extension with steady axial compression applied. Finally, cement was applied to the backside of a 35 mm medialized dome patella and the patellar component was positioned and patellar clamp applied. Excess cement was removed using Personal assistant. After adequate curing of the cement, the tourniquet was deflated after a total tourniquet time of 79 minutes. Hemostasis was achieved using electrocautery. The knee was  irrigated with copious amounts of normal saline using pulsatile lavage followed by 450 ml of Surgiphor and then suctioned dry. 20 mL of 1.3% Exparel  and 60 mL of 0.25% Marcaine  in 40 mL of normal saline was injected along the posterior capsule, medial and lateral gutters, and along the arthrotomy site. A 5 mm stabilized rotating platform polyethylene insert was inserted and the knee was placed through a range of motion with excellent mediolateral soft tissue balancing appreciated and excellent patellar tracking noted. 2 medium drains were placed in the wound bed and brought out through separate stab incisions. The medial parapatellar portion of the incision was reapproximated using interrupted sutures of #1 Vicryl. Subcutaneous tissue was approximated in layers using first #0 Vicryl followed #2-0 Vicryl. The skin was approximated with skin staples. A sterile dressing was applied.  The patient tolerated the procedure well and was transported to the recovery room in stable condition.    Monica Hardin, Jr., M.D.

## 2024-06-10 NOTE — Anesthesia Procedure Notes (Signed)
 Spinal  Patient location during procedure: OR Start time: 06/10/2024 7:18 AM End time: 06/10/2024 7:22 AM Reason for block: surgical anesthesia Staffing Performed: resident/CRNA  Resident/CRNA: Clementine Cutting, CRNA Performed by: Clementine Cutting, CRNA Authorized by: Zula Hitch, MD   Preanesthetic Checklist Completed: patient identified, IV checked, site marked, risks and benefits discussed, surgical consent, monitors and equipment checked, pre-op evaluation and timeout performed Spinal Block Patient position: sitting Prep: ChloraPrep Patient monitoring: heart rate, continuous pulse ox, blood pressure and cardiac monitor Approach: midline Location: L4-5 Injection technique: single-shot Needle Needle type: Whitacre and Introducer  Needle gauge: 25 G Needle length: 9 cm Assessment Sensory level: T10 Events: CSF return Additional Notes Sterile aseptic technique used throughout the procedure.  Negative paresthesia. Negative blood return. Positive free-flowing CSF. Expiration date of kit checked and confirmed. Patient tolerated procedure well, without complications.

## 2024-06-10 NOTE — Plan of Care (Signed)
  Problem: Coping: Goal: Ability to adjust to condition or change in health will improve Outcome: Progressing   Problem: Tissue Perfusion: Goal: Adequacy of tissue perfusion will improve Outcome: Progressing   Problem: Activity: Goal: Ability to avoid complications of mobility impairment will improve Outcome: Progressing Goal: Range of joint motion will improve Outcome: Progressing   Problem: Pain Management: Goal: Pain level will decrease with appropriate interventions Outcome: Progressing

## 2024-06-10 NOTE — Discharge Summary (Signed)
 Physician Discharge Summary  Subjective: 1 Day Post-Op Procedure(s) (LRB): ARTHROPLASTY, KNEE, TOTAL, USING IMAGELESS COMPUTER-ASSISTED NAVIGATION (Right) Patient reports pain as mild.   Patient seen in rounds with Dr. Mardee. Patient is well, and has had no acute complaints or problems Denies any CP, SOB, N/V, fevers or chills We will start therapy today.  Patient is ready to go home  Physician Discharge Summary  Patient ID: Monica Hardin MRN: [id number] DOB/AGE: 01-23-1966 58 y.o.  Admit date: 06/10/2024 Discharge date: 06/11/2024  Admission Diagnoses:  Discharge Diagnoses:  Principal Problem:   History of total knee arthroplasty, right   Discharged Condition: good  Hospital Course: Patient presented to the hospital on 06/10/2024 for an elective right total knee arthroplasty performed by Dr. Mardee. Patient was given 1g of TXA and 2g of Ancef  prior to the procedure. she tolerated the procedure well without any complications. See procedural note below for details. Postoperatively, the patient did very well. she was able to pass PT protocols on post-op day one without any issues. JP drain was removed without any difficulty and was intact. she was able to void her bladder without any difficulty. Physical exam was unremarkable. she denies any SOB, CP, N/V, fevers or chills. Vital signs are stable. Patient is stable to discharge home.  PROCEDURE:  Right total knee arthroplasty using computer-assisted navigation   SURGEON:  Lynwood SHAUNNA Mardee Mickey. M.D.   ASSISTANT:  Sidra Koyanagi, PA-C (present and scrubbed throughout the case, critical for assistance with exposure, retraction, instrumentation, and closure)   ANESTHESIA: spinal   ESTIMATED BLOOD LOSS: 50 mL   FLUIDS REPLACED: 1000 mL of crystalloid   TOURNIQUET TIME: 79 minutes   DRAINS: 2 medium Hemovac drains   SOFT TISSUE RELEASES: Anterior cruciate ligament, posterior cruciate ligament, deep medial collateral ligament,  patellofemoral ligament   IMPLANTS UTILIZED: DePuy Attune size 4 posterior stabilized femoral component (cemented), size 4 rotating platform tibial component (cemented), 35 mm medialized dome patella (cemented), and a 5 mm stabilized rotating platform polyethylene insert.  Treatments: None  Discharge Exam: Blood pressure 125/64, pulse 63, temperature 98.1 F (36.7 C), temperature source Oral, resp. rate 15, height 5' 6 (1.676 m), weight 92.1 kg, last menstrual period 09/29/2015, SpO2 95%.   Disposition: Discharge disposition: 01-Home or Self Care     Home   Allergies as of 06/11/2024       Reactions   Statins    myalgia        Medication List     TAKE these medications    aspirin  81 MG chewable tablet Chew 1 tablet (81 mg total) by mouth 2 (two) times daily.   azelastine  0.1 % nasal spray Commonly known as: ASTELIN  Place 1 spray into both nostrils 2 (two) times daily. Use in each nostril as directed   budesonide -formoterol  160-4.5 MCG/ACT inhaler Commonly known as: Symbicort  Inhale 2 puffs into the lungs 2 (two) times daily.   celecoxib  200 MG capsule Commonly known as: CELEBREX  Take 1 capsule (200 mg total) by mouth 2 (two) times daily.   cetirizine 10 MG tablet Commonly known as: ZYRTEC Take 20 mg by mouth daily.   chlorhexidine  4 % external liquid Commonly known as: HIBICLENS  Apply 15 mLs (1 Application total) topically as directed for 30 doses. Use as directed daily for 5 days every other week for 6 weeks.   DULoxetine  60 MG capsule Commonly known as: CYMBALTA  Take 1 capsule (60 mg total) by mouth daily.   fluticasone  50 MCG/ACT nasal spray Commonly  known as: FLONASE  Place 2 sprays into both nostrils daily.   levothyroxine  75 MCG tablet Commonly known as: SYNTHROID  Take 75 mcg by mouth daily before breakfast.   liothyronine  5 MCG tablet Commonly known as: CYTOMEL  Take 5 mcg by mouth daily.   Magnesium  Glycinate 100 MG Caps Take 1 capsule by  mouth daily at 12 noon.   metFORMIN  500 MG 24 hr tablet Commonly known as: GLUCOPHAGE -XR Take 1 tablet (500 mg total) by mouth daily with breakfast.   mupirocin ointment 2 % Commonly known as: BACTROBAN Place 1 Application into the nose 2 (two) times daily for 60 doses. Use as directed 2 times daily for 5 days every other week for 6 weeks.   oxyCODONE  5 MG immediate release tablet Commonly known as: Oxy IR/ROXICODONE  Take 1 tablet (5 mg total) by mouth every 4 (four) hours as needed for moderate pain (pain score 4-6) (pain score 4-6).   pregabalin  75 MG capsule Commonly known as: LYRICA  Take 1-2 capsules (75-150 mg total) by mouth 3 (three) times daily. 1 in am, 1 in pm and two at bedtime   Selenium 200 MCG Caps Take 200 mcg by mouth daily.   traMADol  50 MG tablet Commonly known as: ULTRAM  Take 1-2 tablets (50-100 mg total) by mouth every 4 (four) hours as needed for moderate pain (pain score 4-6).   traZODone  100 MG tablet Commonly known as: DESYREL  TAKE 1 TABLET(100 MG) BY MOUTH AT BEDTIME   Vitamin D  (Ergocalciferol ) 1.25 MG (50000 UNIT) Caps capsule Commonly known as: DRISDOL  Take 1 capsule (50,000 Units total) by mouth once a week.               Durable Medical Equipment  (From admission, onward)           Start     Ordered   06/10/24 1119  DME Walker rolling  Once       Question:  Patient needs a walker to treat with the following condition  Answer:  Total knee replacement status   06/10/24 1118   06/10/24 1119  DME Bedside commode  Once       Comments: Patient is not able to walk the distance required to go the bathroom, or he/she is unable to safely negotiate stairs required to access the bathroom.  A 3in1 BSC will alleviate this problem  Question:  Patient needs a bedside commode to treat with the following condition  Answer:  Total knee replacement status   06/10/24 1118            Follow-up Information     Kip Lynwood Double, PA-C Follow up  on 06/24/2024.   Specialty: Physician Assistant Why: at 1:45pm Contact information: [address] [place] [address] [phone]         Mardee Lynwood SQUIBB, MD Follow up on 07/23/2024.   Specialty: Orthopedic Surgery Why: at 10:00am Contact information: [address] [facility] [place] [address] [phone]                 Signed: Sidra Koyanagi 06/11/2024, 8:40 AM   Objective: Vital signs in last 24 hours: Temp:  [97 F (36.1 C)-98.1 F (36.7 C)] 98.1 F (36.7 C) (06/17 0700) Pulse Rate:  [60-85] 63 (06/17 0700) Resp:  [11-19] 15 (06/17 0700) BP: (102-136)/(63-73) 125/64 (06/17 0700) SpO2:  [91 %-97 %] 95 % (06/17 0700)  Intake/Output from previous day:  Intake/Output Summary (Last 24 hours) at 06/11/2024 0840 Last data filed at  06/11/2024 0410 Gross per 24 hour  Intake 861.83 ml  Output 1690 ml  Net -828.17 ml    Intake/Output this shift: No intake/output data recorded.  Labs: No results for input(s): HGB in the last 72 hours. No results for input(s): WBC, RBC, HCT, PLT in the last 72 hours. No results for input(s): NA, K, CL, CO2, BUN, CREATININE, GLUCOSE, CALCIUM  in the last 72 hours. No results for input(s): LABPT, INR in the last 72 hours.  EXAM: General - Patient is Alert, Appropriate, and Oriented Extremity - Neurologically intact Neurovascular intact Sensation intact distally Intact pulses distally Dorsiflexion/Plantar flexion intact No cellulitis present Compartment soft Dressing - dressing C/D/I and no drainage Motor Function - intact, moving foot and toes well on exam. JP Drain pulled without difficulty. 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   Chronic fatigue    Disturbance of skin sensation 07/04/2015   Dyspnea 02/27/2024   Fibromyalgia 06/15/2018   Hyperlipidemia    Hypothyroidism    Lipoma of arm 12/03/2014   Low vitamin B12 level 04/20/2016   Neurogenic claudication due to lumbar spinal stenosis 12/07/2022   Overweight    Positive ANA (antinuclear antibody) 06/15/2018   Primary osteoarthritis of both feet 06/15/2018   Primary osteoarthritis of both hands 06/15/2018   Primary osteoarthritis of both hands 06/15/2018   Primary osteoarthritis of both knees 06/15/2018   Sciatica, right side    Thyroid  disease    Varicose veins of both lower extremities 02/27/2019   Vitamin D  deficiency 04/20/2016   Principal Problem:   History of total knee arthroplasty, right  Estimated body mass index is 32.77 kg/m as calculated from the following:   Height as of this encounter: 5' 6 (1.676 m).   Weight as of this encounter: 92.1 kg.  Patient will continue to work with physical therapy  Discussed with the patient continuing to utilize Polar Care   Patient will use bone foam in 20-30 minute intervals   Patient will wear TED hose bilaterally to help prevent DVT and clot formation   Discussed the Aquacel bandage.  This bandage will stay in place 7 days postoperatively.  Can be replaced with honeycomb bandages that will be sent home with the patient   Discussed sending the patient home with tramadol  and oxycodone  for as needed pain management.  Patient will also be sent home with Celebrex  to help with swelling and inflammation.  Patient will take an 81 mg aspirin  twice daily for DVT prophylaxis   JP drain removed without difficulty, intact   Weight-Bearing as tolerated to right leg   Patient will [facility] with [facility] orthoped[facility] in 2 weeks for staple removal and reevaluation  Diet - Regular  diet Follow up - in 2 weeks Activity - WBAT Disposition - Home Condition Upon Discharge - Good DVT Prophylaxis - Aspirin  and TED hose  Fonda CHARLENA Koyanagi, PA-C Orthopaedic Surgery 06/11/2024, 8:40 AM

## 2024-06-10 NOTE — Progress Notes (Signed)
 Patient is not able to walk the distance required to go the bathroom, or he/she is unable to safely negotiate stairs required to access the bathroom.  A 3in1 BSC will alleviate this problem   Amenda Duclos P. Angie Fava M.D.

## 2024-06-10 NOTE — Plan of Care (Signed)
 Continue eduaction

## 2024-06-10 NOTE — Anesthesia Procedure Notes (Signed)
 Date/Time: 06/10/2024 7:34 AM  Performed by: Clementine Cutting, CRNAPre-anesthesia Checklist: Patient identified, Emergency Drugs available, Suction available, Patient being monitored and Timeout performed Patient Re-evaluated:Patient Re-evaluated prior to induction Oxygen Delivery Method: Simple face mask Induction Type: IV induction Placement Confirmation: CO2 detector and positive ETCO2

## 2024-06-11 ENCOUNTER — Encounter: Payer: Self-pay | Admitting: Orthopedic Surgery

## 2024-06-11 DIAGNOSIS — M1711 Unilateral primary osteoarthritis, right knee: Secondary | ICD-10-CM | POA: Diagnosis not present

## 2024-06-11 LAB — GLUCOSE, CAPILLARY: Glucose-Capillary: 98 mg/dL (ref 70–99)

## 2024-06-11 MED ORDER — ASPIRIN 81 MG PO CHEW: 81.0000 mg | CHEWABLE_TABLET | Freq: Two times a day (BID) | ORAL | Status: DC

## 2024-06-11 MED ORDER — METFORMIN HCL ER 500 MG PO TB24
ORAL_TABLET | ORAL | Status: AC
  Filled 2024-06-11: qty 1

## 2024-06-11 MED ORDER — OXYCODONE HCL 5 MG PO TABS: 5.0000 mg | ORAL_TABLET | ORAL | 0 refills | Status: DC | PRN

## 2024-06-11 MED ORDER — CELECOXIB 200 MG PO CAPS: 200.0000 mg | ORAL_CAPSULE | Freq: Two times a day (BID) | ORAL | 1 refills | Status: DC

## 2024-06-11 MED ORDER — TRAMADOL HCL 50 MG PO TABS: 50.0000 mg | ORAL_TABLET | ORAL | 0 refills | Status: DC | PRN

## 2024-06-11 NOTE — Anesthesia Postprocedure Evaluation (Signed)
 Anesthesia Post Note  Patient: Monica Hardin  Procedure(s) Performed: ARTHROPLASTY, KNEE, TOTAL, USING IMAGELESS COMPUTER-ASSISTED NAVIGATION (Right: Knee)  Patient location during evaluation: Nursing Unit Anesthesia Type: Spinal Level of consciousness: awake Pain management: pain level controlled Respiratory status: spontaneous breathing Postop Assessment: no headache Anesthetic complications: no   No notable events documented.   Last Vitals:  Vitals:   06/10/24 2213 06/11/24 0409  BP: 123/69 102/64  Pulse: 60 65  Resp: 16 16  Temp:  36.5 C  SpO2: 95% 93%    Last Pain:  Vitals:   06/11/24 0409  TempSrc: Temporal  PainSc:                  [name]

## 2024-06-11 NOTE — Progress Notes (Signed)
 DISCHARGE NOTE:    Pt IV removed and pt dc instructions given. Pt educated on bone foam. Pt received a 3 in 1 and RW delivered to hospital room. Pt given medication scripts, bone foam, honeycomb dressing, CHG soap and polar care. Pt wheeled down to medical mall entrance by staff. Pt's husband provided transportation.

## 2024-06-11 NOTE — Plan of Care (Signed)
  Problem: Education: Goal: Knowledge of General Education information will improve Description: Including pain rating scale, medication(s)/side effects and non-pharmacologic comfort measures Outcome: Progressing   Problem: Pain Managment: Goal: General experience of comfort will improve and/or be controlled Outcome: Progressing   Problem: Coping: Goal: Ability to adjust to condition or change in health will improve Outcome: Progressing   Problem: Fluid Volume: Goal: Ability to maintain a balanced intake and output will improve Outcome: Progressing

## 2024-06-11 NOTE — Progress Notes (Addendum)
 Physical Therapy Treatment Patient Details Name: Monica Hardin MRN: [id number] DOB: 01-Mar-1966 Today's Date: 06/11/2024   History of Present Illness Pt is a 58 y.o. female s/p R TKA d/t degenerative arthrosis 06/10/24.  PMH includes CFIDS, fibromyalgia, OA B hands/feet, sciatica, vertigo, Vitamin B12 and D deficiency, R MOHS sx, SOB with exertion, asthma, neurogenic claudication d/t lumbar spinal stenosis.    PT Comments  Pt completed PT session as below.  Pt met goals and no further acute needs identified.  Education complete.    If plan is discharge home, recommend the following: A little help with walking and/or transfers;A little help with bathing/dressing/bathroom;Assistance with cooking/housework;Assist for transportation;Help with stairs or ramp for entrance   Can travel by private vehicle        Equipment Recommendations  Rolling walker (2 wheels);BSC/3in1    Recommendations for Other Services       Precautions / Restrictions Precautions Precautions: Knee;Fall Precaution Booklet Issued: Yes (comment) Recall of Precautions/Restrictions: Intact Precaution/Restrictions Comments: KI if unable to SLR Restrictions Weight Bearing Restrictions Per Provider Order: Yes RLE Weight Bearing Per Provider Order: Weight bearing as tolerated     Mobility  Bed Mobility Overal bed mobility: Independent               Patient Response: Cooperative  Transfers Overall transfer level: Independent Equipment used: Rolling walker (2 wheels) Transfers: Sit to/from Stand                  Ambulation/Gait Ambulation/Gait assistance: Supervision Gait Distance (Feet): 300 Feet Assistive device: Rolling walker (2 wheels) Gait Pattern/deviations: Step-to pattern, Decreased step length - right, Decreased step length - left, Decreased stance time - right Gait velocity: WFL     General Gait Details: steady with no buckling or LOB   Stairs Stairs: Yes Stairs assistance:  Supervision, Modified independent (Device/Increase time) Stair Management: Two rails, Step to pattern, Forwards Number of Stairs: 4     Wheelchair Mobility     Tilt Bed Tilt Bed Patient Response: Cooperative  Modified Rankin (Stroke Patients Only)       Balance Overall balance assessment: Modified Independent                                          Communication Communication Communication: No apparent difficulties  Cognition Arousal: Alert Behavior During Therapy: WFL for tasks assessed/performed   PT - Cognitive impairments: No apparent impairments                         Following commands: Intact      Cueing Cueing Techniques: Verbal cues  Exercises Total Joint Exercises Goniometric ROM: 0-95 limited by bed features    General Comments        Pertinent Vitals/Pain Pain Assessment Pain Assessment: Faces Faces Pain Scale: Hurts a little bit Pain Location: R knee Pain Descriptors / Indicators: Aching Pain Intervention(s): Limited activity within patient's tolerance, Monitored during session, Premedicated before session, Repositioned, Ice applied    Home Living                          Prior Function            PT Goals (current goals can now be found in the care plan section) Progress towards PT goals: Progressing toward goals    Frequency  BID      PT Plan      Co-evaluation              AM-PAC PT 6 Clicks Mobility   Outcome Measure  Help needed turning from your back to your side while in a flat bed without using bedrails?: None Help needed moving from lying on your back to sitting on the side of a flat bed without using bedrails?: None Help needed moving to and from a bed to a chair (including a wheelchair)?: None Help needed standing up from a chair using your arms (e.g., wheelchair or bedside chair)?: None Help needed to walk in hospital room?: None Help needed climbing 3-5 steps  with a railing? : A Little 6 Click Score: 23    End of Session Equipment Utilized During Treatment: Gait belt Activity Tolerance: Patient tolerated treatment well Patient left: in bed;with call bell/phone within reach;with family/visitor present;Other (comment) (R heel floating via towel roll; polar care in place) Nurse Communication: Mobility status;Precautions;Weight bearing status;Other (comment) (Pt's pain status) PT Visit Diagnosis: Other abnormalities of gait and mobility (R26.89);Muscle weakness (generalized) (M62.81);Pain Pain - Right/Left: Right Pain - part of body: Knee     Time: 0900-0915 PT Time Calculation (min) (ACUTE ONLY): 15 min  Charges:    $Gait Training: 8-22 mins PT General Charges $$ ACUTE PT VISIT: 1 Visit                  Charlanne Cong, PTA 06/11/24, 9:36 AM

## 2024-06-11 NOTE — TOC Transition Note (Signed)
 Transition of Care [facility]) - Discharge Note   Patient Details  Name: Monica Hardin MRN: [id number] Date of Birth: 1966-09-10  Transition of Care [facility]) CM/SW Contact:  Alexandra Ice, RN Phone Number: 06/11/2024, 9:47 AM   Clinical Narrative:     Patient to discharge today, home with home health services. Patient set up with [facility] health services. Agency information added to AVS. Patient needs RW and BSC, sent order to Jon with [facility] for process. DME to be delivered to bedside. Shaun with [facility] is able to accept patient for home health services, and notified him of patient's discharge today.    Barriers to Discharge: Barriers Resolved   Patient Goals and CMS Choice   CMS Medicare.gov Compare Post Acute Care list provided to:: Patient Choice offered to / list presented to : Patient  Expected Discharge Plan and Services           Expected Discharge Date: 06/11/24               DME Arranged: Bedside commode, Walker rolling DME Agency: [facility] Date DME Agency Contacted: 06/11/24 Time DME Agency Contacted: [phone] Representative spoke with at DME Agency: Sam Creighton HH Arranged: PT, OT HH Agency: [facility] ([facility] Date HH Agency Contacted: 06/11/24 Time HH Agency Contacted: [phone] Representative spoke with at [facility] Agency: Shaun  Prior Living Arrangements/Services                       Activities of Daily Living   ADL Screening (condition at time of admission) Independently performs ADLs?: Yes (appropriate for developmental age) Is the patient deaf or have difficulty hearing?: No Does the patient have difficulty seeing, even when wearing glasses/contacts?: No Does the patient have difficulty concentrating, remembering, or making decisions?: No  Permission Sought/Granted                  Emotional Assessment              Admission diagnosis:  Primary osteoarthritis of right knee [M17.11] History of total knee arthroplasty,  right [Z96.651] 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 spinal stenosis 12/07/2022   Varicose veins of both lower extremities 02/27/2019   Primary osteoarthritis of both hands 06/15/2018   Primary osteoarthritis of both knees 06/15/2018   Primary osteoarthritis of both feet 06/15/2018   Positive ANA (antinuclear antibody) 06/15/2018   Fibromyalgia 06/15/2018   Encounter for screening colonoscopy 11/07/2016   Surgical menopause on hormone replacement therapy 09/01/2016   Vitamin D  deficiency 04/20/2016   Low vitamin B12 level 04/20/2016   History of uterine leiomyoma 04/19/2016   Sciatica, right side 04/19/2016   Status post TAH-BSO 11/12/2015   History of ovarian cyst 11/12/2015   Dyslipidemia 07/04/2015   Adult hypothyroidism 07/04/2015   Excess weight 07/04/2015   Disturbance of skin sensation 07/04/2015   Lipoma of arm 12/03/2014   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   PCP:  Arleen Lacer, MD Pharmacy:   [facility] [phone] [place], [place] - [address] AT [facility] [address] [place] [address] [place] [address] Phone: [phone] Fax: [phone]     Social Determinants of Health (SDOH) Interventions    Readmission Risk Interventions     No data to display  Final next level of care: Home w Home Health Services Barriers to Discharge: Barriers Resolved   Patient Goals and CMS Choice   CMS Medicare.gov Compare Post Acute Care list provided to:: Patient Choice offered to / list presented to : Patient      Discharge Placement                  Name of family member notified: Dufm Gibbon Patient and family notified of of transfer: 06/11/24  Discharge Plan and Services Additional resources added to the After Visit Summary for                  DME Arranged:  Bedside commode, Walker rolling DME Agency: [facility] Date DME Agency Contacted: 06/11/24 Time DME Agency Contacted: [phone] Representative spoke with at DME Agency: Sam Creighton HH Arranged: PT, OT [facility] Agency: [facility] Date HH Agency Contacted: 06/11/24 Time HH Agency Contacted: [phone] Representative spoke with at [facility] Agency: Shaun  Social Drivers of Health (SDOH) Interventions SDOH Screenings   Food Insecurity: No Food Insecurity (06/10/2024)  Housing: Low Risk  (06/10/2024)  Transportation Needs: No Transportation Needs (06/10/2024)  Utilities: Not At Risk (06/10/2024)  Alcohol Screen: Low Risk  (12/12/2023)  Depression (PHQ2-9): Low Risk  (05/08/2024)  Financial Resource Strain: Low Risk  (02/13/2024)  Physical Activity: Insufficiently Active (02/13/2024)  Social Connections: Socially Integrated (02/13/2024)  Stress: No Stress Concern Present (02/13/2024)  Tobacco Use: Low Risk  (06/10/2024)  Health Literacy: Adequate Health Literacy (12/12/2023)     Readmission Risk Interventions     No data to display

## 2024-06-11 NOTE — Evaluation (Signed)
 Occupational Therapy Evaluation Patient Details Name: Monica Hardin MRN: [id number] DOB: 10-27-1966 Today's Date: 06/11/2024   History of Present Illness   Pt is a 58 y.o. female s/p R TKA d/t degenerative arthrosis 06/10/24.  PMH includes CFIDS, fibromyalgia, OA B hands/feet, sciatica, vertigo, Vitamin B12 and D deficiency, R MOHS sx, SOB with exertion, asthma, neurogenic claudication d/t lumbar spinal stenosis.     Clinical Impressions Pt seen for OT evaluation this date, POD#1 from above surgery. PTA pt is MOD I-I in ADL/IADL, amb with no AD. Pt currently requires minimal assist for LB dressing while in seated position due to pain and limited AROM of R knee. Pt and husband provided education re:  polar care mgt, falls prevention strategies, home/routines modifications, DME/AE for LB bathing and dressing tasks, and compression stocking mgt. Pt would benefit from skilled OT services including additional instruction in dressing techniques with or without assistive devices for dressing and bathing skills to support recall and carryover prior to discharge and ultimately to maximize safety, independence, and minimize falls risk and caregiver burden. Do not currently anticipate any OT needs following this hospitalization.     If plan is discharge home, recommend the following:   A little help with bathing/dressing/bathroom;A little help with walking and/or transfers;Assist for transportation;Help with stairs or ramp for entrance;Assistance with cooking/housework     Functional Status Assessment   Patient has had a recent decline in their functional status and demonstrates the ability to make significant improvements in function in a reasonable and predictable amount of time.     Equipment Recommendations   BSC/3in1     Recommendations for Other Services         Precautions/Restrictions   Precautions Precautions: Knee;Fall Recall of Precautions/Restrictions:  Intact Restrictions Weight Bearing Restrictions Per Provider Order: Yes RLE Weight Bearing Per Provider Order: Weight bearing as tolerated     Mobility Bed Mobility Overal bed mobility: Modified Independent Bed Mobility: Supine to Sit, Sit to Supine       Sit to supine: HOB elevated, Contact guard assist        Transfers Overall transfer level: Needs assistance Equipment used: Rolling walker (2 wheels) Transfers: Sit to/from Stand Sit to Stand: Modified independent (Device/Increase time), Supervision                  Balance Overall balance assessment: Needs assistance Sitting-balance support: No upper extremity supported, Feet supported Sitting balance-Leahy Scale: Good     Standing balance support: Bilateral upper extremity supported, During functional activity, Reliant on assistive device for balance Standing balance-Leahy Scale: Good                             ADL either performed or assessed with clinical judgement   ADL Overall ADL's : Needs assistance/impaired Eating/Feeding: Set up;Sitting   Grooming: Supervision/safety;Standing;Wash/dry hands           Upper Body Dressing : Set up;Sitting   Lower Body Dressing: Minimal assistance;Sitting/lateral leans Lower Body Dressing Details (indicate cue type and reason): socks, MAX A for teds Toilet Transfer: Supervision/safety;Rolling walker (2 wheels);Ambulation;BSC/3in1 Statistician Details (indicate cue type and reason): intermittent vcs for RW technqiue Toileting- Clothing Manipulation and Hygiene: Supervision/safety;Sitting/lateral lean       Functional mobility during ADLs: Supervision/safety;Rolling walker (2 wheels) (approx 24' with RW)       Vision Patient Visual Report: No change from baseline       Perception  Praxis         Pertinent Vitals/Pain Pain Assessment Pain Assessment: 0-10 Pain Score: 2  Pain Location: R knee Pain Descriptors / Indicators:  Aching Pain Intervention(s): Monitored during session, Repositioned, Limited activity within patient's tolerance     Extremity/Trunk Assessment Upper Extremity Assessment Upper Extremity Assessment: Overall WFL for tasks assessed   Lower Extremity Assessment Lower Extremity Assessment: Defer to PT evaluation       Communication Communication Communication: No apparent difficulties   Cognition Arousal: Alert Behavior During Therapy: WFL for tasks assessed/performed Cognition: No apparent impairments                               Following commands: Intact       Cueing  General Comments   Cueing Techniques: Verbal cues      Exercises     Shoulder Instructions      Home Living Family/patient expects to be discharged to:: Private residence Living Arrangements: Spouse/significant other Available Help at Discharge: Family;Available 24 hours/day Type of Home: House Home Access: Stairs to enter [employer] of Steps: 1 very short step plus another short step Entrance Stairs-Rails: None Home Layout: Two level;Full bath on main level;Able to live on main level with bedroom/bathroom     Bathroom Shower/Tub: Producer, television/film/video: Handicapped height     Home Equipment: Cane - single point          Prior Functioning/Environment Prior Level of Function : Independent/Modified Independent                    OT Problem List: Decreased activity tolerance;Decreased knowledge of use of DME or AE;Decreased safety awareness;Impaired balance (sitting and/or standing);Decreased knowledge of precautions   OT Treatment/Interventions: Self-care/ADL training;DME and/or AE instruction;Therapeutic activities;Balance training;Therapeutic exercise;Energy conservation;Patient/family education      OT Goals(Current goals can be found in the care plan section)   Acute Rehab OT Goals Patient Stated Goal: go home OT Goal Formulation: With  patient/family Time For Goal Achievement: 06/25/24 Potential to Achieve Goals: Good ADL Goals Pt Will Perform Grooming: with modified independence;sitting;standing Pt Will Perform Lower Body Dressing: with modified independence;sit to/from stand;sitting/lateral leans Pt Will Transfer to Toilet: with modified independence;ambulating Pt Will Perform Toileting - Clothing Manipulation and hygiene: with modified independence;sitting/lateral leans;sit to/from stand   OT Frequency:  Min 2X/week    Co-evaluation              AM-PAC OT 6 Clicks Daily Activity     Outcome Measure Help from another person eating meals?: None Help from another person taking care of personal grooming?: None Help from another person toileting, which includes using toliet, bedpan, or urinal?: None Help from another person bathing (including washing, rinsing, drying)?: A Little Help from another person to put on and taking off regular upper body clothing?: None Help from another person to put on and taking off regular lower body clothing?: A Little 6 Click Score: 22   End of Session Equipment Utilized During Treatment: Rolling walker (2 wheels) Nurse Communication: Mobility status  Activity Tolerance: Patient tolerated treatment well Patient left: in bed;with call bell/phone within reach;with family/visitor present  OT Visit Diagnosis: Other abnormalities of gait and mobility (R26.89)                Time: 9562-1308 OT Time Calculation (min): 16 min Charges:  OT General Charges $OT Visit: 1 Visit OT Evaluation $OT  Eval Low Complexity: 1 Low  Gerre Kraft, OTD OTR/L  06/11/24, 10:25 AM

## 2024-07-01 ENCOUNTER — Other Ambulatory Visit (HOSPITAL_COMMUNITY): Payer: Self-pay

## 2024-07-01 ENCOUNTER — Telehealth: Payer: Self-pay | Admitting: Pharmacy Technician

## 2024-07-01 NOTE — Telephone Encounter (Signed)
 Pharmacy Patient Advocate Encounter   Received notification from CoverMyMeds that prior authorization for Pregabalin  75MG  capsules is required/requested.   Insurance verification completed.   The patient is insured through [facility] .   Per test claim: PA required; PA submitted to above mentioned insurance via CoverMyMeds Key/confirmation #/EOC [id number] Status is pending

## 2024-07-01 NOTE — Telephone Encounter (Signed)
 Pharmacy Patient Advocate Encounter  Received notification from [employer] that Prior Authorization for Pregabalin  75MG  capsules has been APPROVED from 07/01/24 to 01/01/25. Ran test claim, Copay is $10.00. This test claim was processed through [facility]- copay amounts may vary at other pharmacies due to pharmacy/plan contracts, or as the patient moves through the different stages of their insurance plan.   PA #/Case ID/Reference #:  [id number]

## 2024-08-06 ENCOUNTER — Other Ambulatory Visit: Payer: Self-pay | Admitting: Family Medicine

## 2024-08-06 DIAGNOSIS — E559 Vitamin D deficiency, unspecified: Secondary | ICD-10-CM

## 2024-08-15 ENCOUNTER — Other Ambulatory Visit: Payer: Self-pay | Admitting: Family Medicine

## 2024-08-15 DIAGNOSIS — G4700 Insomnia, unspecified: Secondary | ICD-10-CM

## 2024-08-19 ENCOUNTER — Ambulatory Visit: Payer: Managed Care, Other (non HMO) | Admitting: Family Medicine

## 2024-08-21 ENCOUNTER — Encounter: Payer: Self-pay | Admitting: Family Medicine

## 2024-08-21 ENCOUNTER — Ambulatory Visit (INDEPENDENT_AMBULATORY_CARE_PROVIDER_SITE_OTHER): Admitting: Family Medicine

## 2024-08-21 VITALS — BP 118/76 | HR 83 | Resp 16 | Ht 66.0 in | Wt 203.7 lb

## 2024-08-21 DIAGNOSIS — D8989 Other specified disorders involving the immune mechanism, not elsewhere classified: Secondary | ICD-10-CM

## 2024-08-21 DIAGNOSIS — M797 Fibromyalgia: Secondary | ICD-10-CM | POA: Diagnosis not present

## 2024-08-21 DIAGNOSIS — Z96651 Presence of right artificial knee joint: Secondary | ICD-10-CM | POA: Diagnosis not present

## 2024-08-21 DIAGNOSIS — R7303 Prediabetes: Secondary | ICD-10-CM

## 2024-08-21 DIAGNOSIS — H6993 Unspecified Eustachian tube disorder, bilateral: Secondary | ICD-10-CM

## 2024-08-21 DIAGNOSIS — E063 Autoimmune thyroiditis: Secondary | ICD-10-CM

## 2024-08-21 DIAGNOSIS — G4700 Insomnia, unspecified: Secondary | ICD-10-CM

## 2024-08-21 DIAGNOSIS — G4734 Idiopathic sleep related nonobstructive alveolar hypoventilation: Secondary | ICD-10-CM

## 2024-08-21 DIAGNOSIS — G9332 Myalgic encephalomyelitis/chronic fatigue syndrome: Secondary | ICD-10-CM

## 2024-08-21 DIAGNOSIS — E66811 Obesity, class 1: Secondary | ICD-10-CM | POA: Diagnosis not present

## 2024-08-21 MED ORDER — TRAZODONE HCL 100 MG PO TABS: 100.0000 mg | ORAL_TABLET | Freq: Every day | ORAL | 1 refills | Status: DC

## 2024-08-21 MED ORDER — METFORMIN HCL ER 500 MG PO TB24: 500.0000 mg | ORAL_TABLET | Freq: Every day | ORAL | 1 refills | Status: DC

## 2024-08-21 MED ORDER — WEGOVY 0.25 MG/0.5ML ~~LOC~~ SOAJ: 0.2500 mg | SUBCUTANEOUS | 0 refills | Status: DC

## 2024-08-21 MED ORDER — DULOXETINE HCL 60 MG PO CPEP: 60.0000 mg | ORAL_CAPSULE | Freq: Every day | ORAL | 1 refills | Status: DC

## 2024-08-21 MED ORDER — AZELASTINE HCL 0.1 % NA SOLN: 1.0000 | Freq: Two times a day (BID) | NASAL | 2 refills | Status: AC

## 2024-08-21 NOTE — Progress Notes (Signed)
 Name: Monica Hardin   MRN: [id number]    DOB: Jul 16, 1966   Date:08/21/2024       Progress Note  Subjective  Chief Complaint  Chief Complaint  Patient presents with   Medical Management of Chronic Issues   Discussed the use of AI scribe software for clinical note transcription with the patient, who gave verbal consent to proceed.  History of Present Illness Monica Hardin is a 58 year old female with fibromyalgia and osteoarthritis who presents for a six-month follow-up.  She underwent right knee arthroscopic surgery in June for primary osteoarthritis and reports significant improvement, although some swelling persists. She completed physical therapy and experiences no pain in the left knee. She is gradually resuming physical activity, including walking, and plans to join a water aerobics class.  Her fibromyalgia is managed with duloxetine , pregabalin  which she finds effective. She notes that fall is typically challenging due to temperature fluctuations. She is gradually increasing her physical activity post-surgery.  She has a history of eustachian tube dysfunction, managed with azelastine  and Flonase , which she finds helpful in controlling symptoms initially prescribed for left ear pain.  She has TMJ, for which she uses Tylenol  and avoids excessive jaw movement. She occasionally feels her jaw shift but does not hear it.  She is on low-dose oxygen therapy because her oxygen levels were low, and she reports not having sleep apnea. She uses trazodone  100 mg at night for sleep and reports sleeping well, although the oxygen machine is loud.  Her Hashimoto's thyroiditis is managed with cytomel  5 mcg and levothyroxine  75 mcg.   Obesity, she gained a lot of weight over the past couple of years. She follows a gluten-free and low-dairy diet and is concerned about her weight, attributing it to metabolic issues. Her A1c improved from 6.2 to 5.7 with metformin , though she feels it has not aided  weight loss. She is frustrated about the weight gain and worries about her overall health and willing to take medications for weight loss She has MyfitnessPal and is wiling to track her calorie intake. She eats a balanced breakfast and lunch but skips dinner due to fatigue. She only drinks water, no sweet beverages   She does not smoke or drink alcohol. She is postmenopausal and experiencing weight gain, which she finds frustrating despite efforts to eat healthily and exercise.    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 spinal stenosis 12/07/2022   Varicose veins of both lower extremities 02/27/2019   Primary osteoarthritis of both hands 06/15/2018   Primary osteoarthritis of both knees 06/15/2018   Primary osteoarthritis of both feet 06/15/2018   Positive ANA (antinuclear antibody) 06/15/2018   Fibromyalgia 06/15/2018   Encounter for screening colonoscopy 11/07/2016   Surgical menopause on hormone replacement therapy 09/01/2016   Vitamin D  deficiency 04/20/2016   Low vitamin B12 level 04/20/2016   History of uterine leiomyoma 04/19/2016   Sciatica, right side 04/19/2016   Status post TAH-BSO 11/12/2015   History of ovarian cyst 11/12/2015   Dyslipidemia 07/04/2015   Adult hypothyroidism 07/04/2015   Excess weight 07/04/2015   Disturbance of skin sensation 07/04/2015   Lipoma of arm 12/03/2014   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    Past Surgical History:  Procedure Laterality Date   ABDOMINAL HYSTERECTOMY N/A 10/05/2015   Procedure: HYSTERECTOMY ABDOMINAL;  Surgeon: Gladis  A Defrancesco, MD;  Location: [facility];  Service: Gynecology;  Laterality: N/A;   CHOLECYSTECTOMY N/A 03/27/2017   Procedure: LAPAROSCOPIC CHOLECYSTECTOMY;  Surgeon: Aloysius Plant, MD;  Location: [facility];  Service: General;  Laterality: N/A;    COLONOSCOPY WITH PROPOFOL  N/A 11/22/2016   Procedure: COLONOSCOPY WITH PROPOFOL ;  Surgeon: Reyes LELON Cota, MD;  Location: [facility];  Service: Endoscopy;  Laterality: N/A;   FRACTURE SURGERY     KNEE ARTHROPLASTY Right 06/10/2024   Procedure: ARTHROPLASTY, KNEE, TOTAL, USING IMAGELESS COMPUTER-ASSISTED NAVIGATION;  Surgeon: Mardee Lynwood SQUIBB, MD;  Location: [facility];  Service: Orthopedics;  Laterality: Right;   LAPAROSCOPY N/A 08/24/2015   Procedure: LAPAROSCOPY DIAGNOSTIC;  Surgeon: Gladis DELENA Dollar, MD;  Location: [facility];  Service: Gynecology;  Laterality: N/A;   LEEP  [id number]   [facility]   MANDIBLE FRACTURE SURGERY  1996   SALPINGOOPHORECTOMY Bilateral 10/05/2015   Procedure: SALPINGO OOPHORECTOMY;  Surgeon: Gladis DELENA Dollar, MD;  Location: [facility];  Service: Gynecology;  Laterality: Bilateral;    Family History  Problem Relation Age of Onset   Lung cancer Mother    Cancer Mother    Lung cancer Father    Cancer Father    Kidney Stones Daughter    Asthma Son    Breast cancer Paternal Aunt 19   Diabetes Paternal Grandmother    Thyroid  disease Sister    Heart attack Maternal Grandfather     Social History   Tobacco Use   Smoking status: Never   Smokeless tobacco: Never  Substance Use Topics   Alcohol use: Never     Current Outpatient Medications:    azelastine  (ASTELIN ) 0.1 % nasal spray, Place 1 spray into both nostrils 2 (two) times daily. Use in each nostril as directed, Disp: 30 mL, Rfl: 2   budesonide -formoterol  (SYMBICORT ) 160-4.5 MCG/ACT inhaler, Inhale 2 puffs into the lungs 2 (two) times daily., Disp: 1 each, Rfl: 12   cetirizine (ZYRTEC) 10 MG tablet, Take 20 mg by mouth daily., Disp: , Rfl:    DULoxetine  (CYMBALTA ) 60 MG capsule, Take 1 capsule (60 mg total) by mouth daily., Disp: 90 capsule, Rfl: 1   fluticasone  (FLONASE ) 50 MCG/ACT nasal spray, Place 2 sprays into both nostrils daily., Disp: 1 g, Rfl: 8   levothyroxine  (SYNTHROID ) 75 MCG tablet,  Take 75 mcg by mouth daily before breakfast., Disp: , Rfl:    liothyronine  (CYTOMEL ) 5 MCG tablet, Take 5 mcg by mouth daily., Disp: , Rfl:    Magnesium  Glycinate 100 MG CAPS, Take 1 capsule by mouth daily at 12 noon., Disp: 100 capsule, Rfl: 1   metFORMIN  (GLUCOPHAGE -XR) 500 MG 24 hr tablet, Take 1 tablet (500 mg total) by mouth daily with breakfast., Disp: 90 tablet, Rfl: 1   pregabalin  (LYRICA ) 75 MG capsule, Take 1-2 capsules (75-150 mg total) by mouth 3 (three) times daily. 1 in am, 1 in pm and two at bedtime, Disp: 360 capsule, Rfl: 1   Selenium 200 MCG CAPS, Take 200 mcg by mouth daily., Disp: , Rfl:    traZODone  (DESYREL ) 100 MG tablet, TAKE 1 TABLET(100 MG) BY MOUTH AT BEDTIME, Disp: 90 tablet, Rfl: 0   Vitamin D , Ergocalciferol , (DRISDOL ) 1.25 MG (50000 UNIT) CAPS capsule, TAKE 1 CAPSULE BY MOUTH 1 TIME A WEEK, Disp: 12 capsule, Rfl: 0   aspirin  81 MG chewable tablet, Chew 1 tablet (81 mg total) by mouth 2 (two) times daily. (Patient not taking: Reported on 08/21/2024), Disp: , Rfl:    celecoxib  (CELEBREX )  200 MG capsule, Take 1 capsule (200 mg total) by mouth 2 (two) times daily. (Patient not taking: Reported on 08/21/2024), Disp: 60 capsule, Rfl: 1   chlorhexidine  (HIBICLENS ) 4 % external liquid, Apply 15 mLs (1 Application total) topically as directed for 30 doses. Use as directed daily for 5 days every other week for 6 weeks. (Patient not taking: Reported on 08/21/2024), Disp: 946 mL, Rfl: 1   oxyCODONE  (OXY IR/ROXICODONE ) 5 MG immediate release tablet, Take 1 tablet (5 mg total) by mouth every 4 (four) hours as needed for moderate pain (pain score 4-6) (pain score 4-6). (Patient not taking: Reported on 08/21/2024), Disp: 30 tablet, Rfl: 0   traMADol  (ULTRAM ) 50 MG tablet, Take 1-2 tablets (50-100 mg total) by mouth every 4 (four) hours as needed for moderate pain (pain score 4-6). (Patient not taking: Reported on 08/21/2024), Disp: 30 tablet, Rfl: 0  Allergies  Allergen Reactions   Statins      myalgia    I personally reviewed active problem list, medication list, allergies, family history with the patient/caregiver today.   ROS  Ten systems reviewed and is negative except as mentioned in HPI    Objective Physical Exam  CONSTITUTIONAL: Patient appears well-developed and well-nourished.  No distress. HEENT: Head atraumatic, normocephalic, neck supple. CARDIOVASCULAR: Normal rate, regular rhythm and normal heart sounds.  No murmur heard. No BLE edema. PULMONARY: Effort normal and breath sounds normal. No respiratory distress. ABDOMINAL: There is no tenderness or distention. MUSCULOSKELETAL: good rom of right knee, trigger point positive  PSYCHIATRIC: Patient has a normal mood and affect. behavior is normal. Judgment and thought content normal.  Vitals:   08/21/24 0906  BP: 118/76  Pulse: 83  Resp: 16  SpO2: 97%  Weight: 203 lb 11.2 oz (92.4 kg)  Height: 5' 6 (1.676 m)    Body mass index is 32.88 kg/m.  Recent Results (from the past 2160 hours)  Surgical pcr screen     Status: Abnormal   Collection Time: 05/31/24  9:40 AM   Specimen: Nasal Mucosa; Nasal Swab  Result Value Ref Range   MRSA, PCR POSITIVE (A) NEGATIVE    Comment: RESULT CALLED TO, READ BACK BY AND VERIFIED WITH: DORISE PEREYRA NP @1459  05/31/24 ASW    Staphylococcus aureus POSITIVE (A) NEGATIVE    Comment: (NOTE) The Xpert SA Assay (FDA approved for NASAL specimens in patients 110 years of age and older), is one component of a comprehensive surveillance program. It is not intended to diagnose infection nor to guide or monitor treatment. Performed at [facility], [address] [place], [place] [address]   C-reactive protein     Status: Abnormal   Collection Time: 05/31/24  9:40 AM  Result Value Ref Range   CRP 1.4 (H) <1.0 mg/dL    Comment: Performed at [facility], [address] [place], [place] [address]  Sedimentation rate     Status: None   Collection Time: 05/31/24   9:40 AM  Result Value Ref Range   Sed Rate 18 0 - 30 mm/hr    Comment: Performed at [facility], [address] [place], [place] [address]  Hemoglobin A1c     Status: Abnormal   Collection Time: 05/31/24  9:40 AM  Result Value Ref Range   Hgb A1c MFr Bld 5.7 (H) 4.8 - 5.6 %    Comment: (NOTE) Diagnosis of Diabetes The following HbA1c ranges recommended by the American Diabetes Association (ADA) may be used as  an aid in the diagnosis of diabetes mellitus.  Hemoglobin             Suggested A1C NGSP%              Diagnosis  <5.7                   Non Diabetic  5.7-6.4                Pre-Diabetic  >6.4                   Diabetic  <7.0                   Glycemic control for                       adults with diabetes.     Mean Plasma Glucose 116.89 mg/dL    Comment: Performed at [facility], [address] [place], [place] [address]  CBC     Status: None   Collection Time: 05/31/24  9:40 AM  Result Value Ref Range   WBC 5.7 4.0 - 10.5 K/uL   RBC 4.33 3.87 - 5.11 MIL/uL   Hemoglobin 13.2 12.0 - 15.0 g/dL   HCT 59.7 63.9 - 53.9 %   MCV 92.8 80.0 - 100.0 fL   MCH 30.5 26.0 - 34.0 pg   MCHC 32.8 30.0 - 36.0 g/dL   RDW 87.5 88.4 - 84.4 %   Platelets 295 150 - 400 K/uL   nRBC 0.0 0.0 - 0.2 %    Comment: Performed at [facility], [address], [place], [place] [address]  Comprehensive metabolic panel     Status: Abnormal   Collection Time: 05/31/24  9:40 AM  Result Value Ref Range   Sodium 139 135 - 145 mmol/L   Potassium 3.6 3.5 - 5.1 mmol/L   Chloride 101 98 - 111 mmol/L   CO2 29 22 - 32 mmol/L   Glucose, Bld 122 (H) 70 - 99 mg/dL    Comment: Glucose reference range applies only to samples taken after fasting for at least 8 hours.   BUN 19 6 - 20 mg/dL   Creatinine, Ser 9.19 0.44 - 1.00 mg/dL   Calcium  9.2 8.9 - 10.3 mg/dL   Total Protein 7.3 6.5 - 8.1 g/dL   Albumin 4.1 3.5 - 5.0 g/dL   AST 22 15 - 41 U/L   ALT 25 0 - 44 U/L    Alkaline Phosphatase 66 38 - 126 U/L   Total Bilirubin 0.5 0.0 - 1.2 mg/dL   GFR, Estimated >39 >39 mL/min    Comment: (NOTE) Calculated using the CKD-EPI Creatinine Equation (2021)    Anion gap 9 5 - 15    Comment: Performed at [facility], [address] [place], [place] [address]  Urinalysis, Routine w reflex microscopic -Urine, Clean Catch     Status: Abnormal   Collection Time: 05/31/24  9:40 AM  Result Value Ref Range   Color, Urine YELLOW (A) YELLOW   APPearance HAZY (A) CLEAR   Specific Gravity, Urine 1.027 1.005 - 1.030   pH 5.0 5.0 - 8.0   Glucose, UA NEGATIVE NEGATIVE mg/dL   Hgb urine dipstick NEGATIVE NEGATIVE   Bilirubin Urine NEGATIVE NEGATIVE   Ketones, ur NEGATIVE NEGATIVE mg/dL   Protein, ur NEGATIVE NEGATIVE mg/dL   Nitrite NEGATIVE NEGATIVE   Leukocytes,Ua NEGATIVE NEGATIVE  Comment: Performed at [facility], [address] [place], [place] [address]  Glucose, capillary     Status: Abnormal   Collection Time: 06/10/24 10:54 AM  Result Value Ref Range   Glucose-Capillary 143 (H) 70 - 99 mg/dL    Comment: Glucose reference range applies only to samples taken after fasting for at least 8 hours.  Glucose, capillary     Status: Abnormal   Collection Time: 06/10/24  4:40 PM  Result Value Ref Range   Glucose-Capillary 261 (H) 70 - 99 mg/dL    Comment: Glucose reference range applies only to samples taken after fasting for at least 8 hours.   Comment 1 Notify RN    Comment 2 Document in Chart   Glucose, capillary     Status: Abnormal   Collection Time: 06/10/24 10:12 PM  Result Value Ref Range   Glucose-Capillary 120 (H) 70 - 99 mg/dL    Comment: Glucose reference range applies only to samples taken after fasting for at least 8 hours.   Comment 1 Notify RN    Comment 2 Document in Chart   Glucose, capillary     Status: None   Collection Time: 06/11/24  8:45 AM  Result Value Ref Range   Glucose-Capillary 98 70 - 99 mg/dL    Comment:  Glucose reference range applies only to samples taken after fasting for at least 8 hours.    Diabetic Foot Exam:     PHQ2/9:    08/21/2024    9:00 AM 05/08/2024   11:11 AM 02/14/2024    7:51 AM 12/12/2023    8:15 AM 11/13/2023    9:30 AM  Depression screen PHQ 2/9  Decreased Interest 0 0 0 1 1  Down, Depressed, Hopeless 0 0 0 1 1  PHQ - 2 Score 0 0 0 2 2  Altered sleeping   0 1 1  Tired, decreased energy   0 1 3  Change in appetite   0 0 0  Feeling bad or failure about yourself    0 0 1  Trouble concentrating   0 1 0  Moving slowly or fidgety/restless   0 0 0  Suicidal thoughts   0 0 0  PHQ-9 Score   0 5 7  Difficult doing work/chores   Not difficult at all Somewhat difficult Somewhat difficult    phq 9 is negative  Fall Risk:    08/21/2024    9:00 AM 05/08/2024   11:11 AM 02/14/2024    7:46 AM 11/13/2023    9:30 AM 06/09/2023    9:34 AM  Fall Risk   Falls in the past year? 0 0 0 0 0  Number falls in past yr: 0 0 0  0  Injury with Fall? 0 0 0  0  Risk for fall due to : No Fall Risks No Fall Risks No Fall Risks No Fall Risks No Fall Risks  Follow up Falls evaluation completed Falls prevention discussed;Education provided;Falls evaluation completed Falls prevention discussed;Education provided;Falls evaluation completed Falls prevention discussed Falls prevention discussed      Assessment & Plan Fibromyalgia Fibromyalgia is well-managed with current medications. She finds fall challenging due to temperature fluctuations but remains active. - Continue duloxetine , pregabalin , and tramadol  as prescribed. - Encourage gradual increase in physical activity, including walking and water aerobics.  Primary osteoarthritis of right knee, status post arthroscopy Post-arthroscopy, she reports significant improvement with some expected swelling.  Obesity Weight management is challenging despite a healthy lifestyle.  Postmenopausal status may contribute to weight gain. Considering  Wegovy  pending insurance approval. - Submit prescription for Wegovy  to pharmacy for insurance approval. - Encourage continued healthy eating and physical activity. - Advise using My Fitness Pal to log calorie intake and ensure adequate protein consumption.  Metabolic syndrome Metformin  improved A1c from 6.2 to 5.7, indicating better glucose control. - Continue metformin  for glucose control. - Monitor blood sugar levels.  Eustachian tube dysfunction, bilateral Symptoms are effectively controlled with azelastine  and Flonase . - Continue azelastine  and Flonase  as needed.  Temporomandibular joint disorder TMJ disorder managed with Tylenol  and avoiding excessive jaw movements. Consideration of mouth guard advised. - Continue Tylenol  for pain management. - Avoid excessive jaw movements. - Consider using a mouth guard to prevent teeth grinding.  Chronic fatigue syndrome Presents primarily as fatigue. Advised to maintain a balanced diet and incorporate physical activity. - Encourage balanced diet and physical activity as tolerated.  Hypothyroidism due to Hashimoto's thyroiditis Condition managed by a specialist with cytomel  and levothyroxine . - Continue cytomel  and levothyroxine  as prescribed.  Hyperlipidemia LDL improved from 170 to 109 through dietary management. - Continue dietary management for cholesterol control.  Prediabetes Managed with metformin , improving A1c. - Continue metformin . - Monitor blood sugar levels.  Chronic hypoxemia Monitored by pulmonary doctor. On low-dose oxygen, possibly weaning off pending further testing. - Continue monitoring oxygen levels with pulmonary doctor. - Await further testing to determine need for continued oxygen therapy.

## 2024-08-27 ENCOUNTER — Other Ambulatory Visit (HOSPITAL_COMMUNITY): Payer: Self-pay

## 2024-08-27 ENCOUNTER — Telehealth: Payer: Self-pay | Admitting: Pharmacy Technician

## 2024-08-27 NOTE — Telephone Encounter (Signed)
 PA request has been Submitted. New Encounter has been or will be created for follow up. For additional info see Pharmacy Prior Auth telephone encounter from 08-27-2024.

## 2024-08-27 NOTE — Telephone Encounter (Signed)
 Pharmacy Patient Advocate Encounter  Received notification from [employer] that Prior Authorization for Wegovy  0.25MG /0.5ML auto-injectors  has been APPROVED from 08/27/24 to 08/27/25. Ran test claim, Copay is $0.00. This test claim was processed through [facility]- copay amounts may vary at other pharmacies due to pharmacy/plan contracts, or as the patient moves through the different stages of their insurance plan.   PA #/Case ID/Reference #: [id number]

## 2024-08-27 NOTE — Telephone Encounter (Signed)
 Pharmacy Patient Advocate Encounter   Received notification from Patient Advice Request messages that prior authorization for Wegovy  0.25MG /0.5ML auto-injectors  is required/requested.   Insurance verification completed.   The patient is insured through [facility] .   Per test claim: PA required; PA submitted to above mentioned insurance via Latent Key/confirmation #/EOC [id number] Status is pending

## 2024-09-09 ENCOUNTER — Other Ambulatory Visit: Payer: Self-pay | Admitting: Family Medicine

## 2024-09-09 DIAGNOSIS — Z1231 Encounter for screening mammogram for malignant neoplasm of breast: Secondary | ICD-10-CM

## 2024-09-24 ENCOUNTER — Encounter: Payer: Self-pay | Admitting: Family Medicine

## 2024-09-25 ENCOUNTER — Other Ambulatory Visit: Payer: Self-pay | Admitting: Medical Genetics

## 2024-09-27 ENCOUNTER — Other Ambulatory Visit
Admission: RE | Admit: 2024-09-27 | Discharge: 2024-09-27 | Disposition: A | Discharge status: Home / Self Care | Payer: Self-pay | Source: Ambulatory Visit | Attending: Medical Genetics | Admitting: Medical Genetics

## 2024-10-01 ENCOUNTER — Ambulatory Visit: Admitting: Family Medicine

## 2024-10-01 ENCOUNTER — Encounter: Payer: Self-pay | Admitting: Family Medicine

## 2024-10-01 VITALS — BP 128/78 | HR 85 | Resp 16 | Ht 66.0 in | Wt 201.0 lb

## 2024-10-01 DIAGNOSIS — E66811 Obesity, class 1: Secondary | ICD-10-CM

## 2024-10-01 DIAGNOSIS — Z6832 Body mass index (BMI) 32.0-32.9, adult: Secondary | ICD-10-CM | POA: Diagnosis not present

## 2024-10-01 DIAGNOSIS — R7303 Prediabetes: Secondary | ICD-10-CM | POA: Diagnosis not present

## 2024-10-01 DIAGNOSIS — M797 Fibromyalgia: Secondary | ICD-10-CM | POA: Diagnosis not present

## 2024-10-01 DIAGNOSIS — Z23 Encounter for immunization: Secondary | ICD-10-CM | POA: Diagnosis not present

## 2024-10-01 MED ORDER — WEGOVY 0.5 MG/0.5ML ~~LOC~~ SOAJ: 0.5000 mg | SUBCUTANEOUS | 0 refills | Status: DC

## 2024-10-01 NOTE — Progress Notes (Signed)
 Name: Monica Hardin   MRN: [id number]    DOB: [birth year]   Date:10/01/2024       Progress Note  Subjective  Chief Complaint  Chief Complaint  Patient presents with   Medical Management of Chronic Issues   Discussed the use of AI scribe software for clinical note transcription with the patient, who gave verbal consent to proceed.  History of Present Illness Monica Hardin is a 58 year old female with obesity who presents for a follow-up on weight loss medication.  She is following up on her weight loss medication, Wegovy , which she started after getting approval. She has completed the initial dose of 0.25 mg and has lost three pounds. She reports no significant side effects from the medication. Her goal is to lose at least 5% of her original weight, approximately 11 to 12 pounds.  She has a history of obesity with associated comorbidities including fibromyalgia, primary osteoarthritis, hypoxemia, and prediabetes with an A1c as high as 6.2. She also has Hashimoto's thyroiditis. She is currently on metformin  for her prediabetes and plans to continue it alongside Wegovy .  She has started water aerobics twice a week to aid in her weight loss efforts and is learning exercises she can do independently in the pool. She has adjusted her diet to include more protein, especially at dinner, and is considering adding protein shakes for convenience during busy days. She follows a gluten-free diet and believes she already eats healthily, with no further dietary restrictions needed at this time.  No low blood sugar, vomiting, or abdominal pain.    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 spinal stenosis 12/07/2022   Varicose veins of both lower extremities 02/27/2019   Primary osteoarthritis of both hands 06/15/2018   Primary osteoarthritis of both knees 06/15/2018    Primary osteoarthritis of both feet 06/15/2018   Positive ANA (antinuclear antibody) 06/15/2018   Fibromyalgia 06/15/2018   Encounter for screening colonoscopy 11/07/2016   Surgical menopause on hormone replacement therapy 09/01/2016   Vitamin D  deficiency 04/20/2016   Low vitamin B12 level 04/20/2016   History of uterine leiomyoma 04/19/2016   Sciatica, right side 04/19/2016   Status post TAH-BSO 11/12/2015   History of ovarian cyst 11/12/2015   Dyslipidemia 07/04/2015   Adult hypothyroidism 07/04/2015   Excess weight 07/04/2015   Disturbance of skin sensation 07/04/2015   Lipoma of arm 12/03/2014   CFIDS (chronic fatigue and immune dysfunction syndrome) 05/08/2007    Past Surgical History:  Procedure Laterality Date   ABDOMINAL HYSTERECTOMY N/A 10/05/2015   Procedure: HYSTERECTOMY ABDOMINAL;  Surgeon: Gladis DELENA Dollar, MD;  Location: [facility];  Service: Gynecology;  Laterality: N/A;   CHOLECYSTECTOMY N/A 03/27/2017   Procedure: LAPAROSCOPIC CHOLECYSTECTOMY;  Surgeon: Aloysius Plant, MD;  Location: [facility];  Service: General;  Laterality: N/A;   COLONOSCOPY WITH PROPOFOL  N/A 11/22/2016   Procedure: COLONOSCOPY WITH PROPOFOL ;  Surgeon: Reyes LELON Cota, MD;  Location: [facility];  Service: Endoscopy;  Laterality: N/A;   FRACTURE SURGERY     KNEE ARTHROPLASTY Right 06/10/2024   Procedure: ARTHROPLASTY, KNEE, TOTAL, USING IMAGELESS COMPUTER-ASSISTED NAVIGATION;  Surgeon: Mardee Lynwood SQUIBB, MD;  Location: [facility];  Service: Orthopedics;  Laterality: Right;   LAPAROSCOPY N/A 08/24/2015   Procedure: LAPAROSCOPY DIAGNOSTIC;  Surgeon: Gladis DELENA Dollar, MD;  Location: [facility];  Service: Gynecology;  Laterality: N/A;   LEEP  [id number]   [facility]   MANDIBLE FRACTURE SURGERY  1996   SALPINGOOPHORECTOMY Bilateral 10/05/2015   Procedure: SALPINGO OOPHORECTOMY;  Surgeon: Gladis DELENA Dollar, MD;  Location: [facility];  Service: Gynecology;  Laterality: Bilateral;    Family History   Problem Relation Age of Onset   Lung cancer Mother    Cancer Mother    Lung cancer Father    Cancer Father    Kidney Stones Daughter    Asthma Son    Breast cancer Paternal Aunt 52   Diabetes Paternal Grandmother    Thyroid  disease Sister    Heart attack Maternal Grandfather     Social History   Tobacco Use   Smoking status: Never   Smokeless tobacco: Never  Substance Use Topics   Alcohol use: Never     Current Outpatient Medications:    azelastine  (ASTELIN ) 0.1 % nasal spray, Place 1 spray into both nostrils 2 (two) times daily. Use in each nostril as directed, Disp: 30 mL, Rfl: 2   budesonide -formoterol  (SYMBICORT ) 160-4.5 MCG/ACT inhaler, Inhale 2 puffs into the lungs 2 (two) times daily., Disp: 1 each, Rfl: 12   cetirizine (ZYRTEC) 10 MG tablet, Take 20 mg by mouth daily., Disp: , Rfl:    DULoxetine  (CYMBALTA ) 60 MG capsule, Take 1 capsule (60 mg total) by mouth daily., Disp: 90 capsule, Rfl: 1   fluticasone  (FLONASE ) 50 MCG/ACT nasal spray, Place 2 sprays into both nostrils daily., Disp: 1 g, Rfl: 8   levothyroxine  (SYNTHROID ) 75 MCG tablet, Take 75 mcg by mouth daily before breakfast., Disp: , Rfl:    liothyronine  (CYTOMEL ) 5 MCG tablet, Take 5 mcg by mouth daily., Disp: , Rfl:    Magnesium  Glycinate 100 MG CAPS, Take 1 capsule by mouth daily at 12 noon., Disp: 100 capsule, Rfl: 1   metFORMIN  (GLUCOPHAGE -XR) 500 MG 24 hr tablet, Take 1 tablet (500 mg total) by mouth daily with breakfast., Disp: 90 tablet, Rfl: 1   pregabalin  (LYRICA ) 75 MG capsule, Take 1-2 capsules (75-150 mg total) by mouth 3 (three) times daily. 1 in am, 1 in pm and two at bedtime, Disp: 360 capsule, Rfl: 1   Selenium 200 MCG CAPS, Take 200 mcg by mouth daily., Disp: , Rfl:    semaglutide -weight management (WEGOVY ) 0.25 MG/0.5ML SOAJ SQ injection, Inject 0.25 mg into the skin once a week., Disp: 2 mL, Rfl: 0   traZODone  (DESYREL ) 100 MG tablet, Take 1 tablet (100 mg total) by mouth at bedtime., Disp: 90  tablet, Rfl: 1   Vitamin D , Ergocalciferol , (DRISDOL ) 1.25 MG (50000 UNIT) CAPS capsule, TAKE 1 CAPSULE BY MOUTH 1 TIME A WEEK, Disp: 12 capsule, Rfl: 0  Allergies  Allergen Reactions   Statins     myalgia    I personally reviewed active problem list, medication list, allergies with the patient/caregiver today.   ROS  Ten systems reviewed and is negative except as mentioned in HPI    Objective Physical Exam CONSTITUTIONAL: Patient appears well-developed and well-nourished. No distress. HEENT: Head atraumatic, normocephalic, neck supple. CARDIOVASCULAR: Normal rate, regular rhythm and normal heart sounds. No murmur heard. No BLE edema. PULMONARY: Effort normal and breath sounds normal. Lungs clear to auscultation. No respiratory distress. ABDOMINAL: There is no tenderness or distention. MUSCULOSKELETAL: Normal gait. Without gross motor or sensory deficit. PSYCHIATRIC: Patient has a normal mood and affect. Behavior is normal. Judgment and thought content normal.  Vitals:   10/01/24 1447  BP: 128/78  Pulse: 85  Resp: 16  SpO2:  97%  Weight: 201 lb (91.2 kg)  Height: 5' 6 (1.676 m)    Body mass index is 32.44 kg/m.    PHQ2/9:    10/01/2024    2:42 PM 08/21/2024    9:00 AM 05/08/2024   11:11 AM 02/14/2024    7:51 AM 12/12/2023    8:15 AM  Depression screen PHQ 2/9  Decreased Interest 0 0 0 0 1  Down, Depressed, Hopeless 0 0 0 0 1  PHQ - 2 Score 0 0 0 0 2  Altered sleeping    0 1  Tired, decreased energy    0 1  Change in appetite    0 0  Feeling bad or failure about yourself     0 0  Trouble concentrating    0 1  Moving slowly or fidgety/restless    0 0  Suicidal thoughts    0 0  PHQ-9 Score    0 5  Difficult doing work/chores    Not difficult at all Somewhat difficult    phq 9 is negative  Fall Risk:    10/01/2024    2:41 PM 08/21/2024    9:00 AM 05/08/2024   11:11 AM 02/14/2024    7:46 AM 11/13/2023    9:30 AM  Fall Risk   Falls in the past year? 0 0 0 0  0  Number falls in past yr: 0 0 0 0   Injury with Fall? 0 0 0 0   Risk for fall due to : No Fall Risks No Fall Risks No Fall Risks No Fall Risks No Fall Risks  Follow up Falls evaluation completed Falls evaluation completed Falls prevention discussed;Education provided;Falls evaluation completed Falls prevention discussed;Education provided;Falls evaluation completed Falls prevention discussed      Assessment & Plan Obesity Obesity with a goal to lose at least 5% of original weight. Currently lost 3 pounds. No side effects from Wegovy . Wegovy  may improve comorbidities such as fibromyalgia, hypoxemia, primary osteoarthritis, and prediabetes. - Increase Wegovy  to 0.5 mg. - Continue water aerobics. - Add protein shakes to diet. - Continue documenting weight loss and lifestyle changes.  Fibromyalgia Fibromyalgia symptoms may improve with weight loss. Water aerobics is beneficial for fibromyalgia and knee issues. - Continue water aerobics, gradually increasing frequency.  Primary osteoarthritis Primary osteoarthritis symptoms may improve with weight loss. Water aerobics is beneficial for joint health. - Continue water aerobics.  Prediabetes Prediabetes with A1c previously as high as 6.2. Weight loss and Wegovy  may help improve glycemic control. Currently on metformin . - Continue metformin .  Hashimoto's thyroiditis Hashimoto's thyroiditis with potential benefit from Wegovy  in reducing inflammation. Specialist is supportive of current treatment plan.

## 2024-10-07 ENCOUNTER — Ambulatory Visit

## 2024-10-07 LAB — GENECONNECT MOLECULAR SCREEN: Genetic Analysis Overall Interpretation: NEGATIVE

## 2024-10-12 ENCOUNTER — Encounter: Payer: Self-pay | Admitting: Family Medicine

## 2024-10-14 ENCOUNTER — Other Ambulatory Visit: Payer: Self-pay | Admitting: Family Medicine

## 2024-10-14 DIAGNOSIS — Z803 Family history of malignant neoplasm of breast: Secondary | ICD-10-CM

## 2024-10-18 ENCOUNTER — Other Ambulatory Visit: Payer: Self-pay | Admitting: Family Medicine

## 2024-10-18 DIAGNOSIS — M48062 Spinal stenosis, lumbar region with neurogenic claudication: Secondary | ICD-10-CM

## 2024-10-18 DIAGNOSIS — M797 Fibromyalgia: Secondary | ICD-10-CM

## 2024-10-23 ENCOUNTER — Encounter: Payer: Self-pay | Admitting: Licensed Clinical Social Worker

## 2024-10-23 ENCOUNTER — Other Ambulatory Visit: Payer: Self-pay | Admitting: Licensed Clinical Social Worker

## 2024-10-23 ENCOUNTER — Inpatient Hospital Stay

## 2024-10-23 ENCOUNTER — Inpatient Hospital Stay: Attending: Oncology | Admitting: Licensed Clinical Social Worker

## 2024-10-23 DIAGNOSIS — Z1379 Encounter for other screening for genetic and chromosomal anomalies: Secondary | ICD-10-CM

## 2024-10-23 DIAGNOSIS — Z801 Family history of malignant neoplasm of trachea, bronchus and lung: Secondary | ICD-10-CM | POA: Diagnosis not present

## 2024-10-23 DIAGNOSIS — Z8481 Family history of carrier of genetic disease: Secondary | ICD-10-CM

## 2024-10-23 DIAGNOSIS — Z803 Family history of malignant neoplasm of breast: Secondary | ICD-10-CM

## 2024-10-23 LAB — GENETIC SCREENING ORDER

## 2024-10-23 NOTE — Progress Notes (Signed)
 REFERRING PROVIDER: Sowles, Krichna, MD [address] [place],  [place] [address]  PRIMARY PROVIDER:  Glenard Mire, MD  PRIMARY REASON FOR VISIT:  1. Family history of gene mutation   2. Family history of breast cancer   3. Family history of lung cancer      HISTORY OF PRESENT ILLNESS:   Monica Hardin, a 58 y.o. female, was seen for a [facility] cancer genetics consultation at the request of Dr. Glenard due to a family history of cancer and her sister's recent genetic testing that showed a DDX41 mutation.  Monica Hardin presents to clinic today to discuss the possibility of a hereditary predisposition to cancer, genetic testing, and to further clarify her future cancer risks, as well as potential cancer risks for family members.   CANCER HISTORY:  Monica Hardin is a 58 y.o. female with no personal history of cancer.    RELEVANT MEDICAL HISTORY:  Menarche was at age 52.  First live birth at age 47.  Ovaries intact: no.  Hysterectomy: yes. (At 49) Menopausal status: postmenopausal.  HRT use: 5 years, stopped 5 years ago.  Colonoscopy: yes; normal. Mammogram within the last year: yes. Number of breast biopsies: 1.  Monica Hardin had negative screening through the [facility] program. [facility] Population Screen is a screening test that analyzes 11 genes related to hereditary breast and ovarian cancer syndrome (HBOC), Lynch syndrome, and familial hypercholesterolemia. These include APOB, BRCA1, BRCA2, EPCAM, LDLR, LDLRAP1, PCSK9, PMS2 (excluding exons 11-15), MLH1, MSH2, MSH6. This test reports pathogenic and likely pathogenic variants, but does not report on variants of unknown significance. The absence of any additional pathogenic variants is reassuring, but does not rule out a hereditary condition. There are other variants and genes associated with heart disease, hereditary cancer and other inherited conditions that were not included in this test.      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   CFIDS (chronic fatigue and immune dysfunction syndrome) 05/08/2007   Chronic fatigue    Disturbance of skin sensation 07/04/2015   Dyspnea 02/27/2024   Fibromyalgia 06/15/2018   Hyperlipidemia    Hypothyroidism    Lipoma of arm 12/03/2014   Low vitamin B12 level 04/20/2016   Neurogenic claudication due to lumbar spinal stenosis 12/07/2022   Overweight    Positive ANA (antinuclear antibody) 06/15/2018   Primary osteoarthritis of both feet 06/15/2018   Primary osteoarthritis of both hands 06/15/2018   Primary osteoarthritis of both hands 06/15/2018   Primary osteoarthritis of both knees 06/15/2018   Sciatica, right side    Thyroid  disease ?   Varicose veins of both lower extremities 02/27/2019   Vitamin D  deficiency 04/20/2016    Past Surgical History:  Procedure Laterality Date   ABDOMINAL HYSTERECTOMY N/A 10/05/2015   Procedure: HYSTERECTOMY ABDOMINAL;  Surgeon: Gladis DELENA Dollar, MD;  Location: [facility];  Service: Gynecology;  Laterality: N/A;   CHOLECYSTECTOMY N/A 03/27/2017   Procedure: LAPAROSCOPIC CHOLECYSTECTOMY;  Surgeon: Aloysius Plant, MD;  Location: [facility];  Service: General;  Laterality: N/A;   COLONOSCOPY WITH PROPOFOL  N/A 11/22/2016   Procedure: COLONOSCOPY WITH PROPOFOL ;  Surgeon: Reyes LELON Cota, MD;  Location: [facility];  Service: Endoscopy;  Laterality: N/A;   FRACTURE SURGERY     KNEE ARTHROPLASTY Right 06/10/2024   Procedure: ARTHROPLASTY, KNEE, TOTAL, USING IMAGELESS COMPUTER-ASSISTED NAVIGATION;  Surgeon: Mardee Lynwood SQUIBB, MD;  Location: [facility];  Service:  Orthopedics;  Laterality: Right;   LAPAROSCOPY N/A 08/24/2015   Procedure: LAPAROSCOPY DIAGNOSTIC;  Surgeon: Gladis DELENA Dollar, MD;  Location: [facility];  Service: Gynecology;  Laterality: N/A;   LEEP  [id number]   [facility]   MANDIBLE FRACTURE SURGERY  1996   SALPINGOOPHORECTOMY Bilateral 10/05/2015   Procedure: SALPINGO  OOPHORECTOMY;  Surgeon: Gladis DELENA Dollar, MD;  Location: [facility];  Service: Gynecology;  Laterality: Bilateral;    FAMILY HISTORY:  We obtained a detailed, 4-generation family history.  Significant diagnoses are listed below: Family History  Problem Relation Age of Onset   Lung cancer Mother 2   Lung cancer Father 61   Thyroid  disease Sister    Breast cancer Sister 23   Other Sister        DDX41+   Breast cancer Paternal Aunt 78   Breast cancer Paternal Aunt    Lung cancer Paternal Uncle    Non-Hodgkin's lymphoma Paternal Uncle    Heart attack Maternal Grandfather    Diabetes Paternal Grandmother    Lung cancer Paternal Grandfather    Kidney Stones Daughter    Asthma Son    Monica Hardin has 1 son and 1 daughter. She has 1 sister who was recently diagnosed with breast cancer at 44 and incidentally found to have a DDX41 mutation.   Monica Hardin mother passed of lung cancer at 10. A maternal aunt had unknown cancer. A maternal cousin had unknown cancer.   Monica Hardin father passed of lung cancer at 91. Two paternal aunts had breast cancer, one passed of it in her 12s and the other had it in her 49s and is living in her 18s. A paternal uncle and grandfather had lung cancer. Paternal uncle died of Non-Hodgkin's lymphoma in his teens.   Monica Hardin is aware of previous family history of genetic testing for hereditary cancer risks. There is no reported Ashkenazi Jewish ancestry. There is no known consanguinity.    GENETIC COUNSELING ASSESSMENT: Ms. Minckler is a 58 y.o. female with a family history of breast cancer and a DDX41 mutation in her sister. We, therefore, discussed and recommended the following at today's visit.   DISCUSSION: We discussed that approximately 10% of breast cancer is hereditary. Most cases of hereditary breast cancer are associated with BRCA1/BRCA2 genes, which she tested negative for through [facility], although there are other genes associated with  hereditary cancer as well. Cancers and risks are gene specific. We discussed the DDX41 gene mutation found in her sister is likely germline but may also be somatic. This gene can increase risk for leukemia/blood cancers. If we find this mutation in Ms. Yarbro, it would confirm that her sister's DDX41 mutation is germline. We discussed that testing is beneficial for several reasons including knowing about cancer risks, identifying potential screening and risk-reduction options that may be appropriate, and to understand if other family members could be at risk for cancer and allow them to undergo genetic testing.   We reviewed the characteristics, features and inheritance patterns of hereditary cancer syndromes. We also discussed genetic testing, including the appropriate family members to test, the process of testing, insurance coverage and turn-around-time for results. We discussed the implications of a negative, positive and/or variant of uncertain significant result. We recommended Ms. Yau pursue genetic testing for the [facility] CancerNext-Expanded+RNA gene panel.   The CancerNext-Expanded gene panel offered by [facility] and includes sequencing, rearrangement, and RNA analysis for the following 77 genes: AIP, ALK, APC, ATM, AXIN2, BAP1, BARD1, BMPR1A, BRCA1,  BRCA2, BRIP1, CDC73, CDH1, CDK4, CDKN1B, CDKN2A, CEBPA, CHEK2, CTNNA1, DDX41, DICER1, ETV6, FH, FLCN, GATA2, LZTR1, MAX, MBD4, MEN1, MET, MLH1, MSH2, MSH3, MSH6, MUTYH, NF1, NF2, NTHL1, PALB2, PHOX2B, PMS2, POT1, PRKAR1A, PTCH1, PTEN, RAD51C, RAD51D, RB1, RET, RPS20, RUNX1, SDHA, SDHAF2, SDHB, SDHC, SDHD, SMAD4, SMARCA4, SMARCB1, SMARCE1, STK11, SUFU, TMEM127, TP53, TSC1, TSC2, VHL, and WT1 (sequencing and deletion/duplication); EGFR, HOXB13, KIT, MITF, PDGFRA, POLD1, and POLE (sequencing only); EPCAM and GREM1 (deletion/duplication only).   Based on Ms. Romer's family history of cancer and sister's genetic test result, she meets medical  criteria for genetic testing. Despite that she meets criteria, she may still have an out of pocket cost.  PLAN: After considering the risks, benefits, and limitations, Ms. Gutierres provided informed consent to pursue genetic testing and the blood sample was sent to [facility] for analysis of the CancerNext-Expanded+RNA Panel. Results should be available within approximately 2-3 weeks' time, at which point they will be disclosed by telephone to Ms. Jabs, as will any additional recommendations warranted by these results. Ms. Elsea will receive a summary of her genetic counseling visit and a copy of her results once available. This information will also be available in Epic.   Ms. Glomski questions were answered to her satisfaction today. Our contact information was provided should additional questions or concerns arise. Thank you for the referral and allowing us  to share in the care of your patient.   Dena Cary, MS, [facility] Genetic Counselor [email or web address] Phone: [phone]  40 minutes were spent on the date of the encounter in service to the patient including preparation, face-to-face consultation, documentation and care coordination. Dr. Delinda was available for discussion regarding this case.   _______________________________________________________________________ For Office Staff:  Number of people involved in session: 1 Was an Intern/ student involved with case: no

## 2024-10-29 ENCOUNTER — Other Ambulatory Visit: Payer: Self-pay | Admitting: Family Medicine

## 2024-10-29 NOTE — Telephone Encounter (Signed)
 Pt interested going up on dose.

## 2024-10-30 ENCOUNTER — Other Ambulatory Visit: Payer: Self-pay | Admitting: Family Medicine

## 2024-10-30 MED ORDER — WEGOVY 1 MG/0.5ML ~~LOC~~ SOAJ: 1.0000 mg | SUBCUTANEOUS | 0 refills | Status: DC

## 2024-11-01 ENCOUNTER — Ambulatory Visit: Admitting: Internal Medicine

## 2024-11-05 ENCOUNTER — Other Ambulatory Visit: Payer: Self-pay | Admitting: Family Medicine

## 2024-11-05 DIAGNOSIS — M797 Fibromyalgia: Secondary | ICD-10-CM

## 2024-11-07 ENCOUNTER — Encounter: Payer: Self-pay | Admitting: Internal Medicine

## 2024-11-07 ENCOUNTER — Other Ambulatory Visit: Payer: Self-pay | Admitting: Family Medicine

## 2024-11-07 ENCOUNTER — Ambulatory Visit (INDEPENDENT_AMBULATORY_CARE_PROVIDER_SITE_OTHER): Admitting: Internal Medicine

## 2024-11-07 VITALS — BP 130/80 | HR 76 | Temp 97.7°F | Ht 66.0 in | Wt 198.0 lb

## 2024-11-07 DIAGNOSIS — J4540 Moderate persistent asthma, uncomplicated: Secondary | ICD-10-CM | POA: Diagnosis not present

## 2024-11-07 DIAGNOSIS — E669 Obesity, unspecified: Secondary | ICD-10-CM

## 2024-11-07 DIAGNOSIS — R0602 Shortness of breath: Secondary | ICD-10-CM

## 2024-11-07 DIAGNOSIS — E559 Vitamin D deficiency, unspecified: Secondary | ICD-10-CM

## 2024-11-07 DIAGNOSIS — G4734 Idiopathic sleep related nonobstructive alveolar hypoventilation: Secondary | ICD-10-CM

## 2024-11-07 DIAGNOSIS — J309 Allergic rhinitis, unspecified: Secondary | ICD-10-CM | POA: Diagnosis not present

## 2024-11-07 DIAGNOSIS — Z7722 Contact with and (suspected) exposure to environmental tobacco smoke (acute) (chronic): Secondary | ICD-10-CM

## 2024-11-07 MED ORDER — ALBUTEROL SULFATE HFA 108 (90 BASE) MCG/ACT IN AERS: 1.0000 | INHALATION_SPRAY | Freq: Four times a day (QID) | RESPIRATORY_TRACT | 12 refills | Status: AC | PRN

## 2024-11-07 MED ORDER — ALBUTEROL SULFATE HFA 108 (90 BASE) MCG/ACT IN AERS: 2.0000 | INHALATION_SPRAY | Freq: Four times a day (QID) | RESPIRATORY_TRACT | 2 refills | Status: DC | PRN

## 2024-11-07 NOTE — Progress Notes (Signed)
 Name: Monica Hardin MRN: [id number] DOB: [birth year]    SYNOPSIS Establish care for findings consistent with asthma PFTs do not show obstructive lung disease No evidence of sleep apnea    TESTS Pulmonary function testing February 27, 2024 Reviewed in detail with patient FEV1 FVC ratio postbronchodilator is 80% predicted FEV1 79% predicted Significant bronchodilator response FVC is 62% predicted TLC was 72% predicted Findings to suggest some restrictive lung disease No significant abnormality in the DLCO Flow-volume loops showed low lung volumes  PSG 08/2023 did NOT show significant AHI which was 2.4 Overnight pulse oximetry did not show any type of significant hypoxia Overnight pulse oximetry ordered-patient now on oxygen therapy, just barely met the criteria Advised to continue oxygen as prescribed  March 2025 FENO  20    CHIEF COMPLAINT:  Moderate persistent asthma evaluation   HISTORY OF PRESENT ILLNESS: Follow-up assessment moderate persistent asthma Patient has significant secondhand smoking history for approximately 25 years of her life Continues to take Symbicort  as prescribed Rinse his mouth out after use   No exacerbation at this time No evidence of heart failure at this time No evidence or signs of infection at this time No respiratory distress No fevers, chills, nausea, vomiting, diarrhea No evidence of lower extremity edema No evidence hemoptysis   Patient has sinus congestion Patient continues to take Flonase  Zyrtec Symptoms are controlled  Patient was prescribed nocturnal oxygen We will plan to reassess this   PAST MEDICAL HISTORY :   has a past medical history of Asthma, Cancer (HCC), 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), Chronic fatigue, Disturbance of skin sensation (07/04/2015), Dyspnea (02/27/2024), Fibromyalgia (06/15/2018), Hyperlipidemia, Hypothyroidism, Lipoma of arm (12/03/2014), Low vitamin B12 level  (04/20/2016), 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  has a past surgical history that includes LEEP (8001-8000); Mandible fracture surgery (1996); laparoscopy (N/A, 08/24/2015); Fracture surgery; Abdominal hysterectomy (N/A, 10/05/2015); Salpingoophorectomy (Bilateral, 10/05/2015); Colonoscopy with propofol  (N/A, 11/22/2016); Cholecystectomy (N/A, 03/27/2017); and Knee Arthroplasty (Right, 06/10/2024). Prior to Admission medications   Medication Sig Start Date End Date Taking? Authorizing Provider  Cyanocobalamin (B-12) 1000 MCG SUBL Place 1 tablet under the tongue daily. 02/27/23   Sowles, Krichna, MD  DULoxetine  (CYMBALTA ) 60 MG capsule Take 1 capsule (60 mg total) by mouth daily. 11/13/23   Sowles, Krichna, MD  levothyroxine  (SYNTHROID ) 100 MCG tablet Take 100 mcg by mouth daily. 06/19/23   [provider]  liothyronine  (CYTOMEL ) 5 MCG tablet Take 5 mcg by mouth daily. 06/19/23   [provider]  Magnesium  Glycinate 100 MG CAPS Take 1 capsule by mouth daily at 12 noon. 02/28/23   Sowles, Krichna, MD  pregabalin  (LYRICA ) 75 MG capsule Take 1-2 capsules (75-150 mg total) by mouth 3 (three) times daily. 1 in am, 1 in pm and two at bedtime 11/13/23   Sowles, Krichna, MD  traZODone  (DESYREL ) 100 MG tablet Take 1 tablet (100 mg total) by mouth at bedtime. 11/13/23   Sowles, Krichna, MD  Vitamin D , Ergocalciferol , (DRISDOL ) 1.25 MG (50000 UNIT) CAPS capsule Take 1 capsule (50,000 Units total) by mouth once a week. 06/09/23   Sowles, Krichna, MD   Allergies  Allergen  Reactions   Statins  myalgia    FAMILY HISTORY:  family history includes Asthma in her son; Breast cancer in her paternal aunt; Breast cancer (age of onset: 52) in her paternal aunt; Breast cancer (age of onset: 74) in her sister; Diabetes in her paternal grandmother; Heart attack in her maternal grandfather; Kidney Stones in her daughter; Lung cancer in her paternal grandfather and paternal uncle; Lung cancer (age of onset: 78) in her father; Lung cancer (age of onset: 59) in her mother; Non-Hodgkin's lymphoma in her paternal uncle; Other in her sister; Thyroid  disease in her sister. SOCIAL HISTORY:  reports that she has never smoked. She has never used smokeless tobacco. She reports that she does not drink alcohol and does not use drugs.    BP 130/80   Pulse 76   Temp 97.7 F (36.5 C)   Ht 5' 6 (1.676 m)   Wt 198 lb (89.8 kg)   LMP 09/29/2015 Comment: upreg neg  SpO2 97%   BMI 31.96 kg/m    Physical Examination:  General Appearance: No distress  EYES EOM intact.   NECK Supple, No JVD Pulmonary: normal breath sounds, No wheezing.  CardiovascularNormal S1,S2.  No m/r/g.   Ext pulses intact, cap refill intact  ALL OTHER ROS ARE NEGATIVE  CBC    Component Value Date/Time   WBC 5.7 05/31/2024 0940   RBC 4.33 05/31/2024 0940   HGB 13.2 05/31/2024 0940   HGB 14.2 11/13/2023 1039   HCT 40.2 05/31/2024 0940   HCT 45.1 11/13/2023 1039   PLT 295 05/31/2024 0940   PLT 328 11/13/2023 1039   MCV 92.8 05/31/2024 0940   MCV 94 11/13/2023 1039   MCH 30.5 05/31/2024 0940   MCHC 32.8 05/31/2024 0940   RDW 12.4 05/31/2024 0940   RDW 12.5 11/13/2023 1039   LYMPHSABS 2.5 11/13/2023 1039   MONOABS 0.3 10/01/2015 1040   EOSABS 0.2 11/13/2023 1039   BASOSABS 0.0 11/13/2023 1039      Latest Ref Rng & Units 05/31/2024    9:40 AM 12/29/2023   11:39 AM 11/13/2023   10:39 AM  BMP  Glucose 70 - 99 mg/dL 877  97  878   BUN 6 - 20 mg/dL 19  17  16    Creatinine 0.44 - 1.00 mg/dL 9.19   9.06  8.99   BUN/Creat Ratio 9 - 23   16   Sodium 135 - 145 mmol/L 139  137  139   Potassium 3.5 - 5.1 mmol/L 3.6  3.5  4.2   Chloride 98 - 111 mmol/L 101  102  99   CO2 22 - 32 mmol/L 29  25  28    Calcium  8.9 - 10.3 mg/dL 9.2  8.9  9.7        ASSESSMENT AND PLAN SYNOPSIS 58 year old obese white female seen today for follow-up assessment for progressive shortness of breath and dyspnea on exertion over the last several years with significant secondhand smoke exposure history family lung cancer consistent with reactive airways disease and asthma with positive bronchodilator response, diagnosis of moderate persistent asthma  Assessment of shortness of breath Persistent asthma Symbicort  does help We will prescribe albuterol  as needed Allergic rhinitis controlled with Flonase  and Zyrtec Avoid Allergens and Irritants Avoid secondhand smoke Avoid SICK contacts Recommend  Masking  when appropriate Recommend Keep up-to-date with vaccinations   Allergic rhinitis Flonase  2 sprays each nostril daily Continue Zyrtec   Regarding assessment of OSA Patient does not have significant OSA that would require CPAP therapy  Shortness of breath with normal chest x-ray Previous CT chest did not show significant abnormalities  Obesity -recommend significant weight loss -recommend changing diet  Deconditioned state -Recommend increased daily activity and exercise   Follow-up with endocrinology Recommend weight loss   MEDICATION ADJUSTMENTS/LABS AND TESTS ORDERED: Symbicort  Inhaler 2 puffs in AM and 2 puffs in PM Please rinse mouth after every use Recommend weight loss Continue oxygen as prescribed Plan to repeat overnight pulse oximetry to assess for nocturnal hypoxia    CURRENT MEDICATIONS REVIEWED AT LENGTH WITH PATIENT TODAY   Patient  satisfied with Plan of action and management. All questions answered   Follow up 6 months   I spent a total of 41 minutes dedicated to  the care of this patient on the date of this encounter to include pre-visit review of records, face-to-face time with the patient discussing conditions above, post visit ordering of testing, clinical documentation with the electronic health record, making appropriate referrals as documented, and communicating necessary information to the patient's healthcare team.    The Patient requires high complexity decision making for assessment and support, frequent evaluation and titration of therapies, application of advanced monitoring technologies and extensive interpretation of multiple databases.  Patient satisfied with Plan of action and management. All questions answered    Nickolas Alm Cellar, M.D.  [facility] Pulmonary & Critical Care Medicine  Medical Director [facility]

## 2024-11-07 NOTE — Telephone Encounter (Signed)
 Too soon for refill, LRF 08/21/24 FOR 90, 1 RF.  Requested Prescriptions  Pending Prescriptions Disp Refills   DULoxetine  (CYMBALTA ) 60 MG capsule [Pharmacy Med Name: DULOXETINE  DR 60MG  CAPSULES] 90 capsule 1    Sig: TAKE 1 CAPSULE(60 MG) BY MOUTH DAILY     Psychiatry: Antidepressants - SNRI - duloxetine  Passed - 11/07/2024 11:06 AM      Passed - Cr in normal range and within 360 days    Creatinine, Ser  Date Value Ref Range Status  05/31/2024 0.80 0.44 - 1.00 mg/dL Final         Passed - eGFR is 30 or above and within 360 days    GFR calc Af Amer  Date Value Ref Range Status  09/18/2020 87 >59 mL/min/1.73 Final    Comment:    **[facility] currently reports eGFR in compliance with the current**   recommendations of the [employer]. [facility] will   update reporting as new guidelines are published from the NKF-ASN   Task force.    GFR, Estimated  Date Value Ref Range Status  05/31/2024 >60 >60 mL/min Final    Comment:    (NOTE) Calculated using the CKD-EPI Creatinine Equation (2021)    eGFR  Date Value Ref Range Status  11/13/2023 65 >59 mL/min/1.73 Final         Passed - Completed PHQ-2 or PHQ-9 in the last 360 days      Passed - Last BP in normal range    BP Readings from Last 1 Encounters:  10/01/24 128/78         Passed - Valid encounter within last 6 months    Recent Outpatient Visits           1 month ago Obesity (BMI 30.0-34.9)   [facility] Glenard Mire, MD   2 months ago CFIDS (chronic fatigue and immune dysfunction syndrome)   [facility] Sowles, Krichna, MD   6 months ago Acute otalgia, left   [facility] Glenard Mire, MD   8 months ago CFIDS (chronic fatigue and immune dysfunction syndrome)   [facility] Sowles, Krichna, MD       Future Appointments             In 1 month Glenard, Krichna, MD [facility], [place]

## 2024-11-07 NOTE — Patient Instructions (Signed)
 Continue Symbicort  as prescribed Continue Flonase  as prescribed Continue Zyrtec Rinse mouth after use  Repeat overnight pulse oximetry  Avoid Allergens and Irritants Avoid secondhand smoke Avoid SICK contacts Recommend  Masking  when appropriate Recommend Keep up-to-date with vaccinations

## 2024-11-08 ENCOUNTER — Encounter: Payer: Self-pay | Admitting: Licensed Clinical Social Worker

## 2024-11-08 ENCOUNTER — Telehealth: Payer: Self-pay | Admitting: Licensed Clinical Social Worker

## 2024-11-08 DIAGNOSIS — Z1379 Encounter for other screening for genetic and chromosomal anomalies: Secondary | ICD-10-CM | POA: Insufficient documentation

## 2024-11-08 NOTE — Telephone Encounter (Signed)
 I contacted Ms. Slaugh to discuss her genetic testing results. No pathogenic variants were identified in the 77 genes analyzed. Detailed clinic note to follow.   The test report has been scanned into EPIC and is located under the Molecular Pathology section of the Results Review tab.  A portion of the result report is included below for reference.      Dena Cary, MS, [facility] Genetic Counselor [email or web address] Phone: [phone]

## 2024-11-09 NOTE — Telephone Encounter (Signed)
 Requested medication (s) are due for refill today: yes  Requested medication (s) are on the active medication list: yes  Last refill:  08/07/24  Future visit scheduled: yes  Notes to clinic:   Manual Review: Route requests for 50,000 IU strength to the provider      Requested Prescriptions  Pending Prescriptions Disp Refills   Vitamin D , Ergocalciferol , (DRISDOL ) 1.25 MG (50000 UNIT) CAPS capsule [Pharmacy Med Name: VITAMIN D2 50,000IU (ERGO) CAP RX] 12 capsule 0    Sig: TAKE 1 CAPSULE BY MOUTH 1 TIME A WEEK     Endocrinology:  Vitamins - Vitamin D  Supplementation 2 Failed - 11/09/2024  8:59 AM      Failed - Manual Review: Route requests for 50,000 IU strength to the provider      Failed - Vitamin D  in normal range and within 360 days    Vit D, 25-Hydroxy  Date Value Ref Range Status  11/13/2023 45.8 30.0 - 100.0 ng/mL Final    Comment:    Vitamin D  deficiency has been defined by the Institute of Medicine and an Endocrine Society practice guideline as a level of serum 25-OH vitamin D  less than 20 ng/mL (1,2). The Endocrine Society went on to further define vitamin D  insufficiency as a level between 21 and 29 ng/mL (2). 1. IOM (Institute of Medicine). 2010. Dietary reference    intakes for calcium  and D. [place]: The    [employer]. 2. Holick MF, [place], Bischoff-Ferrari HA, et al.    Evaluation, treatment, and prevention of vitamin D     deficiency: an Endocrine Society clinical practice    guideline. JCEM. 2011 Jul; 96(7):1911-30.          Passed - Ca in normal range and within 360 days    Calcium   Date Value Ref Range Status  05/31/2024 9.2 8.9 - 10.3 mg/dL Final         Passed - Valid encounter within last 12 months    Recent Outpatient Visits           1 month ago Obesity (BMI 30.0-34.9)   [facility] Glenard Mire, MD   2 months ago CFIDS (chronic fatigue and immune dysfunction syndrome)   [facility] Sowles, Krichna, MD   6 months ago Acute otalgia, left   [facility] Glenard Mire, MD   8 months ago CFIDS (chronic fatigue and immune dysfunction syndrome)   [facility] Sowles, Krichna, MD       Future Appointments             In 1 month Glenard, Krichna, MD [facility], [place]

## 2024-11-11 ENCOUNTER — Ambulatory Visit
Admission: RE | Admit: 2024-11-11 | Discharge: 2024-11-11 | Disposition: A | Discharge status: Home / Self Care | Source: Ambulatory Visit | Attending: Family Medicine | Admitting: Family Medicine

## 2024-11-11 ENCOUNTER — Other Ambulatory Visit: Payer: Self-pay | Admitting: Family Medicine

## 2024-11-11 DIAGNOSIS — E559 Vitamin D deficiency, unspecified: Secondary | ICD-10-CM

## 2024-11-11 DIAGNOSIS — Z1231 Encounter for screening mammogram for malignant neoplasm of breast: Secondary | ICD-10-CM | POA: Insufficient documentation

## 2024-11-12 ENCOUNTER — Ambulatory Visit: Payer: Self-pay | Admitting: Licensed Clinical Social Worker

## 2024-11-12 DIAGNOSIS — Z1379 Encounter for other screening for genetic and chromosomal anomalies: Secondary | ICD-10-CM

## 2024-11-12 NOTE — Progress Notes (Signed)
 HPI:   Monica Hardin was previously seen in the [facility] due to a family history of cancer, her sister's recent genetic testing that showed a DDX41 mutation, and concerns regarding a hereditary predisposition to cancer. Please refer to our prior cancer genetics clinic note for more information regarding our discussion, assessment and recommendations, at the time. Monica Hardin recent genetic test results were disclosed to her, as were recommendations warranted by these results. These results and recommendations are discussed in more detail below.  CANCER HISTORY:  Oncology History   No history exists.    FAMILY HISTORY:  We obtained a detailed, 4-generation family history.  Significant diagnoses are listed below: Family History  Problem Relation Age of Onset   Lung cancer Mother 56   Lung cancer Father 23   Thyroid  disease Sister    Breast cancer Sister 18   Other Sister        DDX41+   Breast cancer Paternal Aunt 57   Breast cancer Paternal Aunt    Lung cancer Paternal Uncle    Non-Hodgkin's lymphoma Paternal Uncle    Heart attack Maternal Grandfather    Diabetes Paternal Grandmother    Lung cancer Paternal Grandfather    Kidney Stones Daughter    Asthma Son     Monica Hardin has 1 son and 1 daughter. She has 1 sister who was recently diagnosed with breast cancer at 3 and incidentally found to have a DDX41 mutation.    Monica Hardin mother passed of lung cancer at 19. A maternal aunt had unknown cancer. A maternal cousin had unknown cancer.    Monica Hardin father passed of lung cancer at 68. Two paternal aunts had breast cancer, one passed of it in her 42s and the other had it in her 57s and is living in her 49s. A paternal uncle and grandfather had lung cancer. Paternal uncle died of Non-Hodgkin's lymphoma in his teens.    Monica Hardin is aware of previous family history of genetic testing for hereditary cancer risks. There is no reported Ashkenazi Jewish  ancestry. There is no known consanguinity.    GENETIC TEST RESULTS:  The [facility] CancerNext-Expanded+RNA Panel found no pathogenic mutations.   The CancerNext-Expanded gene panel offered by [facility] and includes sequencing, rearrangement, and RNA analysis for the following 77 genes: AIP, ALK, APC, ATM, AXIN2, BAP1, BARD1, BMPR1A, BRCA1, BRCA2, BRIP1, CDC73, CDH1, CDK4, CDKN1B, CDKN2A, CEBPA, CHEK2, CTNNA1, DDX41, DICER1, ETV6, FH, FLCN, GATA2, LZTR1, MAX, MBD4, MEN1, MET, MLH1, MSH2, MSH3, MSH6, MUTYH, NF1, NF2, NTHL1, PALB2, PHOX2B, PMS2, POT1, PRKAR1A, PTCH1, PTEN, RAD51C, RAD51D, RB1, RET, RPS20, RUNX1, SDHA, SDHAF2, SDHB, SDHC, SDHD, SMAD4, SMARCA4, SMARCB1, SMARCE1, STK11, SUFU, TMEM127, TP53, TSC1, TSC2, VHL, and WT1 (sequencing and deletion/duplication); EGFR, HOXB13, KIT, MITF, PDGFRA, POLD1, and POLE (sequencing only); EPCAM and GREM1 (deletion/duplication only).   The test report has been scanned into EPIC and is located under the Molecular Pathology section of the Results Review tab.  A portion of the result report is included below for reference. Genetic testing reported out on 11/07/2024.     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  At this time, it is unknown if this variant is associated with an increased risk for cancer or if it is benign, but most uncertain variants are reclassified to benign. It should not be used to make medical management decisions. With time, we suspect the laboratory will determine the significance of this  variant, if any. If the laboratory reclassifies this variant, we will attempt to contact Monica Hardin to discuss it further.   Even though a pathogenic variant was not identified, possible explanations for the cancer in the family may include: There may be no hereditary risk for cancer in the family. The cancers in Monica Hardin and/or her family may be sporadic/familial or due to other genetic and environmental  factors. There may be a gene mutation in one of these genes that current testing methods cannot detect but that chance is small. There could be another gene that has not yet been discovered, or that we have not yet tested, that is responsible for the cancer diagnoses in the family.  It is also possible there is a hereditary cause for the cancer in the family that Monica Hardin did not inherit.  Therefore, it is important to remain in touch with cancer genetics in the future so that we can continue to offer Monica Hardin the most up to date genetic testing.   ADDITIONAL GENETIC TESTING:  We discussed with Monica Hardin that her genetic testing was fairly extensive.  If there are additional relevant genes identified to increase cancer risk that can be analyzed in the future, we would be happy to discuss and coordinate this testing at that time.  CANCER SCREENING RECOMMENDATIONS:  Monica Hardin test result is considered negative (normal).  This means that we have not identified a hereditary cause for her family history of cancer at this time.  An individual's cancer risk and medical management are not determined by genetic test results alone. Overall cancer risk assessment incorporates additional factors, including personal medical history, family history, and any available genetic information that may result in a personalized plan for cancer prevention and surveillance. Therefore, it is recommended she continue to follow the cancer management and screening guidelines provided by her primary healthcare provider.   Breast Cancer Screening:  The Tyrer-Cuzick model is one of multiple prediction models developed to estimate an individual's lifetime risk of developing breast cancer. The Tyrer-Cuzick model is endorsed by the [employer] (NCCN). This model includes many risk factors such as family history, endogenous estrogen exposure, and benign breast disease. The calculation is  highly-dependent on the accuracy of clinical data provided by the patient and can change over time. The Tyrer-Cuzick model may be repeated to reflect new information in her personal or family history in the future.   Ms. Raulston'sTyrer-Cuzick risk score is 13.9%.  She is encouraged to continue to be mindful of her family history and be diligent with general population breast screening, including annual mammograms beginning 10 years prior to the youngest diagnosis in her family or by age 42.  She is encouraged to contact us  regarding any changes to her personal or family history, as her recommendations for screening would be altered significantly if her lifetime risk is determined to be greater than 20% based on updated information.    RECOMMENDATIONS FOR FAMILY MEMBERS:   Since she did not inherit a identifiable mutation in a cancer predisposition gene included on this panel, her children could not have inherited a known mutation from her in one of these genes. Individuals in this family might be at some increased risk of developing cancer, over the general population risk, due to the family history of cancer.  Individuals in the family should notify their providers of the family history of cancer. We recommend women in this family have a yearly mammogram beginning at age 88,  or 10 years younger than the earliest onset of cancer, an annual clinical breast exam, and perform monthly breast self-exams.  Family members should have colonoscopies by at age 54, or earlier, as recommended by their providers. Other members of the family may still carry a pathogenic variant in one of these genes that Monica Hardin did not inherit. Based on the family history, we recommend her paternal relatives have genetic counseling and testing, and those closely related to her sister have genetic testing that includes the DDX41 gene. Monica Hardin will let us  know if we can be of any assistance in coordinating genetic counseling and/or  testing for this family member.   We do not recommend familial testing for the ETV6 variant of uncertain significance (VUS).  FOLLOW-UP:  Lastly, we discussed with Monica Hardin that cancer genetics is a rapidly advancing field and it is possible that new genetic tests will be appropriate for her and/or her family members in the future. We encouraged her to remain in contact with cancer genetics on an annual basis so we can update her personal and family histories and let her know of advances in cancer genetics that may benefit this family.   Our contact number was provided. Ms. Wangerin questions were answered to her satisfaction, and she knows she is welcome to call us  at anytime with additional questions or concerns.    Dena Cary, MS, [facility] Genetic Counselor [email or web address] Phone: [phone]

## 2024-11-13 NOTE — Telephone Encounter (Signed)
 Too soon for refill.  Requested Prescriptions  Pending Prescriptions Disp Refills   Vitamin D , Ergocalciferol , (DRISDOL ) 1.25 MG (50000 UNIT) CAPS capsule [Pharmacy Med Name: VITAMIN D2 50,000IU (ERGO) CAP RX] 12 capsule 0    Sig: TAKE 1 CAPSULE BY MOUTH 1 TIME A WEEK     Endocrinology:  Vitamins - Vitamin D  Supplementation 2 Failed - 11/13/2024  4:23 PM      Failed - Manual Review: Route requests for 50,000 IU strength to the provider      Failed - Vitamin D  in normal range and within 360 days    Vit D, 25-Hydroxy  Date Value Ref Range Status  11/13/2023 45.8 30.0 - 100.0 ng/mL Final    Comment:    Vitamin D  deficiency has been defined by the Institute of Medicine and an Endocrine Society practice guideline as a level of serum 25-OH vitamin D  less than 20 ng/mL (1,2). The Endocrine Society went on to further define vitamin D  insufficiency as a level between 21 and 29 ng/mL (2). 1. IOM (Institute of Medicine). 2010. Dietary reference    intakes for calcium  and D. [place]: The    [employer]. 2. Holick MF, Binkley , Bischoff-Ferrari HA, et al.    Evaluation, treatment, and prevention of vitamin D     deficiency: an Endocrine Society clinical practice    guideline. JCEM. 2011 Jul; 96(7):1911-30.          Passed - Ca in normal range and within 360 days    Calcium   Date Value Ref Range Status  05/31/2024 9.2 8.9 - 10.3 mg/dL Final         Passed - Valid encounter within last 12 months    Recent Outpatient Visits           1 month ago Obesity (BMI 30.0-34.9)   [place] [facility] Glenard Mire, MD   2 months ago CFIDS (chronic fatigue and immune dysfunction syndrome)   [facility] Sowles, Krichna, MD   6 months ago Acute otalgia, left   [facility] Glenard Mire, MD   9 months ago CFIDS (chronic fatigue and immune dysfunction syndrome)   [facility]  Sowles, Krichna, MD       Future Appointments             In 4 weeks Sowles, Krichna, MD [facility], [place]

## 2024-11-28 ENCOUNTER — Other Ambulatory Visit: Payer: Self-pay | Admitting: Family Medicine

## 2024-11-28 ENCOUNTER — Telehealth: Payer: Self-pay

## 2024-11-28 DIAGNOSIS — E66811 Obesity, class 1: Secondary | ICD-10-CM

## 2024-11-28 DIAGNOSIS — G4734 Idiopathic sleep related nonobstructive alveolar hypoventilation: Secondary | ICD-10-CM

## 2024-11-28 NOTE — Telephone Encounter (Signed)
 Received pts ONO results which Dr. Isaiah has reviewed and recommended she use 2L of O2 at night, which pt has been notified of. NFN.

## 2024-11-29 NOTE — Addendum Note (Signed)
 Addended by: Lula Michaux J on: 11/29/2024 08:10 AM   Modules accepted: Orders

## 2024-12-02 NOTE — Telephone Encounter (Signed)
 No longer current dosing of this medication  Requested Prescriptions  Pending Prescriptions Disp Refills   WEGOVY  0.25 MG/0.5ML SOAJ SQ injection [Pharmacy Med Name: WEGOVY  0.25MG /0.5ML INJ ( 4 PENS)] 2 mL 0    Sig: ADMINISTER 0.25 MG UNDER THE SKIN 1 TIME A WEEK     Endocrinology:  Diabetes - GLP-1 Receptor Agonists - semaglutide  Failed - 12/02/2024  1:11 PM      Failed - HBA1C in normal range and within 180 days    Hgb A1c MFr Bld  Date Value Ref Range Status  05/31/2024 5.7 (H) 4.8 - 5.6 % Final    Comment:    (NOTE) Diagnosis of Diabetes The following HbA1c ranges recommended by the American Diabetes Association (ADA) may be used as an aid in the diagnosis of diabetes mellitus.  Hemoglobin             Suggested A1C NGSP%              Diagnosis  <5.7                   Non Diabetic  5.7-6.4                Pre-Diabetic  >6.4                   Diabetic  <7.0                   Glycemic control for                       adults with diabetes.           Passed - Cr in normal range and within 360 days    Creatinine, Ser  Date Value Ref Range Status  05/31/2024 0.80 0.44 - 1.00 mg/dL Final         Passed - Valid encounter within last 6 months    Recent Outpatient Visits           2 months ago Obesity (BMI 30.0-34.9)   [place] [facility] Glenard Mire, MD   3 months ago CFIDS (chronic fatigue and immune dysfunction syndrome)   [facility] Sowles, Krichna, MD   6 months ago Acute otalgia, left   [facility] Glenard Mire, MD   9 months ago CFIDS (chronic fatigue and immune dysfunction syndrome)   [facility] Sowles, Krichna, MD       Future Appointments             In 1 week Sowles, Krichna, MD [facility], [place]

## 2024-12-04 ENCOUNTER — Encounter: Payer: Self-pay | Admitting: Family Medicine

## 2024-12-05 ENCOUNTER — Telehealth: Payer: Self-pay | Admitting: Family Medicine

## 2024-12-06 ENCOUNTER — Telehealth: Payer: Self-pay

## 2024-12-06 ENCOUNTER — Other Ambulatory Visit: Payer: Self-pay | Admitting: Family Medicine

## 2024-12-06 MED ORDER — WEGOVY 1.7 MG/0.75ML ~~LOC~~ SOAJ: 1.7000 mg | SUBCUTANEOUS | 0 refills | Status: DC

## 2024-12-06 NOTE — Telephone Encounter (Signed)
 Copied from CRM #[id number]. Topic: Clinical - Medication Refill >> Dec 05, 2024  9:33 AM Berneda F wrote: Medication: semaglutide -weight management (WEGOVY ) 1 MG/0.5ML SOAJ SQ injection  Has the patient contacted their pharmacy? Yes (Agent: If no, request that the patient contact the pharmacy for the refill. If patient does not wish to contact the pharmacy document the reason why and proceed with request.) (Agent: If yes, when and what did the pharmacy advise?)  This is the patient's preferred pharmacy:  [id number] [place], [place] - [address] AT [facility] OF [address] [address] [place] [address] Phone: [phone] Fax: [phone]  Is this the correct pharmacy for this prescription? Yes If no, delete pharmacy and type the correct one.   Has the prescription been filled recently? No  Is the patient out of the medication? Yes  Has the patient been seen for an appointment in the last year OR does the patient have an upcoming appointment? Yes  Can we respond through MyChart? Yes  Agent: Please be advised that Rx refills may take up to 3 business days. We ask that you follow-up with your pharmacy.

## 2024-12-06 NOTE — Telephone Encounter (Signed)
 Sent yesterday

## 2024-12-11 NOTE — Patient Instructions (Incomplete)
 Preventive Care 59-59 Years Old, Female  Preventive care refers to lifestyle choices and visits with your health care provider that can promote health and wellness. Preventive care visits are also called wellness exams.  What can I expect for my preventive care visit?  Counseling  Your health care provider may ask you questions about your:  Medical history, including:  Past medical problems.  Family medical history.  Pregnancy history.  Current health, including:  Menstrual cycle.  Method of birth control.  Emotional well-being.  Home life and relationship well-being.  Sexual activity and sexual health.  Lifestyle, including:  Alcohol, nicotine or tobacco, and drug use.  Access to firearms.  Diet, exercise, and sleep habits.  Work and work Astronomer.  Sunscreen use.  Safety issues such as seatbelt and bike helmet use.  Physical exam  Your health care provider will check your:  Height and weight. These may be used to calculate your BMI (body mass index). BMI is a measurement that tells if you are at a healthy weight.  Waist circumference. This measures the distance around your waistline. This measurement also tells if you are at a healthy weight and may help predict your risk of certain diseases, such as type 2 diabetes and high blood pressure.  Heart rate and blood pressure.  Body temperature.  Skin for abnormal spots.  What immunizations do I need?    Vaccines are usually given at various ages, according to a schedule. Your health care provider will recommend vaccines for you based on your age, medical history, and lifestyle or other factors, such as travel or where you work.  What tests do I need?  Screening  Your health care provider may recommend screening tests for certain conditions. This may include:  Lipid and cholesterol levels.  Diabetes screening. This is done by checking your blood sugar (glucose) after you have not eaten for a while (fasting).  Pelvic exam and Pap test.  Hepatitis B test.  Hepatitis C  test.  HIV (human immunodeficiency virus) test.  STI (sexually transmitted infection) testing, if you are at risk.  Lung cancer screening.  Colorectal cancer screening.  Mammogram. Talk with your health care provider about when you should start having regular mammograms. This may depend on whether you have a family history of breast cancer.  BRCA-related cancer screening. This may be done if you have a family history of breast, ovarian, tubal, or peritoneal cancers.  Bone density scan. This is done to screen for osteoporosis.  Talk with your health care provider about your test results, treatment options, and if necessary, the need for more tests.  Follow these instructions at home:  Eating and drinking    Eat a diet that includes fresh fruits and vegetables, whole grains, lean protein, and low-fat dairy products.  Take vitamin and mineral supplements as recommended by your health care provider.  Do not drink alcohol if:  Your health care provider tells you not to drink.  You are pregnant, may be pregnant, or are planning to become pregnant.  If you drink alcohol:  Limit how much you have to 0-1 drink a day.  Know how much alcohol is in your drink. In the [place] one drink equals one 12 oz bottle of beer (355 mL), one 5 oz glass of wine (148 mL), or one 1 oz glass of hard liquor (44 mL).  Lifestyle  Brush your teeth every morning and night with fluoride toothpaste. Floss one time each day.  Exercise for at least  30 minutes 5 or more days each week.  Do not use any products that contain nicotine or tobacco. These products include cigarettes, chewing tobacco, and vaping devices, such as e-cigarettes. If you need help quitting, ask your health care provider.  Do not use drugs.  If you are sexually active, practice safe sex. Use a condom or other form of protection to prevent STIs.  If you do not wish to become pregnant, use a form of birth control. If you plan to become pregnant, see your health care provider for a  prepregnancy visit.  Take aspirin only as told by your health care provider. Make sure that you understand how much to take and what form to take. Work with your health care provider to find out whether it is safe and beneficial for you to take aspirin daily.  Find healthy ways to manage stress, such as:  Meditation, yoga, or listening to music.  Journaling.  Talking to a trusted person.  Spending time with friends and family.  Minimize exposure to UV radiation to reduce your risk of skin cancer.  Safety  Always wear your seat belt while driving or riding in a vehicle.  Do not drive:  If you have been drinking alcohol. Do not ride with someone who has been drinking.  When you are tired or distracted.  While texting.  If you have been using any mind-altering substances or drugs.  Wear a helmet and other protective equipment during sports activities.  If you have firearms in your house, make sure you follow all gun safety procedures.  Seek help if you have been physically or sexually abused.  What's next?  Visit your health care provider once a year for an annual wellness visit.  Ask your health care provider how often you should have your eyes and teeth checked.  Stay up to date on all vaccines.  This information is not intended to replace advice given to you by your health care provider. Make sure you discuss any questions you have with your health care provider.  Document Revised: 06/09/2021 Document Reviewed: 06/09/2021  Elsevier Patient Education  2024 [employer].

## 2024-12-12 ENCOUNTER — Encounter: Payer: Self-pay | Admitting: Family Medicine

## 2024-12-30 ENCOUNTER — Encounter: Payer: Self-pay | Admitting: Family Medicine

## 2024-12-30 ENCOUNTER — Ambulatory Visit (INDEPENDENT_AMBULATORY_CARE_PROVIDER_SITE_OTHER): Admitting: Family Medicine

## 2024-12-30 VITALS — BP 126/74 | HR 68 | Resp 16 | Ht 66.0 in | Wt 191.2 lb

## 2024-12-30 DIAGNOSIS — G4734 Idiopathic sleep related nonobstructive alveolar hypoventilation: Secondary | ICD-10-CM | POA: Insufficient documentation

## 2024-12-30 DIAGNOSIS — Z01419 Encounter for gynecological examination (general) (routine) without abnormal findings: Secondary | ICD-10-CM | POA: Diagnosis not present

## 2024-12-30 DIAGNOSIS — J45909 Unspecified asthma, uncomplicated: Secondary | ICD-10-CM | POA: Insufficient documentation

## 2024-12-30 DIAGNOSIS — N393 Stress incontinence (female) (male): Secondary | ICD-10-CM | POA: Diagnosis not present

## 2024-12-30 NOTE — Progress Notes (Signed)
 Name: Monica Hardin   MRN: [id number]    DOB: 10-22-1966   Date:12/30/2024       Progress Note  Subjective  Chief Complaint  Chief Complaint  Patient presents with   Annual Exam    HPI  Patient presents for annual CPE.  Diet: she has been focusing on increasing protein intake  Exercise:  water aerobics 2-3 times per week for 45 minutes  Last Eye Exam: completed Last Dental Exam: completed  Flowsheet Row Office Visit from 12/30/2024 in [facility]  AUDIT-C Score 0   Depression: Phq 9 is  negative    12/30/2024   12:52 PM 10/01/2024    2:42 PM 08/21/2024    9:00 AM 05/08/2024   11:11 AM 02/14/2024    7:51 AM  Depression screen PHQ 2/9  Decreased Interest 0 0 0 0 0  Down, Depressed, Hopeless 0 0 0 0 0  PHQ - 2 Score 0 0 0 0 0  Altered sleeping     0  Tired, decreased energy     0  Change in appetite     0  Feeling bad or failure about yourself      0  Trouble concentrating     0  Moving slowly or fidgety/restless     0  Suicidal thoughts     0  PHQ-9 Score     0   Difficult doing work/chores     Not difficult at all     Data saved with a previous flowsheet row definition   Hypertension: BP Readings from Last 3 Encounters:  12/30/24 126/74  11/07/24 130/80  10/01/24 128/78   Obesity: Wt Readings from Last 3 Encounters:  12/30/24 191 lb 3.2 oz (86.7 kg)  11/07/24 198 lb (89.8 kg)  10/01/24 201 lb (91.2 kg)   BMI Readings from Last 3 Encounters:  12/30/24 30.86 kg/m  11/07/24 31.96 kg/m  10/01/24 32.44 kg/m     Vaccines: reviewed with the patient.   Hep C Screening: completed STD testing and prevention (HIV/chl/gon/syphilis): Not interested  Intimate partner violence: negative screen  Sexual History : still sexually active  Menstrual History/LMP/Abnormal Bleeding: s/p hysterectomy  Discussed importance of follow up if any post-menopausal bleeding: not applicable  Incontinence Symptoms: positive for symptoms , she wears a pad  all the time since when she coughs or laughs she has stress incontinence,  discussed PT but not interested   Breast cancer:  - Last Mammogram: up to date  - BRCA gene screening: she had it done due to family history   Osteoporosis Prevention : Discussed high calcium  and vitamin D  supplementation, weight bearing exercises Bone density :up to date   Cervical cancer screening: not applicable due to hysterectomy  Skin cancer: Discussed monitoring for atypical lesions  Colorectal cancer: repeat in 2027    Lung cancer:  Low Dose CT Chest recommended if Age 67-80 years, 20 pack-year currently smoking OR have quit w/in 15years. Patient does not qualify for screen   ECG: 2025  Advanced Care Planning: A voluntary discussion about advance care planning including the explanation and discussion of advance directives.  Discussed health care proxy and Living will, and the patient was able to identify a health care proxy as husband .  Patient does have a living will and power of attorney of health care   Patient Active Problem List   Diagnosis Date Noted   Persistent asthma without complication 12/30/2024   Nocturnal hypoxemia 12/30/2024  Genetic testing 11/08/2024   History of total knee arthroplasty, right 06/10/2024   Neurogenic claudication due to lumbar spinal stenosis 12/07/2022   Varicose veins of both lower extremities 02/27/2019   Primary osteoarthritis of both hands 06/15/2018   Primary osteoarthritis of both knees 06/15/2018   Positive ANA (antinuclear antibody) 06/15/2018   Fibromyalgia 06/15/2018   Surgical menopause on hormone replacement therapy 09/01/2016   Vitamin D  deficiency 04/20/2016   Low vitamin B12 level 04/20/2016   Sciatica, right side 04/19/2016   Status post TAH-BSO 11/12/2015   Dyslipidemia 07/04/2015   Adult hypothyroidism 07/04/2015   Excess weight 07/04/2015   CFIDS (chronic fatigue and immune dysfunction syndrome) 05/08/2007    Past Surgical History:   Procedure Laterality Date   ABDOMINAL HYSTERECTOMY N/A 10/05/2015   Procedure: HYSTERECTOMY ABDOMINAL;  Surgeon: Gladis DELENA Dollar, MD;  Location: [facility] OR[facility];  Service: Gynecology;  Laterality: N/A;   CHOLECYSTECTOMY N/A 03/27/2017   Procedure: LAPAROSCOPIC CHOLECYSTECTOMY;  Surgeon: Aloysius Plant, MD;  Location: [facility];  Service: General;  Laterality: N/A;   COLONOSCOPY WITH PROPOFOL  N/A 11/22/2016   Procedure: COLONOSCOPY WITH PROPOFOL ;  Surgeon: Reyes LELON Cota, MD;  Location: [facility];  Service: Endoscopy;  Laterality: N/A;   FRACTURE SURGERY     JOINT REPLACEMENT     KNEE ARTHROPLASTY Right 06/10/2024   Procedure: ARTHROPLASTY, KNEE, TOTAL, USING IMAGELESS COMPUTER-ASSISTED NAVIGATION;  Surgeon: Mardee Lynwood SQUIBB, MD;  Location: [facility];  Service: Orthopedics;  Laterality: Right;   LAPAROSCOPY N/A 08/24/2015   Procedure: LAPAROSCOPY DIAGNOSTIC;  Surgeon: Gladis DELENA Dollar, MD;  Location: [facility];  Service: Gynecology;  Laterality: N/A;   LEEP  [id number]   [facility]   MANDIBLE FRACTURE SURGERY  1996   SALPINGOOPHORECTOMY Bilateral 10/05/2015   Procedure: SALPINGO OOPHORECTOMY;  Surgeon: Gladis DELENA Dollar, MD;  Location: [facility];  Service: Gynecology;  Laterality: Bilateral;    Family History  Problem Relation Age of Onset   Lung cancer Mother 19   Lung cancer Father 12   Thyroid  disease Sister    Breast cancer Sister 49   Other Sister        [id number]   Breast cancer Paternal Aunt 78   Breast cancer Paternal Aunt    Lung cancer Paternal Uncle    Non-Hodgkin's lymphoma Paternal Uncle    Heart attack Maternal Grandfather    Diabetes Paternal Grandmother    Lung cancer Paternal Grandfather    Kidney Stones Daughter    Asthma Son     Social History   Socioeconomic History   Marital status: Married    Spouse name: Doug   Number of children: 2   Years of education: Not on file   Highest education level: 12th grade  Occupational History   Occupation: Sales Promotion Account Executive: [employer]  Tobacco Use   Smoking status: Never    Passive exposure: Past (both parents smoke throughout childhood/growing up)   Smokeless tobacco: Never  Vaping Use   Vaping status: Never Used  Substance and Sexual Activity   Alcohol use: Never   Drug use: Never   Sexual activity: Yes    Partners: Male    Birth control/protection: None    Comment: Hysterectomy  Other Topics Concern   Not on file  Social History Narrative   Two grown children, married   Works at [employer]   Tobacco Use: Low Risk (12/30/2024)   Patient History    Smoking Tobacco Use: Never  Smokeless Tobacco Use: Never    Passive Exposure: Past  Financial Resource Strain: Low Risk (12/29/2024)   Overall Financial Resource Strain (CARDIA)    Difficulty of Paying Living Expenses: Not hard at all  Food Insecurity: No Food Insecurity (12/29/2024)   Epic    Worried About Programme Researcher, Broadcasting/film/video in the Last Year: Never true    Ran Out of Food in the Last Year: Never true  Transportation Needs: No Transportation Needs (12/29/2024)   Epic    Lack of Transportation (Medical): No    Lack of Transportation (Non-Medical): No  Physical Activity: Insufficiently Active (12/29/2024)   Exercise Vital Sign    Days of Exercise per Week: 3 days    Minutes of Exercise per Session: 30 min  Stress: No Stress Concern Present (12/29/2024)   [employer] of Occupational Health - Occupational Stress Questionnaire    Feeling of Stress: Only a little  Social Connections: Socially Integrated (12/29/2024)   Social Connection and Isolation Panel    Frequency of Communication with Friends and Family: Twice a week    Frequency of Social Gatherings with Friends and Family: Once a week    Attends Religious Services: More than 4 times per year    Active Member of Clubs or Organizations: Yes    Attends Banker Meetings: More than 4 times per year    Marital Status: Married  Catering Manager Violence:  Not At Risk (12/30/2024)   Epic    Fear of Current or Ex-Partner: No    Emotionally Abused: No    Physically Abused: No    Sexually Abused: No  Depression (PHQ2-9): Low Risk (12/30/2024)   Depression (PHQ2-9)    PHQ-2 Score: 0  Alcohol Screen: Low Risk (12/30/2024)   Alcohol Screen    Last Alcohol Screening Score (AUDIT): 0  Housing: Low Risk (12/29/2024)   Epic    Unable to Pay for Housing in the Last Year: No    Number of Times Moved in the Last Year: 0    Homeless in the Last Year: No  Utilities: Not At Risk (12/30/2024)   Epic    Threatened with loss of utilities: No  Health Literacy: Adequate Health Literacy (12/30/2024)   B1300 Health Literacy    Frequency of need for help with medical instructions: Never    Current Medications[1]  Allergies[2]   ROS  Constitutional: Negative for fever , positive for weight change.  Respiratory: Negative for cough   positive for shortness of breath with activity .   Cardiovascular: Negative for chest pain or palpitations.  Gastrointestinal: Negative for abdominal pain, no bowel changes.  Musculoskeletal: Negative for gait problem or joint swelling.  Skin: Negative for rash.  Neurological: Negative for dizziness or headache.  No other specific complaints in a complete review of systems (except as listed in HPI above).   Objective  Vitals:   12/30/24 1257  BP: 126/74  Pulse: 68  Resp: 16  SpO2: 97%  Weight: 191 lb 3.2 oz (86.7 kg)  Height: 5' 6 (1.676 m)    Body mass index is 30.86 kg/m.  Physical Exam  Constitutional: Patient appears well-developed and well-nourished. No distress.  HENT: Head: Normocephalic and atraumatic. Ears: B TMs ok, no erythema or effusion; Nose: Nose normal. Mouth/Throat: Oropharynx is clear and moist. No oropharyngeal exudate.  Eyes: Conjunctivae and EOM are normal. Pupils are equal, round, and reactive to light. No scleral icterus.  Neck: Normal range of motion. Neck supple. No JVD present.  No  thyromegaly present.  Cardiovascular: Normal rate, regular rhythm and normal heart sounds.  No murmur heard. No BLE edema. Pulmonary/Chest: Effort normal and breath sounds normal. No respiratory distress. Abdominal: Soft. Bowel sounds are normal, no distension. There is no tenderness. no masses Breast: no lumps or masses, no nipple discharge or rashes FEMALE GENITALIA:  Not done  RECTAL: not done  Musculoskeletal: Normal range of motion, no joint effusions. No gross deformities Neurological: he is alert and oriented to person, place, and time. No cranial nerve deficit. Coordination, balance, strength, speech and gait are normal.  Skin: Skin is warm and dry. No rash noted. No erythema.  Psychiatric: Patient has a normal mood and affect. behavior is normal. Judgment and thought content normal.     Assessment & Plan   1. Well woman exam (Primary)  - CBC with Differential/Platelet - Comprehensive metabolic panel with GFR - Lipid panel - B12 and Folate Panel - VITAMIN D  25 Hydroxy (Vit-D Deficiency, Fractures) - Hemoglobin A1c - C-reactive protein - Sedimentation rate - TSH  2. Stress incontinence  Discussed PT   -USPSTF grade A and B recommendations reviewed with patient; age-appropriate recommendations, preventive care, screening tests, etc discussed and encouraged; healthy living encouraged; see AVS for patient education given to patient -Discussed importance of 150 minutes of physical activity weekly, eat two servings of fish weekly, eat one serving of tree nuts ( cashews, pistachios, pecans, almonds.SABRA) every other day, eat 6 servings of fruit/vegetables daily and drink plenty of water and avoid sweet beverages.   -Reviewed Health Maintenance: Yes.      [1]  Current Outpatient Medications:    albuterol  (VENTOLIN  HFA) 108 (90 Base) MCG/ACT inhaler, Inhale 1-2 puffs into the lungs every 6 (six) hours as needed for wheezing or shortness of breath (or cough)., Disp: 8 g, Rfl:  12   azelastine  (ASTELIN ) 0.1 % nasal spray, Place 1 spray into both nostrils 2 (two) times daily. Use in each nostril as directed, Disp: 30 mL, Rfl: 2   budesonide -formoterol  (SYMBICORT ) 160-4.5 MCG/ACT inhaler, Inhale 2 puffs into the lungs 2 (two) times daily., Disp: 1 each, Rfl: 12   cetirizine (ZYRTEC) 10 MG tablet, Take 20 mg by mouth daily., Disp: , Rfl:    DULoxetine  (CYMBALTA ) 60 MG capsule, Take 1 capsule (60 mg total) by mouth daily., Disp: 90 capsule, Rfl: 1   fluticasone  (FLONASE ) 50 MCG/ACT nasal spray, Place 2 sprays into both nostrils daily., Disp: 1 g, Rfl: 8   levothyroxine  (SYNTHROID ) 75 MCG tablet, Take 75 mcg by mouth daily before breakfast., Disp: , Rfl:    liothyronine  (CYTOMEL ) 5 MCG tablet, Take 5 mcg by mouth daily., Disp: , Rfl:    Magnesium  Glycinate 100 MG CAPS, Take 1 capsule by mouth daily at 12 noon., Disp: 100 capsule, Rfl: 1   metFORMIN  (GLUCOPHAGE -XR) 500 MG 24 hr tablet, Take 1 tablet (500 mg total) by mouth daily with breakfast., Disp: 90 tablet, Rfl: 1   pregabalin  (LYRICA ) 75 MG capsule, TAKE 1 CAPSULE BY MOUTH EVERY MORNING AND IN EVENING AND 2 AT BEDTIME, Disp: 360 capsule, Rfl: 0   Selenium 200 MCG CAPS, Take 200 mcg by mouth daily., Disp: , Rfl:    semaglutide -weight management (WEGOVY ) 1.7 MG/0.75ML SOAJ SQ injection, Inject 1.7 mg into the skin once a week., Disp: 3 mL, Rfl: 0   traZODone  (DESYREL ) 100 MG tablet, Take 1 tablet (100 mg total) by mouth at bedtime., Disp: 90 tablet, Rfl: 1   Vitamin D , Ergocalciferol , (  DRISDOL ) 1.25 MG (50000 UNIT) CAPS capsule, TAKE 1 CAPSULE BY MOUTH 1 TIME A WEEK, Disp: 12 capsule, Rfl: 0 [2]  Allergies Allergen Reactions   Statins     myalgia

## 2024-12-31 ENCOUNTER — Other Ambulatory Visit: Payer: Self-pay | Admitting: Family Medicine

## 2025-01-01 NOTE — Telephone Encounter (Signed)
 Requested Prescriptions  Pending Prescriptions Disp Refills   WEGOVY  1.7 MG/0.75ML SOAJ SQ injection [Pharmacy Med Name: WEGOVY  1.7MG /0.75ML INJ ( 4 PENS)] 3 mL 0    Sig: INJECT 1.7 MG UNDER THE SKIN ONCE A WEEK     Endocrinology:  Diabetes - GLP-1 Receptor Agonists - semaglutide  Failed - 01/01/2025  4:12 PM      Failed - HBA1C in normal range and within 180 days    Hgb A1c MFr Bld  Date Value Ref Range Status  05/31/2024 5.7 (H) 4.8 - 5.6 % Final    Comment:    (NOTE) Diagnosis of Diabetes The following HbA1c ranges recommended by the American Diabetes Association (ADA) may be used as an aid in the diagnosis of diabetes mellitus.  Hemoglobin             Suggested A1C NGSP%              Diagnosis  <5.7                   Non Diabetic  5.7-6.4                Pre-Diabetic  >6.4                   Diabetic  <7.0                   Glycemic control for                       adults with diabetes.           Passed - Cr in normal range and within 360 days    Creatinine, Ser  Date Value Ref Range Status  05/31/2024 0.80 0.44 - 1.00 mg/dL Final         Passed - Valid encounter within last 6 months    Recent Outpatient Visits           2 days ago Well woman exam   [facility] Glenard Mire, MD   3 months ago Obesity (BMI 30.0-34.9)   [facility] Glenard Mire, MD   4 months ago CFIDS (chronic fatigue and immune dysfunction syndrome)   [facility] Sowles, Krichna, MD   7 months ago Acute otalgia, left   [facility] Glenard Mire, MD   10 months ago CFIDS (chronic fatigue and immune dysfunction syndrome)   [facility] Sowles, Krichna, MD

## 2025-01-04 ENCOUNTER — Encounter: Payer: Self-pay | Admitting: Family Medicine

## 2025-01-04 LAB — COMPREHENSIVE METABOLIC PANEL WITH GFR
ALT: 30 IU/L (ref 0–32)
AST: 23 IU/L (ref 0–40)
Albumin: 4.6 g/dL (ref 3.8–4.9)
Alkaline Phosphatase: 86 IU/L (ref 49–135)
BUN/Creatinine Ratio: 20 (ref 9–23)
BUN: 18 mg/dL (ref 6–24)
Bilirubin Total: 0.3 mg/dL (ref 0.0–1.2)
CO2: 24 mmol/L (ref 20–29)
Calcium: 9.7 mg/dL (ref 8.7–10.2)
Chloride: 101 mmol/L (ref 96–106)
Creatinine, Ser: 0.92 mg/dL (ref 0.57–1.00)
Globulin, Total: 2.3 g/dL (ref 1.5–4.5)
Glucose: 103 mg/dL — ABNORMAL HIGH (ref 70–99)
Potassium: 4.7 mmol/L (ref 3.5–5.2)
Sodium: 140 mmol/L (ref 134–144)
Total Protein: 6.9 g/dL (ref 6.0–8.5)
eGFR: 72 mL/min/1.73

## 2025-01-04 LAB — CBC WITH DIFFERENTIAL/PLATELET
Basophils Absolute: 0 x10E3/uL (ref 0.0–0.2)
Basos: 1 %
EOS (ABSOLUTE): 0.2 x10E3/uL (ref 0.0–0.4)
Eos: 3 %
Hematocrit: 43.4 % (ref 34.0–46.6)
Hemoglobin: 14 g/dL (ref 11.1–15.9)
Immature Grans (Abs): 0 x10E3/uL (ref 0.0–0.1)
Immature Granulocytes: 0 %
Lymphocytes Absolute: 2 x10E3/uL (ref 0.7–3.1)
Lymphs: 28 %
MCH: 30 pg (ref 26.6–33.0)
MCHC: 32.3 g/dL (ref 31.5–35.7)
MCV: 93 fL (ref 79–97)
Monocytes Absolute: 0.3 x10E3/uL (ref 0.1–0.9)
Monocytes: 4 %
Neutrophils Absolute: 4.6 x10E3/uL (ref 1.4–7.0)
Neutrophils: 64 %
Platelets: 338 x10E3/uL (ref 150–450)
RBC: 4.66 x10E6/uL (ref 3.77–5.28)
RDW: 13 % (ref 11.7–15.4)
WBC: 7.1 x10E3/uL (ref 3.4–10.8)

## 2025-01-04 LAB — HEMOGLOBIN A1C
Est. average glucose Bld gHb Est-mCnc: 108 mg/dL
Hgb A1c MFr Bld: 5.4 % (ref 4.8–5.6)

## 2025-01-04 LAB — B12 AND FOLATE PANEL
Folate: 3.1 ng/mL
Vitamin B-12: 412 pg/mL (ref 232–1245)

## 2025-01-04 LAB — C-REACTIVE PROTEIN: CRP: 13 mg/L — ABNORMAL HIGH (ref 0–10)

## 2025-01-04 LAB — LIPID PANEL
Chol/HDL Ratio: 3.8 ratio (ref 0.0–4.4)
Cholesterol, Total: 190 mg/dL (ref 100–199)
HDL: 50 mg/dL
LDL Chol Calc (NIH): 125 mg/dL — ABNORMAL HIGH (ref 0–99)
Triglycerides: 83 mg/dL (ref 0–149)
VLDL Cholesterol Cal: 15 mg/dL (ref 5–40)

## 2025-01-04 LAB — SEDIMENTATION RATE: Sed Rate: 24 mm/h (ref 0–40)

## 2025-01-04 LAB — TSH: TSH: 0.731 u[IU]/mL (ref 0.450–4.500)

## 2025-01-04 LAB — VITAMIN D 25 HYDROXY (VIT D DEFICIENCY, FRACTURES): Vit D, 25-Hydroxy: 48.8 ng/mL (ref 30.0–100.0)

## 2025-01-06 ENCOUNTER — Ambulatory Visit: Payer: Self-pay | Admitting: Family Medicine

## 2025-01-07 ENCOUNTER — Other Ambulatory Visit: Payer: Self-pay | Admitting: Family Medicine

## 2025-01-07 DIAGNOSIS — G8929 Other chronic pain: Secondary | ICD-10-CM

## 2025-01-07 DIAGNOSIS — E063 Autoimmune thyroiditis: Secondary | ICD-10-CM

## 2025-01-07 DIAGNOSIS — G9332 Myalgic encephalomyelitis/chronic fatigue syndrome: Secondary | ICD-10-CM

## 2025-01-14 ENCOUNTER — Encounter: Payer: Self-pay | Admitting: Family Medicine

## 2025-01-14 ENCOUNTER — Ambulatory Visit: Admitting: Family Medicine

## 2025-01-14 VITALS — BP 126/78 | HR 68 | Resp 16 | Ht 66.0 in | Wt 189.5 lb

## 2025-01-14 DIAGNOSIS — E559 Vitamin D deficiency, unspecified: Secondary | ICD-10-CM

## 2025-01-14 DIAGNOSIS — G4734 Idiopathic sleep related nonobstructive alveolar hypoventilation: Secondary | ICD-10-CM | POA: Diagnosis not present

## 2025-01-14 DIAGNOSIS — G9332 Myalgic encephalomyelitis/chronic fatigue syndrome: Secondary | ICD-10-CM | POA: Diagnosis not present

## 2025-01-14 DIAGNOSIS — E063 Autoimmune thyroiditis: Secondary | ICD-10-CM | POA: Insufficient documentation

## 2025-01-14 DIAGNOSIS — J4530 Mild persistent asthma, uncomplicated: Secondary | ICD-10-CM

## 2025-01-14 DIAGNOSIS — R7303 Prediabetes: Secondary | ICD-10-CM | POA: Diagnosis not present

## 2025-01-14 DIAGNOSIS — F5104 Psychophysiologic insomnia: Secondary | ICD-10-CM | POA: Insufficient documentation

## 2025-01-14 DIAGNOSIS — E66811 Obesity, class 1: Secondary | ICD-10-CM | POA: Diagnosis not present

## 2025-01-14 DIAGNOSIS — M797 Fibromyalgia: Secondary | ICD-10-CM | POA: Diagnosis not present

## 2025-01-14 DIAGNOSIS — D8989 Other specified disorders involving the immune mechanism, not elsewhere classified: Secondary | ICD-10-CM | POA: Diagnosis not present

## 2025-01-14 MED ORDER — PREGABALIN 75 MG PO CAPS: 75.0000 mg | ORAL_CAPSULE | Freq: Three times a day (TID) | ORAL | 1 refills | Status: AC

## 2025-01-14 MED ORDER — TRAZODONE HCL 100 MG PO TABS: 100.0000 mg | ORAL_TABLET | Freq: Every day | ORAL | 1 refills | Status: AC

## 2025-01-14 MED ORDER — WEGOVY 2.4 MG/0.75ML ~~LOC~~ SOAJ: 2.4000 mg | SUBCUTANEOUS | 0 refills | Status: AC

## 2025-01-14 MED ORDER — VITAMIN D (ERGOCALCIFEROL) 1.25 MG (50000 UNIT) PO CAPS: 50000.0000 [IU] | ORAL_CAPSULE | ORAL | 1 refills | Status: AC

## 2025-01-14 MED ORDER — DULOXETINE HCL 60 MG PO CPEP: 60.0000 mg | ORAL_CAPSULE | Freq: Every day | ORAL | 1 refills | Status: AC

## 2025-01-14 NOTE — Progress Notes (Signed)
 Name: Monica Hardin   MRN: [id number]    DOB: 02-15-1966   Date:01/14/2025       Progress Note  Subjective  Chief Complaint  Chief Complaint  Patient presents with   Medical Management of Chronic Issues   Discussed the use of AI scribe software for clinical note transcription with the patient, who gave verbal consent to proceed.  History of Present Illness Monica Hardin is a 59 year old female with obesity and Hashimoto's thyroiditis who presents for a follow-up regarding Wegovy  treatment.  She started Wegovy  at a dose of 0.25 mg in September and has gradually increased to 1.7 mg. Initially, she experienced some nausea, but no significant side effects are currently noted. Her weight has decreased from 201 lbs in October to 189.5 lbs, achieving the target of losing 5% of her original weight. She aims to reach a weight of 175 lbs. She continues to engage in water aerobics twice a week and maintains a diet high in protein, including protein shakes when busy at work.  She has Hashimoto's thyroiditis and is taking Cytomel  and levothyroxine . She takes a low dose of Cytomel  and 75 mcg of levothyroxine . She also supplements with magnesium , selenium, and fish oil.  She has fibromyalgia and is currently taking pregabalin  and duloxetine , which she finds helpful. She takes pregabalin  75 mg, one in the morning, one in the afternoon, and two at night. Duloxetine  is taken once at night. Her symptoms are manageable, although she experiences increased stiffness and pain during colder weather.  She has a history of nocturnal hypoxemia and uses supplemental oxygen at night. She does not have sleep apnea. She hopes that weight loss will improve her condition.  Persistent asthma is managed with a preventative inhaler twice daily, along with an albuterol  inhaler as needed for activity. No current wheezing or coughing is reported.  She underwent right knee arthroscopic surgery in June and reports improvement  in pain and function, although some swelling persists.  She reports improvement in her chronic fatigue syndrome.  Chronic insomnia is managed with trazodone , which helps maintain sleep. Good sleep significantly improves her overall condition.  She has a history of prediabetes and has been  taking metformin .    Patient Active Problem List   Diagnosis Date Noted   Persistent asthma without complication 12/30/2024   Nocturnal hypoxemia 12/30/2024   Stress incontinence 12/30/2024   Genetic testing 11/08/2024   History of total knee arthroplasty, right 06/10/2024   Neurogenic claudication due to lumbar spinal stenosis 12/07/2022   Varicose veins of both lower extremities 02/27/2019   Primary osteoarthritis of both hands 06/15/2018   Primary osteoarthritis of both knees 06/15/2018   Positive ANA (antinuclear antibody) 06/15/2018   Fibromyalgia 06/15/2018   Surgical menopause on hormone replacement therapy 09/01/2016   Vitamin D  deficiency 04/20/2016   Low vitamin B12 level 04/20/2016   Sciatica, right side 04/19/2016   Status post TAH-BSO 11/12/2015   Dyslipidemia 07/04/2015   Adult hypothyroidism 07/04/2015   Excess weight 07/04/2015   CFIDS (chronic fatigue and immune dysfunction syndrome) 05/08/2007    Past Surgical History:  Procedure Laterality Date   ABDOMINAL HYSTERECTOMY N/A 10/05/2015   Procedure: HYSTERECTOMY ABDOMINAL;  Surgeon: Gladis DELENA Dollar, MD;  Location: [facility] ORS;  Service: Gynecology;  Laterality: N/A;   CHOLECYSTECTOMY N/A 03/27/2017   Procedure: LAPAROSCOPIC CHOLECYSTECTOMY;  Surgeon: Aloysius Plant, MD;  Location: [facility];  Service: General;  Laterality: N/A;   COLONOSCOPY WITH PROPOFOL  N/A 11/22/2016   Procedure: COLONOSCOPY  WITH PROPOFOL ;  Surgeon: Reyes LELON Cota, MD;  Location: [facility] ENDOSCOPY;  Service: Endoscopy;  Laterality: N/A;   FRACTURE SURGERY     JOINT REPLACEMENT     KNEE ARTHROPLASTY Right 06/10/2024   Procedure: ARTHROPLASTY, KNEE,  TOTAL, USING IMAGELESS COMPUTER-ASSISTED NAVIGATION;  Surgeon: Mardee Lynwood SQUIBB, MD;  Location: [facility] OR[facility];  Service: Orthopedics;  Laterality: Right;   LAPAROSCOPY N/A 08/24/2015   Procedure: LAPAROSCOPY DIAGNOSTIC;  Surgeon: Gladis DELENA Dollar, MD;  Location: [facility];  Service: Gynecology;  Laterality: N/A;   LEEP  [id number]   [facility]   MANDIBLE FRACTURE SURGERY  1996   SALPINGOOPHORECTOMY Bilateral 10/05/2015   Procedure: SALPINGO OOPHORECTOMY;  Surgeon: Gladis DELENA Dollar, MD;  Location: [facility];  Service: Gynecology;  Laterality: Bilateral;    Family History  Problem Relation Age of Onset   Lung cancer Mother 42   Lung cancer Father 32   Thyroid  disease Sister    Breast cancer Sister 37   Other Sister        [id number]   Breast cancer Paternal Aunt 45   Breast cancer Paternal Aunt    Lung cancer Paternal Uncle    Non-Hodgkin's lymphoma Paternal Uncle    Heart attack Maternal Grandfather    Diabetes Paternal Grandmother    Lung cancer Paternal Grandfather    Kidney Stones Daughter    Asthma Son     Social History   Tobacco Use   Smoking status: Never    Passive exposure: Past (both parents smoke throughout childhood/growing up)   Smokeless tobacco: Never  Substance Use Topics   Alcohol use: Never    Current Medications[1]  Allergies[2]  I personally reviewed active problem list, medication list, allergies, family history with the patient/caregiver today.   ROS  Ten systems reviewed and is negative except as mentioned in HPI    Objective Physical Exam MEASUREMENTS: Weight- 189.5, BMI- 30.59. CONSTITUTIONAL: Patient appears well-developed and well-nourished. No distress. HEENT: Head atraumatic, normocephalic, neck supple. CARDIOVASCULAR: Normal rate, regular rhythm and normal heart sounds. No murmur heard. No BLE edema. PULMONARY: Effort normal and breath sounds normal. No respiratory distress. ABDOMINAL: There is no tenderness or distention. MUSCULOSKELETAL:  Normal gait. Without gross motor or sensory deficit. PSYCHIATRIC: Patient has a normal mood and affect. Behavior is normal. Judgment and thought content normal.  Vitals:   01/14/25 1358  BP: 126/78  Pulse: 68  Resp: 16  SpO2: 97%  Weight: 189 lb 8 oz (86 kg)  Height: 5' 6 (1.676 m)    Body mass index is 30.59 kg/m.  Recent Results (from the past 2160 hours)  Genetic Screening Order     Status: None   Collection Time: 10/23/24  9:10 AM  Result Value Ref Range   Genetic Screening Order Collected by Laboratory     Comment: Performed at [facility], [address], [place], [place] [address]  CBC with Differential/Platelet     Status: None   Collection Time: 01/03/25  8:59 AM  Result Value Ref Range   WBC 7.1 3.4 - 10.8 x10E3/uL   RBC 4.66 3.77 - 5.28 x10E6/uL   Hemoglobin 14.0 11.1 - 15.9 g/dL   Hematocrit 56.5 65.9 - 46.6 %   MCV 93 79 - 97 fL   MCH 30.0 26.6 - 33.0 pg   MCHC 32.3 31.5 - 35.7 g/dL   RDW 86.9 88.2 - 84.5 %   Platelets 338 150 - 450 x10E3/uL   Neutrophils 64 Not Estab. %  Lymphs 28 Not Estab. %   Monocytes 4 Not Estab. %   Eos 3 Not Estab. %   Basos 1 Not Estab. %   Neutrophils Absolute 4.6 1.4 - 7.0 x10E3/uL   Lymphocytes Absolute 2.0 0.7 - 3.1 x10E3/uL   Monocytes Absolute 0.3 0.1 - 0.9 x10E3/uL   EOS (ABSOLUTE) 0.2 0.0 - 0.4 x10E3/uL   Basophils Absolute 0.0 0.0 - 0.2 x10E3/uL   Immature Granulocytes 0 Not Estab. %   Immature Grans (Abs) 0.0 0.0 - 0.1 x10E3/uL  Comprehensive metabolic panel with GFR     Status: Abnormal   Collection Time: 01/03/25  8:59 AM  Result Value Ref Range   Glucose 103 (H) 70 - 99 mg/dL   BUN 18 6 - 24 mg/dL   Creatinine, Ser 9.07 0.57 - 1.00 mg/dL   eGFR 72 >40 fO/fpw/8.26   BUN/Creatinine Ratio 20 9 - 23   Sodium 140 134 - 144 mmol/L   Potassium 4.7 3.5 - 5.2 mmol/L   Chloride 101 96 - 106 mmol/L   CO2 24 20 - 29 mmol/L   Calcium  9.7 8.7 - 10.2 mg/dL   Total Protein 6.9 6.0 - 8.5 g/dL    Albumin 4.6 3.8 - 4.9 g/dL   Globulin, Total 2.3 1.5 - 4.5 g/dL   Bilirubin Total 0.3 0.0 - 1.2 mg/dL   Alkaline Phosphatase 86 49 - 135 IU/L   AST 23 0 - 40 IU/L   ALT 30 0 - 32 IU/L  Lipid panel     Status: Abnormal   Collection Time: 01/03/25  8:59 AM  Result Value Ref Range   Cholesterol, Total 190 100 - 199 mg/dL   Triglycerides 83 0 - 149 mg/dL   HDL 50 >60 mg/dL   VLDL Cholesterol Cal 15 5 - 40 mg/dL   LDL Chol Calc (NIH) 874 (H) 0 - 99 mg/dL   Chol/HDL Ratio 3.8 0.0 - 4.4 ratio    Comment:                                   T. Chol/HDL Ratio                                             Men  Women                               1/2 Avg.Risk  3.4    3.3                                   Avg.Risk  5.0    4.4                                2X Avg.Risk  9.6    7.1                                3X Avg.Risk 23.4   11.0   B12 and Folate Panel     Status: None   Collection Time: 01/03/25  8:59 AM  Result Value Ref Range  Vitamin B-12 412 232 - 1,245 pg/mL   Folate 3.1 >3.0 ng/mL    Comment: A serum folate concentration of less than 3.1 ng/mL is considered to represent clinical deficiency.   VITAMIN D  25 Hydroxy (Vit-D Deficiency, Fractures)     Status: None   Collection Time: 01/03/25  8:59 AM  Result Value Ref Range   Vit D, 25-Hydroxy 48.8 30.0 - 100.0 ng/mL    Comment: Vitamin D  deficiency has been defined by the Institute of Medicine and an Endocrine Society practice guideline as a level of serum 25-OH vitamin D  less than 20 ng/mL (1,2). The Endocrine Society went on to further define vitamin D  insufficiency as a level between 21 and 29 ng/mL (2). 1. IOM (Institute of Medicine). 2010. Dietary reference    intakes for calcium  and D. [place]: The    [employer]. 2. Holick MF, Binkley Holliday, Bischoff-Ferrari HA, et al.    Evaluation, treatment, and prevention of vitamin D     deficiency: an Endocrine Society clinical practice    guideline. JCEM. 2011 Jul;  96(7):1911-30.   Hemoglobin A1c     Status: None   Collection Time: 01/03/25  8:59 AM  Result Value Ref Range   Hgb A1c MFr Bld 5.4 4.8 - 5.6 %    Comment:          Prediabetes: 5.7 - 6.4          Diabetes: >6.4          Glycemic control for adults with diabetes: <7.0    Est. average glucose Bld gHb Est-mCnc 108 mg/dL  C-reactive protein     Status: Abnormal   Collection Time: 01/03/25  8:59 AM  Result Value Ref Range   CRP 13 (H) 0 - 10 mg/L  Sedimentation rate     Status: None   Collection Time: 01/03/25  8:59 AM  Result Value Ref Range   Sed Rate 24 0 - 40 mm/hr  TSH     Status: None   Collection Time: 01/03/25  8:59 AM  Result Value Ref Range   TSH 0.731 0.450 - 4.500 uIU/mL    Diabetic Foot Exam:     PHQ2/9:    01/14/2025    1:58 PM 12/30/2024   12:52 PM 10/01/2024    2:42 PM 08/21/2024    9:00 AM 05/08/2024   11:11 AM  Depression screen PHQ 2/9  Decreased Interest 0 0 0 0 0  Down, Depressed, Hopeless 0 0 0 0 0  PHQ - 2 Score 0 0 0 0 0    phq 9 is negative  Fall Risk:    01/14/2025    1:58 PM 12/30/2024   12:51 PM 10/01/2024    2:41 PM 08/21/2024    9:00 AM 05/08/2024   11:11 AM  Fall Risk   Falls in the past year? 0 0 0 0 0  Number falls in past yr: 0 0 0 0 0  Injury with Fall? 0 0 0  0  0   Risk for fall due to : No Fall Risks No Fall Risks No Fall Risks No Fall Risks No Fall Risks  Follow up Falls evaluation completed Falls evaluation completed Falls evaluation completed Falls evaluation completed Falls prevention discussed;Education provided;Falls evaluation completed     Data saved with a previous flowsheet row definition     Assessment and Plan Assessment & Plan Obesity Weight reduced to 189.5 lbs with Wegovy . BMI 30.59. No significant side effects. Goal weight  175 lbs. - Increased Wegovy  to 2.4 mg. - Provided three-month Wegovy  supply. - Advised to maintain nutritional intake. - Scheduled follow-up in three months.  Fibromyalgia Symptoms  manageable with loxapine and pregabalin . Worsen in winter but controlled. - Continue loxapine and pregabalin . - Refilled pregabalin  prescription.  Hashimoto's thyroiditis Well-controlled with Cytomel  and levothyroxine . Stable TSH levels. - Continue current thyroid  medication regimen. - Continue supplements as tolerated.  Nocturnal hypoxemia Managed with nocturnal oxygen. Weight loss expected to improve condition. - Continue nocturnal oxygen therapy.  Mild persistent asthma Managed with preventative inhaler and albuterol . Weight loss expected to improve symptoms. - Continue current asthma management plan.  Chronic fatigue syndrome Symptoms improved, possibly due to weight loss and better control of Hashimoto's. - Continue current management and monitor symptoms.  Prediabetes Managed with lifestyle modifications and Wegovy . Discontinued metformin . - Continue lifestyle modifications.  Vitamin D  deficiency Managed with supplementation. Levels adequate. - Refilled vitamin D  prescription.  Chronic insomnia Managed with trazodone , effective for sleep maintenance. - Refilled trazodone  prescription.        [1]  Current Outpatient Medications:    albuterol  (VENTOLIN  HFA) 108 (90 Base) MCG/ACT inhaler, Inhale 1-2 puffs into the lungs every 6 (six) hours as needed for wheezing or shortness of breath (or cough)., Disp: 8 g, Rfl: 12   azelastine  (ASTELIN ) 0.1 % nasal spray, Place 1 spray into both nostrils 2 (two) times daily. Use in each nostril as directed, Disp: 30 mL, Rfl: 2   budesonide -formoterol  (SYMBICORT ) 160-4.5 MCG/ACT inhaler, Inhale 2 puffs into the lungs 2 (two) times daily., Disp: 1 each, Rfl: 12   cetirizine (ZYRTEC) 10 MG tablet, Take 20 mg by mouth daily., Disp: , Rfl:    DULoxetine  (CYMBALTA ) 60 MG capsule, Take 1 capsule (60 mg total) by mouth daily., Disp: 90 capsule, Rfl: 1   fluticasone  (FLONASE ) 50 MCG/ACT nasal spray, Place 2 sprays into both nostrils daily.,  Disp: 1 g, Rfl: 8   levothyroxine  (SYNTHROID ) 75 MCG tablet, Take 75 mcg by mouth daily before breakfast., Disp: , Rfl:    liothyronine  (CYTOMEL ) 5 MCG tablet, Take 5 mcg by mouth daily., Disp: , Rfl:    Magnesium  Glycinate 100 MG CAPS, Take 1 capsule by mouth daily at 12 noon., Disp: 100 capsule, Rfl: 1   metFORMIN  (GLUCOPHAGE -XR) 500 MG 24 hr tablet, Take 1 tablet (500 mg total) by mouth daily with breakfast., Disp: 90 tablet, Rfl: 1   pregabalin  (LYRICA ) 75 MG capsule, TAKE 1 CAPSULE BY MOUTH EVERY MORNING AND IN EVENING AND 2 AT BEDTIME, Disp: 360 capsule, Rfl: 0   Selenium 200 MCG CAPS, Take 200 mcg by mouth daily., Disp: , Rfl:    traZODone  (DESYREL ) 100 MG tablet, Take 1 tablet (100 mg total) by mouth at bedtime., Disp: 90 tablet, Rfl: 1   Vitamin D , Ergocalciferol , (DRISDOL ) 1.25 MG (50000 UNIT) CAPS capsule, TAKE 1 CAPSULE BY MOUTH 1 TIME A WEEK, Disp: 12 capsule, Rfl: 0   WEGOVY  1.7 MG/0.75ML SOAJ SQ injection, INJECT 1.7 MG UNDER THE SKIN ONCE A WEEK, Disp: 3 mL, Rfl: 0 [2]  Allergies Allergen Reactions   Statins     myalgia

## 2025-01-15 ENCOUNTER — Telehealth: Payer: Self-pay

## 2025-01-15 NOTE — Telephone Encounter (Signed)
 Please clarify:  Take 1 capsule (75 mg total) by mouth 3 (three) times daily.  One in am , one in pm and two at night

## 2025-01-15 NOTE — Telephone Encounter (Signed)
 Left detailed vm

## 2025-01-15 NOTE — Telephone Encounter (Signed)
 Copied from CRM #[id number]. Topic: Clinical - Medication Question >> Jan 14, 2025  4:53 PM Everette C wrote: Reason for CRM: The patient's pharmacy has called to request prescription clarity regarding their recently received prescription of pregabalin  (LYRICA ) 75 MG capsule [[id number]] when possible

## 2025-01-17 ENCOUNTER — Encounter: Admitting: Family Medicine

## 2025-01-20 ENCOUNTER — Ambulatory Visit: Payer: Self-pay | Admitting: Family Medicine

## 2025-01-21 ENCOUNTER — Telehealth: Payer: Self-pay | Admitting: Pharmacy Technician

## 2025-01-21 ENCOUNTER — Other Ambulatory Visit (HOSPITAL_COMMUNITY): Payer: Self-pay

## 2025-01-21 NOTE — Telephone Encounter (Signed)
 Pharmacy Patient Advocate Encounter   Received notification from [facility] that prior authorization for Pregabalin  75MG  capsules  is required/requested.   Insurance verification completed.   The patient is insured through [facility].   Per test claim: PA required; PA started via CoverMyMeds. KEY [id number] . Waiting for clinical questions to populate.

## 2025-01-21 NOTE — Telephone Encounter (Signed)
 Pharmacy Patient Advocate Encounter  Received notification from [employer] that Prior Authorization for Pregabalin  75MG  capsules has been APPROVED from 01/21/25 to 07/21/25. Ran test claim, Copay is $7.87. This test claim was processed through [facility]- copay amounts may vary at other pharmacies due to pharmacy/plan contracts, or as the patient moves through the different stages of their insurance plan.   PA #/Case ID/Reference #: [id number]

## 2025-01-23 LAB — ANA,IFA RA DIAG PNL W/RFLX TIT/PATN
ANA Titer 1: POSITIVE — AB
Cyclic Citrullin Peptide Ab: 7 U (ref 0–19)
Rheumatoid fact SerPl-aCnc: <10 [IU]/mL

## 2025-01-23 LAB — FANA STAINING PATTERNS: Speckled Pattern: 1:320 {titer} — ABNORMAL HIGH

## 2025-01-23 LAB — IGG, IGA, IGM
IgG (Immunoglobin G), Serum: 984 mg/dL (ref 586–1602)
IgM (Immunoglobulin M), Srm: 281 mg/dL — ABNORMAL HIGH (ref 26–217)
Immunoglobulin A, (IgA) QN, Serum: 135 mg/dL (ref 87–352)

## 2025-04-15 ENCOUNTER — Ambulatory Visit: Admitting: Family Medicine

## 2026-01-01 ENCOUNTER — Encounter: Admitting: Family Medicine
